# Patient Record
Sex: Male | Born: 1950 | Race: Black or African American | Hispanic: No | Marital: Single
Health system: Southern US, Community
[De-identification: ages and names within clinical notes are randomized; demographics above are authoritative.]

## PROBLEM LIST (undated history)

## (undated) DIAGNOSIS — F32A Depression, unspecified: Secondary | ICD-10-CM

## (undated) DIAGNOSIS — I639 Cerebral infarction, unspecified: Secondary | ICD-10-CM

## (undated) DIAGNOSIS — D649 Anemia, unspecified: Secondary | ICD-10-CM

## (undated) DIAGNOSIS — F329 Major depressive disorder, single episode, unspecified: Secondary | ICD-10-CM

## (undated) DIAGNOSIS — I10 Essential (primary) hypertension: Secondary | ICD-10-CM

## (undated) DIAGNOSIS — H5440 Blindness, one eye, unspecified eye: Secondary | ICD-10-CM

## (undated) DIAGNOSIS — I509 Heart failure, unspecified: Secondary | ICD-10-CM

## (undated) DIAGNOSIS — R569 Unspecified convulsions: Secondary | ICD-10-CM

## (undated) DIAGNOSIS — E119 Type 2 diabetes mellitus without complications: Secondary | ICD-10-CM

## (undated) HISTORY — DX: Blindness, one eye, unspecified eye: H54.40

## (undated) HISTORY — DX: Type 2 diabetes mellitus without complications: E11.9

## (undated) HISTORY — DX: Unspecified convulsions: R56.9

## (undated) HISTORY — DX: Heart failure, unspecified: I50.9

## (undated) HISTORY — DX: Depression, unspecified: F32.A

## (undated) HISTORY — DX: Anemia, unspecified: D64.9

## (undated) HISTORY — DX: Essential (primary) hypertension: I10

## (undated) HISTORY — PX: PEG TUBE PLACEMENT: SUR1034

## (undated) HISTORY — DX: Cerebral infarction, unspecified: I63.9

## (undated) HISTORY — DX: Major depressive disorder, single episode, unspecified: F32.9

---

## 2009-12-17 ENCOUNTER — Ambulatory Visit: Payer: Self-pay | Admitting: Thoracic Surgery (Cardiothoracic Vascular Surgery)

## 2015-02-15 ENCOUNTER — Non-Acute Institutional Stay (SKILLED_NURSING_FACILITY): Payer: Medicare Other | Admitting: Adult Health

## 2015-02-15 ENCOUNTER — Encounter: Payer: Self-pay | Admitting: *Deleted

## 2015-02-15 ENCOUNTER — Encounter: Payer: Self-pay | Admitting: Adult Health

## 2015-02-15 DIAGNOSIS — R1310 Dysphagia, unspecified: Secondary | ICD-10-CM

## 2015-02-15 DIAGNOSIS — Z8673 Personal history of transient ischemic attack (TIA), and cerebral infarction without residual deficits: Secondary | ICD-10-CM

## 2015-02-15 DIAGNOSIS — Z794 Long term (current) use of insulin: Secondary | ICD-10-CM

## 2015-02-15 DIAGNOSIS — I82401 Acute embolism and thrombosis of unspecified deep veins of right lower extremity: Secondary | ICD-10-CM

## 2015-02-15 DIAGNOSIS — F32A Depression, unspecified: Secondary | ICD-10-CM

## 2015-02-15 DIAGNOSIS — I10 Essential (primary) hypertension: Secondary | ICD-10-CM

## 2015-02-15 DIAGNOSIS — E785 Hyperlipidemia, unspecified: Secondary | ICD-10-CM | POA: Diagnosis not present

## 2015-02-15 DIAGNOSIS — R569 Unspecified convulsions: Secondary | ICD-10-CM | POA: Diagnosis not present

## 2015-02-15 DIAGNOSIS — E119 Type 2 diabetes mellitus without complications: Secondary | ICD-10-CM | POA: Diagnosis not present

## 2015-02-15 DIAGNOSIS — F329 Major depressive disorder, single episode, unspecified: Secondary | ICD-10-CM | POA: Diagnosis not present

## 2015-02-15 DIAGNOSIS — I5022 Chronic systolic (congestive) heart failure: Secondary | ICD-10-CM | POA: Diagnosis not present

## 2015-02-16 ENCOUNTER — Non-Acute Institutional Stay (SKILLED_NURSING_FACILITY): Payer: Medicare Other | Admitting: Internal Medicine

## 2015-02-16 DIAGNOSIS — E785 Hyperlipidemia, unspecified: Secondary | ICD-10-CM

## 2015-02-16 DIAGNOSIS — Z794 Long term (current) use of insulin: Secondary | ICD-10-CM

## 2015-02-16 DIAGNOSIS — IMO0002 Reserved for concepts with insufficient information to code with codable children: Secondary | ICD-10-CM

## 2015-02-16 DIAGNOSIS — K651 Peritoneal abscess: Secondary | ICD-10-CM | POA: Diagnosis not present

## 2015-02-16 DIAGNOSIS — E46 Unspecified protein-calorie malnutrition: Secondary | ICD-10-CM | POA: Diagnosis not present

## 2015-02-16 DIAGNOSIS — L89151 Pressure ulcer of sacral region, stage 1: Secondary | ICD-10-CM | POA: Diagnosis not present

## 2015-02-16 DIAGNOSIS — Z79899 Other long term (current) drug therapy: Secondary | ICD-10-CM

## 2015-02-16 DIAGNOSIS — Z789 Other specified health status: Secondary | ICD-10-CM

## 2015-02-16 DIAGNOSIS — Z9189 Other specified personal risk factors, not elsewhere classified: Secondary | ICD-10-CM

## 2015-02-16 DIAGNOSIS — R5381 Other malaise: Secondary | ICD-10-CM

## 2015-02-16 DIAGNOSIS — E1159 Type 2 diabetes mellitus with other circulatory complications: Secondary | ICD-10-CM

## 2015-02-16 DIAGNOSIS — G9340 Encephalopathy, unspecified: Secondary | ICD-10-CM | POA: Diagnosis not present

## 2015-02-16 DIAGNOSIS — I5020 Unspecified systolic (congestive) heart failure: Secondary | ICD-10-CM

## 2015-02-16 DIAGNOSIS — L98429 Non-pressure chronic ulcer of back with unspecified severity: Secondary | ICD-10-CM

## 2015-02-16 DIAGNOSIS — I10 Essential (primary) hypertension: Secondary | ICD-10-CM

## 2015-02-16 DIAGNOSIS — I69391 Dysphagia following cerebral infarction: Secondary | ICD-10-CM

## 2015-02-16 DIAGNOSIS — I824Z1 Acute embolism and thrombosis of unspecified deep veins of right distal lower extremity: Secondary | ICD-10-CM

## 2015-02-16 DIAGNOSIS — I639 Cerebral infarction, unspecified: Secondary | ICD-10-CM

## 2015-02-16 DIAGNOSIS — F0631 Mood disorder due to known physiological condition with depressive features: Secondary | ICD-10-CM

## 2015-02-16 DIAGNOSIS — G40909 Epilepsy, unspecified, not intractable, without status epilepticus: Secondary | ICD-10-CM

## 2015-02-16 NOTE — Progress Notes (Signed)
Patient ID: Dalton Ferguson, male   DOB: 25-Oct-1950, 64 y.o.   MRN: [id number]     [facility]   PCP: No primary care provider on file.  Code Status: full code  No Known Allergies  Chief Complaint  Patient presents with  . New Admit To SNF     HPI:  64 y.o. patient is here for short term rehabilitation from [facility]. He was there from 01/22/15-02/14/15 with generalized weakness, acute respiratory failure, poor nutrition and wounds. 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 TEE showed EF of 30-35%, carotid ultrasound did not show significant stenosis and arrythmia was ruled out. His stroke was felt to be cardioembolic in origin and he was started on anticoagulation. Further workup revelaed right lower extremity DVT. He had significant encephalopathy and was diagnosed to have seizure as well. He had a peg tube in place for nutritional support and later developed abscess around it which had to be drained. He was on vapotherm at 18 ml/min with fio2 35% on transfer to [facility] where he was then weaned down further to room air. He remained encephalopathic at [facility]. He is seen in his room today. He is unable to participate in HPI and ROS. He is on peg tube feed. He has PMH of DM, HLD, CAD, CHF and left eye blindness.   Review of Systems:  Unable to obtain  Past Medical History  Diagnosis Date  . Diabetes mellitus without complication (HCC)   . CHF (congestive heart failure) (HCC)   . Anemia   . Stroke (HCC)   . Blindness of left eye   . Hypertension   . Depression   . Seizures [facility])    Past Surgical History  Procedure Laterality Date  . Peg tube placement     Social History:   has no tobacco, alcohol, and drug history on file.  No family history on file.  Medications:     Medication List       This list is accurate as of: 02/16/15  9:45 AM.  Always use your most recent med list.               atorvastatin 80 MG tablet  Commonly known as:  LIPITOR  80 mg by PEG Tube route at bedtime.     ELIQUIS 5 MG Tabs tablet  Generic drug:  apixaban  5 mg by PEG Tube route 2 (two) times daily.     famotidine 20 MG tablet  Commonly known as:  PEPCID  20 mg by PEG Tube route 2 (two) times daily.     FLUoxetine 40 MG capsule  Commonly known as:  PROZAC  40 mg by PEG Tube route daily.     hydrALAZINE 25 MG tablet  Commonly known as:  APRESOLINE  25 mg by PEG Tube route 3 (three) times daily.     isosorbide dinitrate 10 MG tablet  Commonly known as:  ISORDIL  10 mg by PEG Tube route 3 (three) times daily.     LACTOBACILLUS PO  2 tablets by PEG Tube route 2 (two) times daily.     LANTUS [place]  Inject 40 Units into the skin at bedtime.     levETIRAcetam 500 MG tablet  Commonly known as:  KEPPRA  500 mg by PEG Tube route 2 (two) times daily.  Metoprolol Tartrate 75 MG Tabs  1 tablet by PEG Tube route every 8 (eight) hours.         Physical Exam: Filed Vitals:   02/16/15 0944  BP: 136/82  Pulse: 70  Temp: 98.3 F (36.8 C)  Resp: 16  SpO2: 98%    General- elderly male, in no acute distress Head- normocephalic, atraumatic Nose- normal nasal mucosa, no maxillary or frontal sinus tenderness, no nasal discharge Throat- moist mucus membrane, halitosis  Eyes- no pallor, no icterus, no discharge, normal conjunctiva, normal sclera Neck- no cervical lymphadenopathy Cardiovascular- normal s1,s2, systolic murmurs, trace leg edema Respiratory- bilateral decreased air entry to bases, no wheeze, no rhonchi, no crackles, no use of accessory muscles Abdomen- bowel sounds present, soft, non tender, peg tube in place Musculoskeletal- unable to assess as not following commands Neurological- unable to assess Skin- warm and dry, 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 Skin care. Encourage to be out of bed. Poor overall prognosis given his ongoing encephalopathy. If no improvement with therapy team, will need to review goals of care and get palliative care consult  Encephalopathy Ongoing. To work with SLP for cognition as tolerated. Monitor labs  Acute CVA Continue BP medication, statin and elqiuis for now. Remains encephalopathic. Has poor nutrition. monitor  Protein calorie malnutrition Continue jevity tube feed and continue peg site care, monitor weight, dietary team to be working with the patient. Add air mattress given his decreased mobility and continue pressure ulcer prophylaxis  Dysphagia Aspiration precautions, tube feed for now, oral hygiene  Skin abscess Has been drainaed and packing in place, continue daily wound care for now  Poor oral hygiene Add peridex mouthwash and to provide oral hygiene bid  HTN Stable bp reading. Continue hydralazine 25 mg tid, isosorbide dinitrate 10 mg tid and metoprolol 75 mg tid and monitor BP  CHF EF 30-35%, currently appears euvolemic. Continue isosorbide dinitrate, hydralazine, metoprolol  Right lower extremity DVT Continue eliquis 5 mg bid  HLD Continue atorvastatin 80 mg daily  Stress ulcer prophylaxis Continue famotidine 20 mg bid  Depression Continue fluoxetine 40 mg daily  Stage 1 sacral wound Continue wound care and pressure ulcer prophylaxis  Seizure disorder No recent seizure. Continue keppra 500 mg bid  DM monitor cbg, continue lantus 40 u at bedtime  Goals of care:  short term rehabilitation but possible long term care   Labs/tests ordered: cbc, cmp, a1c, lipid panel  Family/ staff Communication: reviewed care plan with nursing supervisor    Oneal Grout, MD  [facility] [phone] (Monday-Friday 8 am - 5 pm) [phone] (afterhours)

## 2015-05-06 DIAGNOSIS — G8929 Other chronic pain: Secondary | ICD-10-CM | POA: Diagnosis not present

## 2015-05-06 DIAGNOSIS — G40909 Epilepsy, unspecified, not intractable, without status epilepticus: Secondary | ICD-10-CM | POA: Diagnosis not present

## 2015-05-06 DIAGNOSIS — M6259 Muscle wasting and atrophy, not elsewhere classified, multiple sites: Secondary | ICD-10-CM | POA: Diagnosis not present

## 2015-05-06 DIAGNOSIS — G8194 Hemiplegia, unspecified affecting left nondominant side: Secondary | ICD-10-CM | POA: Diagnosis not present

## 2015-05-06 DIAGNOSIS — I69391 Dysphagia following cerebral infarction: Secondary | ICD-10-CM | POA: Diagnosis not present

## 2015-05-06 DIAGNOSIS — Z5189 Encounter for other specified aftercare: Secondary | ICD-10-CM | POA: Diagnosis not present

## 2015-05-06 DIAGNOSIS — I63511 Cerebral infarction due to unspecified occlusion or stenosis of right middle cerebral artery: Secondary | ICD-10-CM | POA: Diagnosis not present

## 2015-05-06 DIAGNOSIS — I69328 Other speech and language deficits following cerebral infarction: Secondary | ICD-10-CM | POA: Diagnosis not present

## 2015-05-06 DIAGNOSIS — R278 Other lack of coordination: Secondary | ICD-10-CM | POA: Diagnosis not present

## 2015-05-06 DIAGNOSIS — R5381 Other malaise: Secondary | ICD-10-CM | POA: Diagnosis not present

## 2015-05-06 DIAGNOSIS — L8942 Pressure ulcer of contiguous site of back, buttock and hip, stage 2: Secondary | ICD-10-CM | POA: Diagnosis not present

## 2015-05-06 DIAGNOSIS — E785 Hyperlipidemia, unspecified: Secondary | ICD-10-CM | POA: Diagnosis not present

## 2015-05-06 DIAGNOSIS — M6281 Muscle weakness (generalized): Secondary | ICD-10-CM | POA: Diagnosis not present

## 2015-05-06 DIAGNOSIS — M6249 Contracture of muscle, multiple sites: Secondary | ICD-10-CM | POA: Diagnosis not present

## 2015-05-06 DIAGNOSIS — Z8673 Personal history of transient ischemic attack (TIA), and cerebral infarction without residual deficits: Secondary | ICD-10-CM | POA: Diagnosis not present

## 2015-05-06 DIAGNOSIS — E1159 Type 2 diabetes mellitus with other circulatory complications: Secondary | ICD-10-CM | POA: Diagnosis not present

## 2015-05-06 DIAGNOSIS — R262 Difficulty in walking, not elsewhere classified: Secondary | ICD-10-CM | POA: Diagnosis not present

## 2015-05-06 DIAGNOSIS — I10 Essential (primary) hypertension: Secondary | ICD-10-CM | POA: Diagnosis not present

## 2015-05-06 DIAGNOSIS — I824Z1 Acute embolism and thrombosis of unspecified deep veins of right distal lower extremity: Secondary | ICD-10-CM | POA: Diagnosis not present

## 2015-05-06 DIAGNOSIS — L89159 Pressure ulcer of sacral region, unspecified stage: Secondary | ICD-10-CM | POA: Diagnosis not present

## 2015-05-06 DIAGNOSIS — M79609 Pain in unspecified limb: Secondary | ICD-10-CM | POA: Diagnosis not present

## 2015-05-06 DIAGNOSIS — I69320 Aphasia following cerebral infarction: Secondary | ICD-10-CM | POA: Diagnosis not present

## 2015-05-06 DIAGNOSIS — J9601 Acute respiratory failure with hypoxia: Secondary | ICD-10-CM | POA: Diagnosis not present

## 2015-05-06 DIAGNOSIS — I5020 Unspecified systolic (congestive) heart failure: Secondary | ICD-10-CM | POA: Diagnosis not present

## 2015-05-06 DIAGNOSIS — G8191 Hemiplegia, unspecified affecting right dominant side: Secondary | ICD-10-CM | POA: Diagnosis not present

## 2015-05-06 DIAGNOSIS — I63431 Cerebral infarction due to embolism of right posterior cerebral artery: Secondary | ICD-10-CM | POA: Diagnosis not present

## 2015-05-06 DIAGNOSIS — I63512 Cerebral infarction due to unspecified occlusion or stenosis of left middle cerebral artery: Secondary | ICD-10-CM | POA: Diagnosis not present

## 2015-05-06 DIAGNOSIS — G9340 Encephalopathy, unspecified: Secondary | ICD-10-CM | POA: Diagnosis not present

## 2015-05-06 DIAGNOSIS — F329 Major depressive disorder, single episode, unspecified: Secondary | ICD-10-CM | POA: Diagnosis not present

## 2015-05-06 DIAGNOSIS — E46 Unspecified protein-calorie malnutrition: Secondary | ICD-10-CM | POA: Diagnosis not present

## 2015-05-06 DIAGNOSIS — I63412 Cerebral infarction due to embolism of left middle cerebral artery: Secondary | ICD-10-CM | POA: Diagnosis not present

## 2015-05-06 DIAGNOSIS — I63411 Cerebral infarction due to embolism of right middle cerebral artery: Secondary | ICD-10-CM | POA: Diagnosis not present

## 2015-05-06 DIAGNOSIS — I639 Cerebral infarction, unspecified: Secondary | ICD-10-CM | POA: Diagnosis not present

## 2015-05-06 DIAGNOSIS — L89151 Pressure ulcer of sacral region, stage 1: Secondary | ICD-10-CM | POA: Diagnosis not present

## 2015-05-06 DIAGNOSIS — D638 Anemia in other chronic diseases classified elsewhere: Secondary | ICD-10-CM | POA: Diagnosis not present

## 2015-05-08 DIAGNOSIS — G9340 Encephalopathy, unspecified: Secondary | ICD-10-CM | POA: Diagnosis not present

## 2015-05-08 DIAGNOSIS — I69320 Aphasia following cerebral infarction: Secondary | ICD-10-CM | POA: Diagnosis not present

## 2015-05-08 DIAGNOSIS — I63411 Cerebral infarction due to embolism of right middle cerebral artery: Secondary | ICD-10-CM | POA: Diagnosis not present

## 2015-05-08 DIAGNOSIS — R5381 Other malaise: Secondary | ICD-10-CM | POA: Diagnosis not present

## 2015-05-08 DIAGNOSIS — Z5189 Encounter for other specified aftercare: Secondary | ICD-10-CM | POA: Diagnosis not present

## 2015-05-08 DIAGNOSIS — I63412 Cerebral infarction due to embolism of left middle cerebral artery: Secondary | ICD-10-CM | POA: Diagnosis not present

## 2015-05-08 DIAGNOSIS — L89151 Pressure ulcer of sacral region, stage 1: Secondary | ICD-10-CM | POA: Diagnosis not present

## 2015-05-09 ENCOUNTER — Encounter: Payer: Self-pay | Admitting: Adult Health

## 2015-05-09 ENCOUNTER — Non-Acute Institutional Stay (SKILLED_NURSING_FACILITY): Payer: Medicare Other | Admitting: Adult Health

## 2015-05-09 DIAGNOSIS — D638 Anemia in other chronic diseases classified elsewhere: Secondary | ICD-10-CM | POA: Diagnosis not present

## 2015-05-09 DIAGNOSIS — I5020 Unspecified systolic (congestive) heart failure: Secondary | ICD-10-CM | POA: Diagnosis not present

## 2015-05-09 DIAGNOSIS — Z8673 Personal history of transient ischemic attack (TIA), and cerebral infarction without residual deficits: Secondary | ICD-10-CM | POA: Diagnosis not present

## 2015-05-09 DIAGNOSIS — I824Z1 Acute embolism and thrombosis of unspecified deep veins of right distal lower extremity: Secondary | ICD-10-CM

## 2015-05-09 DIAGNOSIS — E785 Hyperlipidemia, unspecified: Secondary | ICD-10-CM

## 2015-05-09 DIAGNOSIS — I69391 Dysphagia following cerebral infarction: Secondary | ICD-10-CM | POA: Diagnosis not present

## 2015-05-09 DIAGNOSIS — L89159 Pressure ulcer of sacral region, unspecified stage: Secondary | ICD-10-CM | POA: Diagnosis not present

## 2015-05-09 DIAGNOSIS — IMO0002 Reserved for concepts with insufficient information to code with codable children: Secondary | ICD-10-CM

## 2015-05-09 DIAGNOSIS — F0631 Mood disorder due to known physiological condition with depressive features: Secondary | ICD-10-CM

## 2015-05-09 DIAGNOSIS — E1159 Type 2 diabetes mellitus with other circulatory complications: Secondary | ICD-10-CM | POA: Diagnosis not present

## 2015-05-09 DIAGNOSIS — Z794 Long term (current) use of insulin: Secondary | ICD-10-CM

## 2015-05-09 DIAGNOSIS — E46 Unspecified protein-calorie malnutrition: Secondary | ICD-10-CM

## 2015-05-09 DIAGNOSIS — I639 Cerebral infarction, unspecified: Secondary | ICD-10-CM

## 2015-05-09 DIAGNOSIS — I10 Essential (primary) hypertension: Secondary | ICD-10-CM | POA: Diagnosis not present

## 2015-05-09 DIAGNOSIS — G40909 Epilepsy, unspecified, not intractable, without status epilepticus: Secondary | ICD-10-CM | POA: Diagnosis not present

## 2015-05-09 NOTE — Progress Notes (Addendum)
Patient ID: Dalton Ferguson, male   DOB: 1950-11-24, 65 y.o.   MRN: [id number]    DATE:  05/09/2015   MRN:  [id number]  BIRTHDAY: 1950/09/24  Facility:  Nursing Home Location:  [facility]  Nursing Home Room Number: 506-1  LEVEL OF CARE:  SNF [phone])  Contact Information    Name Relation Home Work [place]  [phone]     [facility]  [phone]     Towanda Malkin Daughter   [phone]   Tollie Pizza Daughter   [phone]        Chief Complaint  Patient presents with  . Medical Management of Chronic Issues    CVA history, protein calorie malnutrition, dysphagia, DVT RLE, pressure ulcer sacral region, hyperlipidemia, hypertension, systolic CHF, seizure, major depression, diabetes mellitus type 2 and anemia of chronic disease    HISTORY OF PRESENT ILLNESS:  This is a 65 year old male who is being seen for a routine visit. He is a long-term care resident of [facility]. He has been admitted to [facility] on 02/14/15. He has been admitted from [facility]. Patient has been having watery stools causing moisture on sacral area. He is on continuous tube feedings and needing HOB elevated causing pressure on sacral area. No seizure episode and currently on Keppra. Latest hgbA1c 7.1 andcurrently on Lantus 40 units SQ daily.  PAST MEDICAL HISTORY:  Past Medical History  Diagnosis Date  . Diabetes mellitus without complication (HCC)   . CHF (congestive heart failure) (HCC)   . Anemia   . Stroke (HCC)   . Blindness of left eye   . Hypertension   . Depression   . Seizures (HCC)      CURRENT MEDICATIONS: Reviewed  Patient's Medications  New Prescriptions   No medications on file  Previous Medications   APIXABAN (ELIQUIS) 5 MG TABS TABLET    5 mg by PEG Tube route 2 (two) times daily.   ATORVASTATIN (LIPITOR) 80 MG TABLET    80 mg by PEG Tube route at bedtime.   FAMOTIDINE (PEPCID) 20 MG TABLET    20 mg by PEG Tube route 2  (two) times daily.   FLUOXETINE (PROZAC) 20 MG/5ML SOLUTION    Place 40 mg into feeding tube daily. Take 40 mg/10 ml via peg daily   HYDRALAZINE (APRESOLINE) 25 MG TABLET    25 mg by PEG Tube route 3 (three) times daily.   INSULIN GLARGINE (LANTUS [place])    Inject 40 Units into the skin at bedtime.   ISOSORBIDE DINITRATE (ISORDIL) 10 MG TABLET    10 mg by PEG Tube route 3 (three) times daily.   LACTOBACILLUS PO    2 tablets by PEG Tube route 2 (two) times daily.   LEVETIRACETAM (KEPPRA) 500 MG TABLET    500 mg by PEG Tube route 2 (two) times daily.   METOPROLOL TARTRATE 75 MG TABS    1 tablet by PEG Tube route every 8 (eight) hours.  Modified Medications   No medications on file  Discontinued Medications   FLUOXETINE (PROZAC) 40 MG CAPSULE    40 mg by PEG Tube route daily.     No Known Allergies   REVIEW OF SYSTEMS:  Unable to obtain due to patient being nonverbal  PHYSICAL EXAMINATION  GENERAL APPEARANCE: In no acute distress.  SKIN:  Sacral pressure ulcer; left eye is blind HEAD: Normal in size and contour. No evidence of trauma EYES: right pupil is reactive to light, left  eye is blind EARS: Pinnae are normal. Patient hears normal voice tunes of the examiner MOUTH and THROAT: Lips are without lesions. Oral mucosa is moist and without lesions. Tongue is normal in shape, size, and color and without lesions NECK: supple, trachea midline, no neck masses, no thyroid tenderness, no thyromegaly LYMPHATICS: no LAN in the neck, no supraclavicular LAN RESPIRATORY: breathing is even & unlabored, BS CTAB CARDIAC: RRR, no murmur,no extra heart sounds, no edema GI: abdomen soft, normal BS, no masses, no tenderness, no hepatomegaly, no splenomegaly EXTREMITIES:  Able to move LUE from elbow only; not able to move BLE and RUE; contracted right hand PSYCHIATRIC: Affect and behavior are appropriate  LABS/RADIOLOGY: Labs reviewed: 03/06/15  iron, total 40  iron, unsaturated 154  total iron binding  capacity 194  saturation iron 21 folate acid 10.95 vitamin B12 851 02/16/15  cholesterol 117 HDL 23.7 LDL 57 triglycerides 180 WBC 10.9 hemoglobin 8.2 hematocrit 26.6 MCV 96.0 platelet 311 sodium 141 potassium 4.9 glucose 291 BUN 31 creatinine 1.18 calcium 8.4 total protein 6.0 albumin 2.54 total bilirubin 0.32 alkaline phosphatase 228 SGOT 35 SGPT 110    ASSESSMENT/PLAN:  CVA history - continue Eliquis, metoprolol, isosorbide dinitrate and hydralazine  Dysphagia - continue tube feedings and aspiration precaution  Protein calorie malnutrition, severe - albumin 2.54; continue Prostat 30 mL twice a day and continue to feedings of Isosource 1.5 at 50 mL/hour; check CMP and RD consult regarding possibility of 16 hour tube feedings and 8 hours off so patient can lie flat on bed to relieve pressure on sacral area  DVT RLE - continue Eliquis 5 mg 1 tab twice a day  Sacral pressure ulcer - continue wound treatment  Hyperlipidemia - continue atorvastatin 80 mg 1 tab daily at bedtime  Hypertension - continue metoprolol tartrate 75 mg every 8 hours, hydralazine 25 mg 3 times a day and isosorbide dinitrate 10 mg 1 tab 3 times a day  Systolic CHF - continue hydralazine, isosorbide dinitrate and metoprolol  Seizure disorder - continue Keppra 100 mg/ML give 500 mg/5 ML twice a day  Major depression - mood is stable; continue Prozac 40 mg/10 mL via G-tube daily  Diabetes mellitus, type II - hemoglobin A1c 7.1; continue Lantus 40 units subcutaneous daily at bedtime  Anemia of chronic disease - hemoglobin 8.2; check CBC    Goals of care:  Long-term care   [facility], NP [facility] [phone]

## 2015-05-10 DIAGNOSIS — M6249 Contracture of muscle, multiple sites: Secondary | ICD-10-CM | POA: Diagnosis not present

## 2015-05-10 DIAGNOSIS — R262 Difficulty in walking, not elsewhere classified: Secondary | ICD-10-CM | POA: Diagnosis not present

## 2015-05-10 DIAGNOSIS — R5381 Other malaise: Secondary | ICD-10-CM | POA: Diagnosis not present

## 2015-05-10 DIAGNOSIS — G8194 Hemiplegia, unspecified affecting left nondominant side: Secondary | ICD-10-CM | POA: Diagnosis not present

## 2015-05-10 DIAGNOSIS — L8942 Pressure ulcer of contiguous site of back, buttock and hip, stage 2: Secondary | ICD-10-CM | POA: Diagnosis not present

## 2015-05-10 DIAGNOSIS — G8191 Hemiplegia, unspecified affecting right dominant side: Secondary | ICD-10-CM | POA: Diagnosis not present

## 2015-05-10 DIAGNOSIS — G8929 Other chronic pain: Secondary | ICD-10-CM | POA: Diagnosis not present

## 2015-05-10 DIAGNOSIS — M6259 Muscle wasting and atrophy, not elsewhere classified, multiple sites: Secondary | ICD-10-CM | POA: Diagnosis not present

## 2015-05-10 DIAGNOSIS — I63511 Cerebral infarction due to unspecified occlusion or stenosis of right middle cerebral artery: Secondary | ICD-10-CM | POA: Diagnosis not present

## 2015-05-10 DIAGNOSIS — F329 Major depressive disorder, single episode, unspecified: Secondary | ICD-10-CM | POA: Diagnosis not present

## 2015-05-10 DIAGNOSIS — I63512 Cerebral infarction due to unspecified occlusion or stenosis of left middle cerebral artery: Secondary | ICD-10-CM | POA: Diagnosis not present

## 2015-05-10 DIAGNOSIS — M79609 Pain in unspecified limb: Secondary | ICD-10-CM | POA: Diagnosis not present

## 2015-05-16 DIAGNOSIS — M6259 Muscle wasting and atrophy, not elsewhere classified, multiple sites: Secondary | ICD-10-CM | POA: Diagnosis not present

## 2015-05-16 DIAGNOSIS — L8942 Pressure ulcer of contiguous site of back, buttock and hip, stage 2: Secondary | ICD-10-CM | POA: Diagnosis not present

## 2015-05-16 DIAGNOSIS — I63512 Cerebral infarction due to unspecified occlusion or stenosis of left middle cerebral artery: Secondary | ICD-10-CM | POA: Diagnosis not present

## 2015-05-16 DIAGNOSIS — F329 Major depressive disorder, single episode, unspecified: Secondary | ICD-10-CM | POA: Diagnosis not present

## 2015-05-16 DIAGNOSIS — M6249 Contracture of muscle, multiple sites: Secondary | ICD-10-CM | POA: Diagnosis not present

## 2015-05-16 DIAGNOSIS — M79609 Pain in unspecified limb: Secondary | ICD-10-CM | POA: Diagnosis not present

## 2015-05-16 DIAGNOSIS — G8929 Other chronic pain: Secondary | ICD-10-CM | POA: Diagnosis not present

## 2015-05-16 DIAGNOSIS — R5381 Other malaise: Secondary | ICD-10-CM | POA: Diagnosis not present

## 2015-05-16 DIAGNOSIS — R262 Difficulty in walking, not elsewhere classified: Secondary | ICD-10-CM | POA: Diagnosis not present

## 2015-05-16 DIAGNOSIS — I63511 Cerebral infarction due to unspecified occlusion or stenosis of right middle cerebral artery: Secondary | ICD-10-CM | POA: Diagnosis not present

## 2015-05-16 DIAGNOSIS — G8194 Hemiplegia, unspecified affecting left nondominant side: Secondary | ICD-10-CM | POA: Diagnosis not present

## 2015-05-16 DIAGNOSIS — G8191 Hemiplegia, unspecified affecting right dominant side: Secondary | ICD-10-CM | POA: Diagnosis not present

## 2015-05-18 DIAGNOSIS — F329 Major depressive disorder, single episode, unspecified: Secondary | ICD-10-CM | POA: Diagnosis not present

## 2015-05-18 DIAGNOSIS — M79609 Pain in unspecified limb: Secondary | ICD-10-CM | POA: Diagnosis not present

## 2015-05-18 DIAGNOSIS — I63512 Cerebral infarction due to unspecified occlusion or stenosis of left middle cerebral artery: Secondary | ICD-10-CM | POA: Diagnosis not present

## 2015-05-18 DIAGNOSIS — L8942 Pressure ulcer of contiguous site of back, buttock and hip, stage 2: Secondary | ICD-10-CM | POA: Diagnosis not present

## 2015-05-18 DIAGNOSIS — M6259 Muscle wasting and atrophy, not elsewhere classified, multiple sites: Secondary | ICD-10-CM | POA: Diagnosis not present

## 2015-05-18 DIAGNOSIS — R262 Difficulty in walking, not elsewhere classified: Secondary | ICD-10-CM | POA: Diagnosis not present

## 2015-05-18 DIAGNOSIS — G8191 Hemiplegia, unspecified affecting right dominant side: Secondary | ICD-10-CM | POA: Diagnosis not present

## 2015-05-18 DIAGNOSIS — G8929 Other chronic pain: Secondary | ICD-10-CM | POA: Diagnosis not present

## 2015-05-18 DIAGNOSIS — I63511 Cerebral infarction due to unspecified occlusion or stenosis of right middle cerebral artery: Secondary | ICD-10-CM | POA: Diagnosis not present

## 2015-05-18 DIAGNOSIS — R5381 Other malaise: Secondary | ICD-10-CM | POA: Diagnosis not present

## 2015-05-18 DIAGNOSIS — G8194 Hemiplegia, unspecified affecting left nondominant side: Secondary | ICD-10-CM | POA: Diagnosis not present

## 2015-05-18 DIAGNOSIS — M6249 Contracture of muscle, multiple sites: Secondary | ICD-10-CM | POA: Diagnosis not present

## 2015-05-20 DIAGNOSIS — L8942 Pressure ulcer of contiguous site of back, buttock and hip, stage 2: Secondary | ICD-10-CM | POA: Diagnosis not present

## 2015-05-20 DIAGNOSIS — G8191 Hemiplegia, unspecified affecting right dominant side: Secondary | ICD-10-CM | POA: Diagnosis not present

## 2015-05-20 DIAGNOSIS — M6249 Contracture of muscle, multiple sites: Secondary | ICD-10-CM | POA: Diagnosis not present

## 2015-05-20 DIAGNOSIS — M6259 Muscle wasting and atrophy, not elsewhere classified, multiple sites: Secondary | ICD-10-CM | POA: Diagnosis not present

## 2015-05-20 DIAGNOSIS — I63511 Cerebral infarction due to unspecified occlusion or stenosis of right middle cerebral artery: Secondary | ICD-10-CM | POA: Diagnosis not present

## 2015-05-20 DIAGNOSIS — G8194 Hemiplegia, unspecified affecting left nondominant side: Secondary | ICD-10-CM | POA: Diagnosis not present

## 2015-05-20 DIAGNOSIS — R5381 Other malaise: Secondary | ICD-10-CM | POA: Diagnosis not present

## 2015-05-20 DIAGNOSIS — M79609 Pain in unspecified limb: Secondary | ICD-10-CM | POA: Diagnosis not present

## 2015-05-20 DIAGNOSIS — G8929 Other chronic pain: Secondary | ICD-10-CM | POA: Diagnosis not present

## 2015-05-20 DIAGNOSIS — F329 Major depressive disorder, single episode, unspecified: Secondary | ICD-10-CM | POA: Diagnosis not present

## 2015-05-20 DIAGNOSIS — I63512 Cerebral infarction due to unspecified occlusion or stenosis of left middle cerebral artery: Secondary | ICD-10-CM | POA: Diagnosis not present

## 2015-05-20 DIAGNOSIS — R262 Difficulty in walking, not elsewhere classified: Secondary | ICD-10-CM | POA: Diagnosis not present

## 2015-05-22 ENCOUNTER — Non-Acute Institutional Stay (SKILLED_NURSING_FACILITY): Payer: Medicare Other | Admitting: Adult Health

## 2015-05-22 ENCOUNTER — Encounter: Payer: Self-pay | Admitting: Adult Health

## 2015-05-22 DIAGNOSIS — Z86718 Personal history of other venous thrombosis and embolism: Secondary | ICD-10-CM | POA: Insufficient documentation

## 2015-05-22 DIAGNOSIS — D638 Anemia in other chronic diseases classified elsewhere: Secondary | ICD-10-CM | POA: Diagnosis not present

## 2015-05-22 DIAGNOSIS — I5020 Unspecified systolic (congestive) heart failure: Secondary | ICD-10-CM | POA: Diagnosis not present

## 2015-05-22 DIAGNOSIS — I69391 Dysphagia following cerebral infarction: Secondary | ICD-10-CM

## 2015-05-22 DIAGNOSIS — G40909 Epilepsy, unspecified, not intractable, without status epilepticus: Secondary | ICD-10-CM

## 2015-05-22 DIAGNOSIS — I10 Essential (primary) hypertension: Secondary | ICD-10-CM | POA: Diagnosis not present

## 2015-05-22 DIAGNOSIS — Z8673 Personal history of transient ischemic attack (TIA), and cerebral infarction without residual deficits: Secondary | ICD-10-CM | POA: Insufficient documentation

## 2015-05-22 DIAGNOSIS — I824Z1 Acute embolism and thrombosis of unspecified deep veins of right distal lower extremity: Secondary | ICD-10-CM | POA: Diagnosis not present

## 2015-05-22 DIAGNOSIS — R569 Unspecified convulsions: Secondary | ICD-10-CM | POA: Insufficient documentation

## 2015-05-22 DIAGNOSIS — F32A Depression, unspecified: Secondary | ICD-10-CM | POA: Insufficient documentation

## 2015-05-22 DIAGNOSIS — E785 Hyperlipidemia, unspecified: Secondary | ICD-10-CM | POA: Diagnosis not present

## 2015-05-22 DIAGNOSIS — E1159 Type 2 diabetes mellitus with other circulatory complications: Secondary | ICD-10-CM

## 2015-05-22 DIAGNOSIS — I639 Cerebral infarction, unspecified: Secondary | ICD-10-CM | POA: Diagnosis not present

## 2015-05-22 DIAGNOSIS — F329 Major depressive disorder, single episode, unspecified: Secondary | ICD-10-CM | POA: Insufficient documentation

## 2015-05-22 DIAGNOSIS — F0631 Mood disorder due to known physiological condition with depressive features: Secondary | ICD-10-CM

## 2015-05-22 DIAGNOSIS — E46 Unspecified protein-calorie malnutrition: Secondary | ICD-10-CM | POA: Diagnosis not present

## 2015-05-22 DIAGNOSIS — Z794 Long term (current) use of insulin: Secondary | ICD-10-CM

## 2015-05-22 DIAGNOSIS — IMO0002 Reserved for concepts with insufficient information to code with codable children: Secondary | ICD-10-CM

## 2015-05-22 DIAGNOSIS — E119 Type 2 diabetes mellitus without complications: Secondary | ICD-10-CM | POA: Insufficient documentation

## 2015-05-22 DIAGNOSIS — L89159 Pressure ulcer of sacral region, unspecified stage: Secondary | ICD-10-CM

## 2015-05-22 NOTE — Progress Notes (Addendum)
Patient ID: Dalton Ferguson, male   DOB: [birth year], 65 y.o.   MRN: [id number]    DATE:    05/22/15  MRN:  [id number]  BIRTHDAY: 04/06/1951  Facility:  Nursing Home Location:  [facility]  Nursing Home Room Number: [id number]  LEVEL OF CARE:  SNF [phone])  Contact Information    Name Relation Home Work [place]  [phone]     [facility]  [phone]     Towanda Malkin Daughter   [phone]   Tollie Pizza Daughter   [phone]        Chief Complaint  Patient presents with  . Discharge Notes    CVA history, protein calorie malnutrition, dysphagia, DVT RLE, pressure ulcer sacral region, hyperlipidemia, hypertension, systolic CHF, seizure, major depression, diabetes mellitus type 2 and anemia of chronic disease    HISTORY OF PRESENT ILLNESS:  This is a 65 year old male who is for discharge home with Home health PT for endurance, OT for ADLs, CNA for showers, skilled nurse for disease management and social worker for advocacy. DME:  Michiel Sites lift, semi-electric hospital bed, gel overlay mattress, geri-chair, Diabetic source @75  cc/hour X 16 hours per day via pump (substitution permitted).  Daughter is adamant to take patient home and verbalized that she would be able to take care of his father. He has been admitted to [facility] on 02/14/15. He has been admitted from [facility]. He was there from 01/22/15-02/14/15 with generalized weakness, acute respiratory failure, poor nutrition and wounds. 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 TEE showed EF of 30-35%, carotid ultrasound did not show significant stenosis and arrythmia was ruled out. His stroke was felt to be cardioembolic in origin and he was started on anticoagulation. Further workup revelaed right lower extremity DVT. He had significant  encephalopathy and was diagnosed to have seizure as well. He had a peg tube in place for nutritional support.  He remained encephalopathic at [facility].   He has been admitted for a short-term rehabilitation and transitioned to long-term resident. He is bed-bound and is a total care.   PAST MEDICAL HISTORY:  Past Medical History  Diagnosis Date  . Diabetes mellitus without complication (HCC)   . CHF (congestive heart failure) (HCC)   . Anemia   . Stroke (HCC)   . Blindness of left eye   . Hypertension   . Depression   . Seizures (HCC)      CURRENT MEDICATIONS: Reviewed    Medication List       This list is accurate as of: 05/22/15  9:07 PM.  Always use your most recent med list.               atorvastatin 80 MG tablet  Commonly known as:  LIPITOR  80 mg by PEG Tube route at bedtime.     baclofen 10 MG tablet  Commonly known as:  [place]ES[place]  Take 10 mg by mouth 3 (three) times daily. Take 1/2 tab = 5 mg via PEG TID     ELIQUIS 5 MG Tabs tablet  Generic drug:  apixaban  5 mg by PEG Tube route 2 (two) times daily.     famotidine 20 MG tablet  Commonly known as:  PEPCID  20 mg by PEG Tube route 2 (two) times daily.     FLUoxetine 20 MG/5ML solution  Commonly known as:  PROZAC  Place 40 mg into feeding tube daily. Take 40 mg/10 ml via peg daily     hydrALAZINE 25 MG tablet  Commonly known as:  APRESOLINE  25 mg by PEG Tube route 3 (three) times daily.     isosorbide dinitrate 10 MG tablet  Commonly known as:  ISORDIL  10 mg by PEG Tube route 3 (three) times daily.     LACTOBACILLUS PO  2 tablets by PEG Tube route 2 (two) times daily.     LANTUS [place]  Inject 40 Units into the skin at bedtime.     levETIRAcetam 500 MG tablet  Commonly known as:  KEPPRA  500 mg by PEG Tube route 2 (two) times daily.     Metoprolol Tartrate 75 MG Tabs  1 tablet by PEG Tube route every 8 (eight) hours.     VITAMIN D-3 PO  2,000 Units by PEG Tube route daily.        No Known  Allergies   REVIEW OF SYSTEMS:  Unable to obtain due to patient being nonverbal  PHYSICAL EXAMINATION  GENERAL APPEARANCE: In no acute distress.  SKIN:  Sacral pressure ulcer HEAD: Normal in size and contour. No evidence of trauma EYES: right pupil is reactive to light, left eye is blind.  EARS: Pinnae are normal. Patient hears normal voice tunes of the examiner MOUTH and THROAT: Lips are without lesions. Oral mucosa is moist and without lesions. Tongue is normal in shape, size, and color and without lesions NECK: supple, trachea midline, no neck masses, no thyroid tenderness, no thyromegaly LYMPHATICS: no LAN in the neck, no supraclavicular LAN RESPIRATORY: breathing is even & unlabored, BS CTAB CARDIAC: RRR, no murmur,no extra heart sounds, no edema GI: abdomen soft, normal BS, no masses, no tenderness, no hepatomegaly, no splenomegaly EXTREMITIES:  Able to move LUE from elbow only; not able to move BLE and RUE; contracted right hand PSYCHIATRIC: Affect and behavior are appropriate  LABS/RADIOLOGY: Labs reviewed: 05/10/15  WBC 6.9 hemoglobin 11.3 hematocrit 36.7 MCV 89.3 platelet 298 sodium 137 potassium 4.2 glucose 72 BUN 22 creatinine 0.71 calcium 8.6 total protein 5.7 albumin 2.53 total bilirubin 0.31 alkaline phosphatase 151 SGOT 39 SGPT 74 pre-albumin 24.0 03/06/15  iron, total 40  iron, unsaturated 154  total iron binding capacity 194  saturation iron 21 folate acid 10.95 vitamin B12 851 02/16/15  cholesterol 117 HDL 23.7 LDL 57 triglycerides 180 WBC 10.9 hemoglobin 8.2 hematocrit 26.6 MCV 96.0 platelet 311 sodium 141 potassium 4.9 glucose 291 BUN 31 creatinine 1.18 calcium 8.4 total protein 6.0 albumin 2.54 total bilirubin 0.32 alkaline phosphatase 228 SGOT 35 SGPT 110    ASSESSMENT/PLAN:  CVA history - continue Eliquis, metoprolol, isosorbide dinitrate and hydralazine; for Home health PT, OT, skilled Nurse, CNA and social worker  Dysphagia - continue tube feedings and  aspiration precaution  Protein calorie malnutrition, severe - albumin 2.53; continue Prostat 30 mL twice a day and continue to feedings which was recently changed to Diabetic source @75  cc/hour X 16 hours per day via pump  DVT RLE - continue Eliquis 5 mg 1 tab twice a day  Sacral pressure ulcer - continue wound treatment  Hyperlipidemia - continue atorvastatin 80 mg 1 tab daily at bedtime  Hypertension - continue metoprolol tartrate 75 mg every 8 hours, hydralazine 25 mg 3 times a day and isosorbide dinitrate 10 mg 1 tab 3 times a day  Systolic CHF - continue hydralazine, isosorbide dinitrate and metoprolol  Seizure disorder - continue  Keppra 100 mg/ML give 500 mg/5 ML twice a day  Major depression - mood is stable; continue Prozac 40 mg/10 mL via G-tube daily  Diabetes mellitus, type II - hemoglobin A1c 7.1; continue Lantus 40 units subcutaneous daily at bedtime  Anemia of chronic disease - hemoglobin 8.2; re-check 11.3, improved      I have filled out patient's discharge paperwork and written prescriptions.  Patient will receive home health PT, OT, skilled Nurse, Social worker and CNA.  DME provided:  Michiel Sites lift, semi-electric hospital bed, gel overlay mattress, geri-chair  Total discharge time: Greater than 30 minutes  Discharge time involved coordination of the discharge process with Child psychotherapist, nursing staff and therapy department. Medical justification for home health services/DME verified.   [facility], NP [employer] [phone]

## 2015-05-22 NOTE — Progress Notes (Signed)
Patient ID: Dalton Ferguson, male   DOB: [birth year], 65 y.o.   MRN: [id number]    DATE:  02/15/15  MRN:  [id number]  BIRTHDAY: 03-Nov-1950  Facility:  Nursing Home Location:  [facility]  Nursing Home Room Number: 506-1  LEVEL OF CARE:  SNF [phone])  Contact Information    Name Relation Home Work [place]  [phone]     [facility]  [phone]     Towanda Malkin Daughter   [phone]   Tollie Pizza Daughter   [phone]       Chief Complaint  Patient presents with  . Acute Visit  CVA history, dysphagia, DVT, hyperlipidemia, hypertension, CHF, seizure, depression and diabetes mellitus type 2    HISTORY OF PRESENT ILLNESS:  This is a 65 year old male who has been admitted to [facility] on 02/14/15 from [facility]. 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 TEE showed EF of 30-35%, carotid ultrasound did not show significant stenosis and arrythmia was ruled out. His stroke was felt to be cardioembolic in origin and he was started on anticoagulation. Further workup revelaed right lower extremity DVT. He had significant encephalopathy and was diagnosed to have seizure as well. He had a peg tube in place for nutritional support.  He has been admitted for a short-term rehabilitation.    PAST MEDICAL HISTORY:  Past Medical History  Diagnosis Date  . Diabetes mellitus without complication (HCC)   . CHF (congestive heart failure) (HCC)   . Anemia   . Stroke (HCC)   . Blindness of left eye   . Hypertension   . Depression   . Seizures (HCC)      CURRENT MEDICATIONS:  Reviewed  Patient's Medications  New Prescriptions   No medications on file  Previous Medications   APIXABAN (ELIQUIS) 5 MG TABS TABLET    5 mg by PEG Tube route 2 (two) times daily.   ATORVASTATIN (LIPITOR) 80 MG TABLET    80 mg by PEG Tube route at bedtime.       CHOLECALCIFEROL (VITAMIN D-3 PO)    2,000 Units by PEG Tube route daily.   FAMOTIDINE (PEPCID) 20 MG TABLET    20 mg by PEG Tube route 2 (two) times daily.   FLUOXETINE (PROZAC) 20 MG/5ML SOLUTION    Place 40 mg into feeding tube daily. Take 40 mg/10 ml via peg twice a day   HYDRALAZINE (APRESOLINE) 25 MG TABLET    25 mg by PEG Tube route 3 (three) times daily.   INSULIN GLARGINE (LANTUS [place])    Inject 40 Units into the skin at bedtime.   ISOSORBIDE DINITRATE (ISORDIL) 10 MG TABLET    10 mg by PEG Tube route 3 (three) times daily.   LACTOBACILLUS PO    2 tablets by PEG Tube route 2 (two) times daily.   LEVETIRACETAM (KEPPRA) 500 MG TABLET    500 mg by PEG Tube route 2 (two) times daily.   METOPROLOL TARTRATE 75 MG TABS    1 tablet by PEG Tube route every 8 (eight) hours.                   No Known Allergies  REVIEW OF SYSTEMS:  Unable to obtain; patient is non-verbal    PHYSICAL EXAMINATION  GENERAL APPEARANCE: Well nourished. In no acute distress. Normal body habitus SKIN:  Right knee with abrasion covered with tegaderm HEAD: Normal in size and contour. No evidence of trauma EYES: right pupil is reactive, left eye is blind EARS: Pinnae are normal. Patient hears normal voice tunes of the examiner MOUTH and THROAT: Lips are without lesions. Oral mucosa is moist and without lesions. Tongue is normal in shape, size, and color and without lesions NECK: supple, trachea midline, no neck masses, no thyroid tenderness, no thyromegaly LYMPHATICS: no LAN in the neck, no supraclavicular LAN RESPIRATORY: breathing is even & unlabored, BS CTAB CARDIAC: RRR, no murmur,no extra heart sounds, no edema GI: abdomen soft, normal  BS, no masses, no tenderness, no hepatomegaly, no splenomegaly, +PEG tube intact EXTREMITIES:  Able to move LUE from elbow; not able to move BLE and RUE PSYCHIATRIC:  Affect and behavior are appropriate  LABS/RADIOLOGY:  None available in chart    ASSESSMENT/PLAN:  CVA history - for rehabilitation; continue Eliquis, metoprolol, isosorbide dinitrate and hydralazine  Dysphagia - continue tube feedings and aspiration precaution  DVT RLE - continue Eliquis 5 mg 1 tab twice a day; check CBC  Hyperlipidemia - continue atorvastatin 80 mg 1 tab daily at bedtime; check lipid panel  Hypertension - continue metoprolol tartrate 75 mg every 8 hours, hydralazine 25 mg 3 times a day and isosorbide dinitrate 10 mg 1 tab 3 times a day; check CMP  Systolic CHF - continue hydralazine, isosorbide dinitrate and metoprolol  Seizure disorder - continue Keppra 100 mg/ML give 500 mg/5 ML twice a day  Major depression - mood is stable; continue Prozac 40 mg/10 mL via G-tube twice a day  Diabetes mellitus, type II -  continue Lantus 40 units subcutaneous daily at bedtime; check hgbA1c      Goals of care:  Short-term rehabilitation    [facility], NP [facility] [phone]

## 2015-05-27 DIAGNOSIS — E119 Type 2 diabetes mellitus without complications: Secondary | ICD-10-CM | POA: Diagnosis not present

## 2015-05-27 DIAGNOSIS — E46 Unspecified protein-calorie malnutrition: Secondary | ICD-10-CM | POA: Diagnosis not present

## 2015-05-27 DIAGNOSIS — I69391 Dysphagia following cerebral infarction: Secondary | ICD-10-CM | POA: Diagnosis not present

## 2015-05-27 DIAGNOSIS — I82401 Acute embolism and thrombosis of unspecified deep veins of right lower extremity: Secondary | ICD-10-CM | POA: Diagnosis not present

## 2015-05-27 DIAGNOSIS — I69320 Aphasia following cerebral infarction: Secondary | ICD-10-CM | POA: Diagnosis not present

## 2015-05-27 DIAGNOSIS — I10 Essential (primary) hypertension: Secondary | ICD-10-CM | POA: Diagnosis not present

## 2015-05-27 DIAGNOSIS — I5022 Chronic systolic (congestive) heart failure: Secondary | ICD-10-CM | POA: Diagnosis not present

## 2015-05-27 DIAGNOSIS — Z431 Encounter for attention to gastrostomy: Secondary | ICD-10-CM | POA: Diagnosis not present

## 2015-05-27 DIAGNOSIS — Z794 Long term (current) use of insulin: Secondary | ICD-10-CM | POA: Diagnosis not present

## 2015-05-27 DIAGNOSIS — G40909 Epilepsy, unspecified, not intractable, without status epilepticus: Secondary | ICD-10-CM | POA: Diagnosis not present

## 2015-05-27 DIAGNOSIS — I69398 Other sequelae of cerebral infarction: Secondary | ICD-10-CM | POA: Diagnosis not present

## 2015-05-27 DIAGNOSIS — L89151 Pressure ulcer of sacral region, stage 1: Secondary | ICD-10-CM | POA: Diagnosis not present

## 2015-05-27 DIAGNOSIS — F0631 Mood disorder due to known physiological condition with depressive features: Secondary | ICD-10-CM | POA: Diagnosis not present

## 2015-05-29 DIAGNOSIS — I69320 Aphasia following cerebral infarction: Secondary | ICD-10-CM | POA: Diagnosis not present

## 2015-05-29 DIAGNOSIS — E119 Type 2 diabetes mellitus without complications: Secondary | ICD-10-CM | POA: Diagnosis not present

## 2015-05-29 DIAGNOSIS — I69391 Dysphagia following cerebral infarction: Secondary | ICD-10-CM | POA: Diagnosis not present

## 2015-05-29 DIAGNOSIS — I82401 Acute embolism and thrombosis of unspecified deep veins of right lower extremity: Secondary | ICD-10-CM | POA: Diagnosis not present

## 2015-05-29 DIAGNOSIS — Z431 Encounter for attention to gastrostomy: Secondary | ICD-10-CM | POA: Diagnosis not present

## 2015-05-29 DIAGNOSIS — L89151 Pressure ulcer of sacral region, stage 1: Secondary | ICD-10-CM | POA: Diagnosis not present

## 2015-05-30 DIAGNOSIS — E119 Type 2 diabetes mellitus without complications: Secondary | ICD-10-CM | POA: Diagnosis not present

## 2015-05-30 DIAGNOSIS — Z431 Encounter for attention to gastrostomy: Secondary | ICD-10-CM | POA: Diagnosis not present

## 2015-05-30 DIAGNOSIS — I69391 Dysphagia following cerebral infarction: Secondary | ICD-10-CM | POA: Diagnosis not present

## 2015-05-30 DIAGNOSIS — I82401 Acute embolism and thrombosis of unspecified deep veins of right lower extremity: Secondary | ICD-10-CM | POA: Diagnosis not present

## 2015-05-30 DIAGNOSIS — L89151 Pressure ulcer of sacral region, stage 1: Secondary | ICD-10-CM | POA: Diagnosis not present

## 2015-05-30 DIAGNOSIS — I69320 Aphasia following cerebral infarction: Secondary | ICD-10-CM | POA: Diagnosis not present

## 2015-05-31 DIAGNOSIS — I82401 Acute embolism and thrombosis of unspecified deep veins of right lower extremity: Secondary | ICD-10-CM | POA: Diagnosis not present

## 2015-05-31 DIAGNOSIS — L89151 Pressure ulcer of sacral region, stage 1: Secondary | ICD-10-CM | POA: Diagnosis not present

## 2015-05-31 DIAGNOSIS — I69391 Dysphagia following cerebral infarction: Secondary | ICD-10-CM | POA: Diagnosis not present

## 2015-05-31 DIAGNOSIS — Z431 Encounter for attention to gastrostomy: Secondary | ICD-10-CM | POA: Diagnosis not present

## 2015-05-31 DIAGNOSIS — I69320 Aphasia following cerebral infarction: Secondary | ICD-10-CM | POA: Diagnosis not present

## 2015-05-31 DIAGNOSIS — E119 Type 2 diabetes mellitus without complications: Secondary | ICD-10-CM | POA: Diagnosis not present

## 2015-06-01 DIAGNOSIS — E119 Type 2 diabetes mellitus without complications: Secondary | ICD-10-CM | POA: Diagnosis not present

## 2015-06-01 DIAGNOSIS — I82401 Acute embolism and thrombosis of unspecified deep veins of right lower extremity: Secondary | ICD-10-CM | POA: Diagnosis not present

## 2015-06-01 DIAGNOSIS — I69391 Dysphagia following cerebral infarction: Secondary | ICD-10-CM | POA: Diagnosis not present

## 2015-06-01 DIAGNOSIS — L89151 Pressure ulcer of sacral region, stage 1: Secondary | ICD-10-CM | POA: Diagnosis not present

## 2015-06-01 DIAGNOSIS — I69320 Aphasia following cerebral infarction: Secondary | ICD-10-CM | POA: Diagnosis not present

## 2015-06-01 DIAGNOSIS — Z431 Encounter for attention to gastrostomy: Secondary | ICD-10-CM | POA: Diagnosis not present

## 2015-06-04 DIAGNOSIS — L89151 Pressure ulcer of sacral region, stage 1: Secondary | ICD-10-CM | POA: Diagnosis not present

## 2015-06-04 DIAGNOSIS — I82401 Acute embolism and thrombosis of unspecified deep veins of right lower extremity: Secondary | ICD-10-CM | POA: Diagnosis not present

## 2015-06-04 DIAGNOSIS — E119 Type 2 diabetes mellitus without complications: Secondary | ICD-10-CM | POA: Diagnosis not present

## 2015-06-04 DIAGNOSIS — Z431 Encounter for attention to gastrostomy: Secondary | ICD-10-CM | POA: Diagnosis not present

## 2015-06-04 DIAGNOSIS — I69320 Aphasia following cerebral infarction: Secondary | ICD-10-CM | POA: Diagnosis not present

## 2015-06-04 DIAGNOSIS — I69391 Dysphagia following cerebral infarction: Secondary | ICD-10-CM | POA: Diagnosis not present

## 2015-06-05 DIAGNOSIS — I69320 Aphasia following cerebral infarction: Secondary | ICD-10-CM | POA: Diagnosis not present

## 2015-06-05 DIAGNOSIS — Z431 Encounter for attention to gastrostomy: Secondary | ICD-10-CM | POA: Diagnosis not present

## 2015-06-05 DIAGNOSIS — E119 Type 2 diabetes mellitus without complications: Secondary | ICD-10-CM | POA: Diagnosis not present

## 2015-06-05 DIAGNOSIS — I69391 Dysphagia following cerebral infarction: Secondary | ICD-10-CM | POA: Diagnosis not present

## 2015-06-05 DIAGNOSIS — L89151 Pressure ulcer of sacral region, stage 1: Secondary | ICD-10-CM | POA: Diagnosis not present

## 2015-06-05 DIAGNOSIS — I82401 Acute embolism and thrombosis of unspecified deep veins of right lower extremity: Secondary | ICD-10-CM | POA: Diagnosis not present

## 2015-06-06 DIAGNOSIS — Z431 Encounter for attention to gastrostomy: Secondary | ICD-10-CM | POA: Diagnosis not present

## 2015-06-06 DIAGNOSIS — E119 Type 2 diabetes mellitus without complications: Secondary | ICD-10-CM | POA: Diagnosis not present

## 2015-06-06 DIAGNOSIS — I82401 Acute embolism and thrombosis of unspecified deep veins of right lower extremity: Secondary | ICD-10-CM | POA: Diagnosis not present

## 2015-06-06 DIAGNOSIS — L89151 Pressure ulcer of sacral region, stage 1: Secondary | ICD-10-CM | POA: Diagnosis not present

## 2015-06-06 DIAGNOSIS — I69320 Aphasia following cerebral infarction: Secondary | ICD-10-CM | POA: Diagnosis not present

## 2015-06-06 DIAGNOSIS — I69391 Dysphagia following cerebral infarction: Secondary | ICD-10-CM | POA: Diagnosis not present

## 2015-06-07 DIAGNOSIS — I69391 Dysphagia following cerebral infarction: Secondary | ICD-10-CM | POA: Diagnosis not present

## 2015-06-07 DIAGNOSIS — Z431 Encounter for attention to gastrostomy: Secondary | ICD-10-CM | POA: Diagnosis not present

## 2015-06-07 DIAGNOSIS — I82401 Acute embolism and thrombosis of unspecified deep veins of right lower extremity: Secondary | ICD-10-CM | POA: Diagnosis not present

## 2015-06-07 DIAGNOSIS — I69320 Aphasia following cerebral infarction: Secondary | ICD-10-CM | POA: Diagnosis not present

## 2015-06-07 DIAGNOSIS — E119 Type 2 diabetes mellitus without complications: Secondary | ICD-10-CM | POA: Diagnosis not present

## 2015-06-07 DIAGNOSIS — L89151 Pressure ulcer of sacral region, stage 1: Secondary | ICD-10-CM | POA: Diagnosis not present

## 2015-06-08 DIAGNOSIS — I69320 Aphasia following cerebral infarction: Secondary | ICD-10-CM | POA: Diagnosis not present

## 2015-06-08 DIAGNOSIS — I69391 Dysphagia following cerebral infarction: Secondary | ICD-10-CM | POA: Diagnosis not present

## 2015-06-08 DIAGNOSIS — L89151 Pressure ulcer of sacral region, stage 1: Secondary | ICD-10-CM | POA: Diagnosis not present

## 2015-06-08 DIAGNOSIS — I82401 Acute embolism and thrombosis of unspecified deep veins of right lower extremity: Secondary | ICD-10-CM | POA: Diagnosis not present

## 2015-06-08 DIAGNOSIS — Z431 Encounter for attention to gastrostomy: Secondary | ICD-10-CM | POA: Diagnosis not present

## 2015-06-08 DIAGNOSIS — E119 Type 2 diabetes mellitus without complications: Secondary | ICD-10-CM | POA: Diagnosis not present

## 2015-06-11 DIAGNOSIS — I6990 Unspecified sequelae of unspecified cerebrovascular disease: Secondary | ICD-10-CM | POA: Diagnosis not present

## 2015-06-11 DIAGNOSIS — E1165 Type 2 diabetes mellitus with hyperglycemia: Secondary | ICD-10-CM | POA: Diagnosis not present

## 2015-06-11 DIAGNOSIS — Z931 Gastrostomy status: Secondary | ICD-10-CM | POA: Diagnosis not present

## 2015-06-11 DIAGNOSIS — Z79899 Other long term (current) drug therapy: Secondary | ICD-10-CM | POA: Diagnosis not present

## 2015-06-12 DIAGNOSIS — Z431 Encounter for attention to gastrostomy: Secondary | ICD-10-CM | POA: Diagnosis not present

## 2015-06-12 DIAGNOSIS — I69391 Dysphagia following cerebral infarction: Secondary | ICD-10-CM | POA: Diagnosis not present

## 2015-06-12 DIAGNOSIS — E119 Type 2 diabetes mellitus without complications: Secondary | ICD-10-CM | POA: Diagnosis not present

## 2015-06-12 DIAGNOSIS — I69320 Aphasia following cerebral infarction: Secondary | ICD-10-CM | POA: Diagnosis not present

## 2015-06-12 DIAGNOSIS — I82401 Acute embolism and thrombosis of unspecified deep veins of right lower extremity: Secondary | ICD-10-CM | POA: Diagnosis not present

## 2015-06-12 DIAGNOSIS — L89151 Pressure ulcer of sacral region, stage 1: Secondary | ICD-10-CM | POA: Diagnosis not present

## 2015-06-13 DIAGNOSIS — I82401 Acute embolism and thrombosis of unspecified deep veins of right lower extremity: Secondary | ICD-10-CM | POA: Diagnosis not present

## 2015-06-13 DIAGNOSIS — L89151 Pressure ulcer of sacral region, stage 1: Secondary | ICD-10-CM | POA: Diagnosis not present

## 2015-06-13 DIAGNOSIS — Z431 Encounter for attention to gastrostomy: Secondary | ICD-10-CM | POA: Diagnosis not present

## 2015-06-13 DIAGNOSIS — E119 Type 2 diabetes mellitus without complications: Secondary | ICD-10-CM | POA: Diagnosis not present

## 2015-06-13 DIAGNOSIS — I69320 Aphasia following cerebral infarction: Secondary | ICD-10-CM | POA: Diagnosis not present

## 2015-06-13 DIAGNOSIS — I69391 Dysphagia following cerebral infarction: Secondary | ICD-10-CM | POA: Diagnosis not present

## 2015-06-14 DIAGNOSIS — I69320 Aphasia following cerebral infarction: Secondary | ICD-10-CM | POA: Diagnosis not present

## 2015-06-14 DIAGNOSIS — I82401 Acute embolism and thrombosis of unspecified deep veins of right lower extremity: Secondary | ICD-10-CM | POA: Diagnosis not present

## 2015-06-14 DIAGNOSIS — Z431 Encounter for attention to gastrostomy: Secondary | ICD-10-CM | POA: Diagnosis not present

## 2015-06-14 DIAGNOSIS — I69391 Dysphagia following cerebral infarction: Secondary | ICD-10-CM | POA: Diagnosis not present

## 2015-06-14 DIAGNOSIS — L89151 Pressure ulcer of sacral region, stage 1: Secondary | ICD-10-CM | POA: Diagnosis not present

## 2015-06-14 DIAGNOSIS — E119 Type 2 diabetes mellitus without complications: Secondary | ICD-10-CM | POA: Diagnosis not present

## 2015-06-15 DIAGNOSIS — E119 Type 2 diabetes mellitus without complications: Secondary | ICD-10-CM | POA: Diagnosis not present

## 2015-06-15 DIAGNOSIS — L89151 Pressure ulcer of sacral region, stage 1: Secondary | ICD-10-CM | POA: Diagnosis not present

## 2015-06-15 DIAGNOSIS — Z431 Encounter for attention to gastrostomy: Secondary | ICD-10-CM | POA: Diagnosis not present

## 2015-06-15 DIAGNOSIS — I82401 Acute embolism and thrombosis of unspecified deep veins of right lower extremity: Secondary | ICD-10-CM | POA: Diagnosis not present

## 2015-06-15 DIAGNOSIS — I69391 Dysphagia following cerebral infarction: Secondary | ICD-10-CM | POA: Diagnosis not present

## 2015-06-15 DIAGNOSIS — I69320 Aphasia following cerebral infarction: Secondary | ICD-10-CM | POA: Diagnosis not present

## 2015-06-18 DIAGNOSIS — L89151 Pressure ulcer of sacral region, stage 1: Secondary | ICD-10-CM | POA: Diagnosis not present

## 2015-06-18 DIAGNOSIS — I69391 Dysphagia following cerebral infarction: Secondary | ICD-10-CM | POA: Diagnosis not present

## 2015-06-18 DIAGNOSIS — Z431 Encounter for attention to gastrostomy: Secondary | ICD-10-CM | POA: Diagnosis not present

## 2015-06-18 DIAGNOSIS — E119 Type 2 diabetes mellitus without complications: Secondary | ICD-10-CM | POA: Diagnosis not present

## 2015-06-18 DIAGNOSIS — I82401 Acute embolism and thrombosis of unspecified deep veins of right lower extremity: Secondary | ICD-10-CM | POA: Diagnosis not present

## 2015-06-18 DIAGNOSIS — I69320 Aphasia following cerebral infarction: Secondary | ICD-10-CM | POA: Diagnosis not present

## 2015-06-19 DIAGNOSIS — I69320 Aphasia following cerebral infarction: Secondary | ICD-10-CM | POA: Diagnosis not present

## 2015-06-19 DIAGNOSIS — I82401 Acute embolism and thrombosis of unspecified deep veins of right lower extremity: Secondary | ICD-10-CM | POA: Diagnosis not present

## 2015-06-19 DIAGNOSIS — E119 Type 2 diabetes mellitus without complications: Secondary | ICD-10-CM | POA: Diagnosis not present

## 2015-06-19 DIAGNOSIS — I69391 Dysphagia following cerebral infarction: Secondary | ICD-10-CM | POA: Diagnosis not present

## 2015-06-19 DIAGNOSIS — Z431 Encounter for attention to gastrostomy: Secondary | ICD-10-CM | POA: Diagnosis not present

## 2015-06-19 DIAGNOSIS — L89151 Pressure ulcer of sacral region, stage 1: Secondary | ICD-10-CM | POA: Diagnosis not present

## 2015-06-20 DIAGNOSIS — L89151 Pressure ulcer of sacral region, stage 1: Secondary | ICD-10-CM | POA: Diagnosis not present

## 2015-06-20 DIAGNOSIS — Z431 Encounter for attention to gastrostomy: Secondary | ICD-10-CM | POA: Diagnosis not present

## 2015-06-20 DIAGNOSIS — I69391 Dysphagia following cerebral infarction: Secondary | ICD-10-CM | POA: Diagnosis not present

## 2015-06-20 DIAGNOSIS — E119 Type 2 diabetes mellitus without complications: Secondary | ICD-10-CM | POA: Diagnosis not present

## 2015-06-20 DIAGNOSIS — I82401 Acute embolism and thrombosis of unspecified deep veins of right lower extremity: Secondary | ICD-10-CM | POA: Diagnosis not present

## 2015-06-20 DIAGNOSIS — I69320 Aphasia following cerebral infarction: Secondary | ICD-10-CM | POA: Diagnosis not present

## 2015-06-21 DIAGNOSIS — I69320 Aphasia following cerebral infarction: Secondary | ICD-10-CM | POA: Diagnosis not present

## 2015-06-21 DIAGNOSIS — I82401 Acute embolism and thrombosis of unspecified deep veins of right lower extremity: Secondary | ICD-10-CM | POA: Diagnosis not present

## 2015-06-21 DIAGNOSIS — I69391 Dysphagia following cerebral infarction: Secondary | ICD-10-CM | POA: Diagnosis not present

## 2015-06-21 DIAGNOSIS — E119 Type 2 diabetes mellitus without complications: Secondary | ICD-10-CM | POA: Diagnosis not present

## 2015-06-21 DIAGNOSIS — Z431 Encounter for attention to gastrostomy: Secondary | ICD-10-CM | POA: Diagnosis not present

## 2015-06-21 DIAGNOSIS — L89151 Pressure ulcer of sacral region, stage 1: Secondary | ICD-10-CM | POA: Diagnosis not present

## 2015-06-22 DIAGNOSIS — I69391 Dysphagia following cerebral infarction: Secondary | ICD-10-CM | POA: Diagnosis not present

## 2015-06-22 DIAGNOSIS — E119 Type 2 diabetes mellitus without complications: Secondary | ICD-10-CM | POA: Diagnosis not present

## 2015-06-22 DIAGNOSIS — Z431 Encounter for attention to gastrostomy: Secondary | ICD-10-CM | POA: Diagnosis not present

## 2015-06-22 DIAGNOSIS — L89151 Pressure ulcer of sacral region, stage 1: Secondary | ICD-10-CM | POA: Diagnosis not present

## 2015-06-22 DIAGNOSIS — I82401 Acute embolism and thrombosis of unspecified deep veins of right lower extremity: Secondary | ICD-10-CM | POA: Diagnosis not present

## 2015-06-22 DIAGNOSIS — I69320 Aphasia following cerebral infarction: Secondary | ICD-10-CM | POA: Diagnosis not present

## 2015-06-23 DIAGNOSIS — R079 Chest pain, unspecified: Secondary | ICD-10-CM | POA: Diagnosis not present

## 2015-06-23 DIAGNOSIS — R0602 Shortness of breath: Secondary | ICD-10-CM | POA: Diagnosis not present

## 2015-06-25 DIAGNOSIS — I6789 Other cerebrovascular disease: Secondary | ICD-10-CM | POA: Diagnosis not present

## 2015-06-25 DIAGNOSIS — Z8249 Family history of ischemic heart disease and other diseases of the circulatory system: Secondary | ICD-10-CM | POA: Diagnosis not present

## 2015-06-25 DIAGNOSIS — E785 Hyperlipidemia, unspecified: Secondary | ICD-10-CM | POA: Diagnosis present

## 2015-06-25 DIAGNOSIS — E108 Type 1 diabetes mellitus with unspecified complications: Secondary | ICD-10-CM | POA: Diagnosis not present

## 2015-06-25 DIAGNOSIS — I69351 Hemiplegia and hemiparesis following cerebral infarction affecting right dominant side: Secondary | ICD-10-CM | POA: Diagnosis not present

## 2015-06-25 DIAGNOSIS — Z7901 Long term (current) use of anticoagulants: Secondary | ICD-10-CM | POA: Diagnosis not present

## 2015-06-25 DIAGNOSIS — Z6822 Body mass index (BMI) 22.0-22.9, adult: Secondary | ICD-10-CM | POA: Diagnosis not present

## 2015-06-25 DIAGNOSIS — I252 Old myocardial infarction: Secondary | ICD-10-CM | POA: Diagnosis not present

## 2015-06-25 DIAGNOSIS — R413 Other amnesia: Secondary | ICD-10-CM | POA: Diagnosis present

## 2015-06-25 DIAGNOSIS — I2510 Atherosclerotic heart disease of native coronary artery without angina pectoris: Secondary | ICD-10-CM | POA: Diagnosis present

## 2015-06-25 DIAGNOSIS — F329 Major depressive disorder, single episode, unspecified: Secondary | ICD-10-CM | POA: Diagnosis present

## 2015-06-25 DIAGNOSIS — B961 Klebsiella pneumoniae [K. pneumoniae] as the cause of diseases classified elsewhere: Secondary | ICD-10-CM | POA: Diagnosis present

## 2015-06-25 DIAGNOSIS — I63412 Cerebral infarction due to embolism of left middle cerebral artery: Secondary | ICD-10-CM | POA: Diagnosis not present

## 2015-06-25 DIAGNOSIS — Z9119 Patient's noncompliance with other medical treatment and regimen: Secondary | ICD-10-CM | POA: Diagnosis not present

## 2015-06-25 DIAGNOSIS — E1065 Type 1 diabetes mellitus with hyperglycemia: Secondary | ICD-10-CM | POA: Diagnosis present

## 2015-06-25 DIAGNOSIS — Z794 Long term (current) use of insulin: Secondary | ICD-10-CM | POA: Diagnosis not present

## 2015-06-25 DIAGNOSIS — I10 Essential (primary) hypertension: Secondary | ICD-10-CM | POA: Diagnosis not present

## 2015-06-25 DIAGNOSIS — I110 Hypertensive heart disease with heart failure: Secondary | ICD-10-CM | POA: Diagnosis present

## 2015-06-25 DIAGNOSIS — N390 Urinary tract infection, site not specified: Secondary | ICD-10-CM | POA: Diagnosis present

## 2015-06-25 DIAGNOSIS — Z87891 Personal history of nicotine dependence: Secondary | ICD-10-CM | POA: Diagnosis not present

## 2015-06-25 DIAGNOSIS — I5022 Chronic systolic (congestive) heart failure: Secondary | ICD-10-CM | POA: Diagnosis present

## 2015-06-25 DIAGNOSIS — R072 Precordial pain: Secondary | ICD-10-CM | POA: Diagnosis not present

## 2015-06-25 DIAGNOSIS — R0789 Other chest pain: Secondary | ICD-10-CM | POA: Diagnosis present

## 2015-06-25 DIAGNOSIS — E43 Unspecified severe protein-calorie malnutrition: Secondary | ICD-10-CM | POA: Diagnosis present

## 2015-06-27 DIAGNOSIS — Z431 Encounter for attention to gastrostomy: Secondary | ICD-10-CM | POA: Diagnosis not present

## 2015-06-27 DIAGNOSIS — I69320 Aphasia following cerebral infarction: Secondary | ICD-10-CM | POA: Diagnosis not present

## 2015-06-27 DIAGNOSIS — I82401 Acute embolism and thrombosis of unspecified deep veins of right lower extremity: Secondary | ICD-10-CM | POA: Diagnosis not present

## 2015-06-27 DIAGNOSIS — E119 Type 2 diabetes mellitus without complications: Secondary | ICD-10-CM | POA: Diagnosis not present

## 2015-06-27 DIAGNOSIS — L89151 Pressure ulcer of sacral region, stage 1: Secondary | ICD-10-CM | POA: Diagnosis not present

## 2015-06-27 DIAGNOSIS — I69391 Dysphagia following cerebral infarction: Secondary | ICD-10-CM | POA: Diagnosis not present

## 2015-06-28 ENCOUNTER — Other Ambulatory Visit: Payer: Self-pay | Admitting: Internal Medicine

## 2015-06-28 DIAGNOSIS — I69320 Aphasia following cerebral infarction: Secondary | ICD-10-CM | POA: Diagnosis not present

## 2015-06-28 DIAGNOSIS — E119 Type 2 diabetes mellitus without complications: Secondary | ICD-10-CM | POA: Diagnosis not present

## 2015-06-28 DIAGNOSIS — I82401 Acute embolism and thrombosis of unspecified deep veins of right lower extremity: Secondary | ICD-10-CM | POA: Diagnosis not present

## 2015-06-28 DIAGNOSIS — L89151 Pressure ulcer of sacral region, stage 1: Secondary | ICD-10-CM | POA: Diagnosis not present

## 2015-06-28 DIAGNOSIS — I69391 Dysphagia following cerebral infarction: Secondary | ICD-10-CM | POA: Diagnosis not present

## 2015-06-28 DIAGNOSIS — Z431 Encounter for attention to gastrostomy: Secondary | ICD-10-CM | POA: Diagnosis not present

## 2015-06-29 DIAGNOSIS — I69391 Dysphagia following cerebral infarction: Secondary | ICD-10-CM | POA: Diagnosis not present

## 2015-06-29 DIAGNOSIS — Z431 Encounter for attention to gastrostomy: Secondary | ICD-10-CM | POA: Diagnosis not present

## 2015-06-29 DIAGNOSIS — E119 Type 2 diabetes mellitus without complications: Secondary | ICD-10-CM | POA: Diagnosis not present

## 2015-06-29 DIAGNOSIS — I82401 Acute embolism and thrombosis of unspecified deep veins of right lower extremity: Secondary | ICD-10-CM | POA: Diagnosis not present

## 2015-06-29 DIAGNOSIS — I69320 Aphasia following cerebral infarction: Secondary | ICD-10-CM | POA: Diagnosis not present

## 2015-06-29 DIAGNOSIS — L89151 Pressure ulcer of sacral region, stage 1: Secondary | ICD-10-CM | POA: Diagnosis not present

## 2015-07-02 DIAGNOSIS — L89151 Pressure ulcer of sacral region, stage 1: Secondary | ICD-10-CM | POA: Diagnosis not present

## 2015-07-02 DIAGNOSIS — E119 Type 2 diabetes mellitus without complications: Secondary | ICD-10-CM | POA: Diagnosis not present

## 2015-07-02 DIAGNOSIS — I69320 Aphasia following cerebral infarction: Secondary | ICD-10-CM | POA: Diagnosis not present

## 2015-07-02 DIAGNOSIS — I82401 Acute embolism and thrombosis of unspecified deep veins of right lower extremity: Secondary | ICD-10-CM | POA: Diagnosis not present

## 2015-07-02 DIAGNOSIS — I69391 Dysphagia following cerebral infarction: Secondary | ICD-10-CM | POA: Diagnosis not present

## 2015-07-02 DIAGNOSIS — Z431 Encounter for attention to gastrostomy: Secondary | ICD-10-CM | POA: Diagnosis not present

## 2015-07-03 DIAGNOSIS — Z431 Encounter for attention to gastrostomy: Secondary | ICD-10-CM | POA: Diagnosis not present

## 2015-07-03 DIAGNOSIS — I69391 Dysphagia following cerebral infarction: Secondary | ICD-10-CM | POA: Diagnosis not present

## 2015-07-03 DIAGNOSIS — E119 Type 2 diabetes mellitus without complications: Secondary | ICD-10-CM | POA: Diagnosis not present

## 2015-07-03 DIAGNOSIS — L89151 Pressure ulcer of sacral region, stage 1: Secondary | ICD-10-CM | POA: Diagnosis not present

## 2015-07-03 DIAGNOSIS — I82401 Acute embolism and thrombosis of unspecified deep veins of right lower extremity: Secondary | ICD-10-CM | POA: Diagnosis not present

## 2015-07-03 DIAGNOSIS — I69320 Aphasia following cerebral infarction: Secondary | ICD-10-CM | POA: Diagnosis not present

## 2015-07-04 DIAGNOSIS — Z431 Encounter for attention to gastrostomy: Secondary | ICD-10-CM | POA: Diagnosis not present

## 2015-07-04 DIAGNOSIS — E119 Type 2 diabetes mellitus without complications: Secondary | ICD-10-CM | POA: Diagnosis not present

## 2015-07-04 DIAGNOSIS — I69391 Dysphagia following cerebral infarction: Secondary | ICD-10-CM | POA: Diagnosis not present

## 2015-07-04 DIAGNOSIS — L89151 Pressure ulcer of sacral region, stage 1: Secondary | ICD-10-CM | POA: Diagnosis not present

## 2015-07-04 DIAGNOSIS — I69320 Aphasia following cerebral infarction: Secondary | ICD-10-CM | POA: Diagnosis not present

## 2015-07-04 DIAGNOSIS — I82401 Acute embolism and thrombosis of unspecified deep veins of right lower extremity: Secondary | ICD-10-CM | POA: Diagnosis not present

## 2015-07-09 DIAGNOSIS — I6930 Unspecified sequelae of cerebral infarction: Secondary | ICD-10-CM | POA: Diagnosis not present

## 2015-07-09 DIAGNOSIS — E1165 Type 2 diabetes mellitus with hyperglycemia: Secondary | ICD-10-CM | POA: Diagnosis not present

## 2015-07-09 DIAGNOSIS — E7800 Pure hypercholesterolemia, unspecified: Secondary | ICD-10-CM | POA: Diagnosis not present

## 2015-07-09 DIAGNOSIS — Z931 Gastrostomy status: Secondary | ICD-10-CM | POA: Diagnosis not present

## 2015-07-10 DIAGNOSIS — I69391 Dysphagia following cerebral infarction: Secondary | ICD-10-CM | POA: Diagnosis not present

## 2015-07-10 DIAGNOSIS — I69320 Aphasia following cerebral infarction: Secondary | ICD-10-CM | POA: Diagnosis not present

## 2015-07-10 DIAGNOSIS — E119 Type 2 diabetes mellitus without complications: Secondary | ICD-10-CM | POA: Diagnosis not present

## 2015-07-10 DIAGNOSIS — I82401 Acute embolism and thrombosis of unspecified deep veins of right lower extremity: Secondary | ICD-10-CM | POA: Diagnosis not present

## 2015-07-10 DIAGNOSIS — Z431 Encounter for attention to gastrostomy: Secondary | ICD-10-CM | POA: Diagnosis not present

## 2015-07-10 DIAGNOSIS — L89151 Pressure ulcer of sacral region, stage 1: Secondary | ICD-10-CM | POA: Diagnosis not present

## 2015-07-11 DIAGNOSIS — I69391 Dysphagia following cerebral infarction: Secondary | ICD-10-CM | POA: Diagnosis not present

## 2015-07-11 DIAGNOSIS — I69320 Aphasia following cerebral infarction: Secondary | ICD-10-CM | POA: Diagnosis not present

## 2015-07-11 DIAGNOSIS — Z431 Encounter for attention to gastrostomy: Secondary | ICD-10-CM | POA: Diagnosis not present

## 2015-07-11 DIAGNOSIS — L89151 Pressure ulcer of sacral region, stage 1: Secondary | ICD-10-CM | POA: Diagnosis not present

## 2015-07-11 DIAGNOSIS — E119 Type 2 diabetes mellitus without complications: Secondary | ICD-10-CM | POA: Diagnosis not present

## 2015-07-11 DIAGNOSIS — I82401 Acute embolism and thrombosis of unspecified deep veins of right lower extremity: Secondary | ICD-10-CM | POA: Diagnosis not present

## 2015-07-12 DIAGNOSIS — L89151 Pressure ulcer of sacral region, stage 1: Secondary | ICD-10-CM | POA: Diagnosis not present

## 2015-07-12 DIAGNOSIS — E119 Type 2 diabetes mellitus without complications: Secondary | ICD-10-CM | POA: Diagnosis not present

## 2015-07-12 DIAGNOSIS — Z431 Encounter for attention to gastrostomy: Secondary | ICD-10-CM | POA: Diagnosis not present

## 2015-07-12 DIAGNOSIS — I82401 Acute embolism and thrombosis of unspecified deep veins of right lower extremity: Secondary | ICD-10-CM | POA: Diagnosis not present

## 2015-07-12 DIAGNOSIS — I69320 Aphasia following cerebral infarction: Secondary | ICD-10-CM | POA: Diagnosis not present

## 2015-07-12 DIAGNOSIS — I69391 Dysphagia following cerebral infarction: Secondary | ICD-10-CM | POA: Diagnosis not present

## 2015-07-14 DIAGNOSIS — Z431 Encounter for attention to gastrostomy: Secondary | ICD-10-CM | POA: Diagnosis not present

## 2015-07-14 DIAGNOSIS — L89151 Pressure ulcer of sacral region, stage 1: Secondary | ICD-10-CM | POA: Diagnosis not present

## 2015-07-14 DIAGNOSIS — E119 Type 2 diabetes mellitus without complications: Secondary | ICD-10-CM | POA: Diagnosis not present

## 2015-07-14 DIAGNOSIS — I69320 Aphasia following cerebral infarction: Secondary | ICD-10-CM | POA: Diagnosis not present

## 2015-07-14 DIAGNOSIS — I69391 Dysphagia following cerebral infarction: Secondary | ICD-10-CM | POA: Diagnosis not present

## 2015-07-14 DIAGNOSIS — I82401 Acute embolism and thrombosis of unspecified deep veins of right lower extremity: Secondary | ICD-10-CM | POA: Diagnosis not present

## 2015-07-16 ENCOUNTER — Other Ambulatory Visit: Payer: Self-pay | Admitting: Internal Medicine

## 2015-07-16 DIAGNOSIS — I69320 Aphasia following cerebral infarction: Secondary | ICD-10-CM | POA: Diagnosis not present

## 2015-07-16 DIAGNOSIS — I82401 Acute embolism and thrombosis of unspecified deep veins of right lower extremity: Secondary | ICD-10-CM | POA: Diagnosis not present

## 2015-07-16 DIAGNOSIS — Z431 Encounter for attention to gastrostomy: Secondary | ICD-10-CM | POA: Diagnosis not present

## 2015-07-16 DIAGNOSIS — L89151 Pressure ulcer of sacral region, stage 1: Secondary | ICD-10-CM | POA: Diagnosis not present

## 2015-07-16 DIAGNOSIS — I69391 Dysphagia following cerebral infarction: Secondary | ICD-10-CM | POA: Diagnosis not present

## 2015-07-16 DIAGNOSIS — E119 Type 2 diabetes mellitus without complications: Secondary | ICD-10-CM | POA: Diagnosis not present

## 2015-07-17 DIAGNOSIS — I69391 Dysphagia following cerebral infarction: Secondary | ICD-10-CM | POA: Diagnosis not present

## 2015-07-17 DIAGNOSIS — Z431 Encounter for attention to gastrostomy: Secondary | ICD-10-CM | POA: Diagnosis not present

## 2015-07-17 DIAGNOSIS — E119 Type 2 diabetes mellitus without complications: Secondary | ICD-10-CM | POA: Diagnosis not present

## 2015-07-17 DIAGNOSIS — L89151 Pressure ulcer of sacral region, stage 1: Secondary | ICD-10-CM | POA: Diagnosis not present

## 2015-07-17 DIAGNOSIS — I82401 Acute embolism and thrombosis of unspecified deep veins of right lower extremity: Secondary | ICD-10-CM | POA: Diagnosis not present

## 2015-07-17 DIAGNOSIS — I69320 Aphasia following cerebral infarction: Secondary | ICD-10-CM | POA: Diagnosis not present

## 2015-07-18 ENCOUNTER — Other Ambulatory Visit: Payer: Self-pay | Admitting: Internal Medicine

## 2015-07-18 DIAGNOSIS — I82401 Acute embolism and thrombosis of unspecified deep veins of right lower extremity: Secondary | ICD-10-CM | POA: Diagnosis not present

## 2015-07-18 DIAGNOSIS — Z431 Encounter for attention to gastrostomy: Secondary | ICD-10-CM | POA: Diagnosis not present

## 2015-07-18 DIAGNOSIS — L89151 Pressure ulcer of sacral region, stage 1: Secondary | ICD-10-CM | POA: Diagnosis not present

## 2015-07-18 DIAGNOSIS — I69320 Aphasia following cerebral infarction: Secondary | ICD-10-CM | POA: Diagnosis not present

## 2015-07-18 DIAGNOSIS — E119 Type 2 diabetes mellitus without complications: Secondary | ICD-10-CM | POA: Diagnosis not present

## 2015-07-18 DIAGNOSIS — I69391 Dysphagia following cerebral infarction: Secondary | ICD-10-CM | POA: Diagnosis not present

## 2015-07-19 DIAGNOSIS — I69391 Dysphagia following cerebral infarction: Secondary | ICD-10-CM | POA: Diagnosis not present

## 2015-07-19 DIAGNOSIS — Z431 Encounter for attention to gastrostomy: Secondary | ICD-10-CM | POA: Diagnosis not present

## 2015-07-19 DIAGNOSIS — I69320 Aphasia following cerebral infarction: Secondary | ICD-10-CM | POA: Diagnosis not present

## 2015-07-19 DIAGNOSIS — L89151 Pressure ulcer of sacral region, stage 1: Secondary | ICD-10-CM | POA: Diagnosis not present

## 2015-07-19 DIAGNOSIS — E119 Type 2 diabetes mellitus without complications: Secondary | ICD-10-CM | POA: Diagnosis not present

## 2015-07-19 DIAGNOSIS — I82401 Acute embolism and thrombosis of unspecified deep veins of right lower extremity: Secondary | ICD-10-CM | POA: Diagnosis not present

## 2015-07-20 DIAGNOSIS — E119 Type 2 diabetes mellitus without complications: Secondary | ICD-10-CM | POA: Diagnosis not present

## 2015-07-20 DIAGNOSIS — I82401 Acute embolism and thrombosis of unspecified deep veins of right lower extremity: Secondary | ICD-10-CM | POA: Diagnosis not present

## 2015-07-20 DIAGNOSIS — Z431 Encounter for attention to gastrostomy: Secondary | ICD-10-CM | POA: Diagnosis not present

## 2015-07-20 DIAGNOSIS — I69320 Aphasia following cerebral infarction: Secondary | ICD-10-CM | POA: Diagnosis not present

## 2015-07-20 DIAGNOSIS — L89151 Pressure ulcer of sacral region, stage 1: Secondary | ICD-10-CM | POA: Diagnosis not present

## 2015-07-20 DIAGNOSIS — I69391 Dysphagia following cerebral infarction: Secondary | ICD-10-CM | POA: Diagnosis not present

## 2015-07-24 DIAGNOSIS — I69391 Dysphagia following cerebral infarction: Secondary | ICD-10-CM | POA: Diagnosis not present

## 2015-07-24 DIAGNOSIS — L89151 Pressure ulcer of sacral region, stage 1: Secondary | ICD-10-CM | POA: Diagnosis not present

## 2015-07-24 DIAGNOSIS — E119 Type 2 diabetes mellitus without complications: Secondary | ICD-10-CM | POA: Diagnosis not present

## 2015-07-24 DIAGNOSIS — I82401 Acute embolism and thrombosis of unspecified deep veins of right lower extremity: Secondary | ICD-10-CM | POA: Diagnosis not present

## 2015-07-24 DIAGNOSIS — Z431 Encounter for attention to gastrostomy: Secondary | ICD-10-CM | POA: Diagnosis not present

## 2015-07-24 DIAGNOSIS — I69320 Aphasia following cerebral infarction: Secondary | ICD-10-CM | POA: Diagnosis not present

## 2015-07-25 DIAGNOSIS — I69391 Dysphagia following cerebral infarction: Secondary | ICD-10-CM | POA: Diagnosis not present

## 2015-07-25 DIAGNOSIS — L89151 Pressure ulcer of sacral region, stage 1: Secondary | ICD-10-CM | POA: Diagnosis not present

## 2015-07-25 DIAGNOSIS — E119 Type 2 diabetes mellitus without complications: Secondary | ICD-10-CM | POA: Diagnosis not present

## 2015-07-25 DIAGNOSIS — I82401 Acute embolism and thrombosis of unspecified deep veins of right lower extremity: Secondary | ICD-10-CM | POA: Diagnosis not present

## 2015-07-25 DIAGNOSIS — I69320 Aphasia following cerebral infarction: Secondary | ICD-10-CM | POA: Diagnosis not present

## 2015-07-25 DIAGNOSIS — Z431 Encounter for attention to gastrostomy: Secondary | ICD-10-CM | POA: Diagnosis not present

## 2015-07-26 DIAGNOSIS — N390 Urinary tract infection, site not specified: Secondary | ICD-10-CM | POA: Diagnosis not present

## 2015-07-26 DIAGNOSIS — E119 Type 2 diabetes mellitus without complications: Secondary | ICD-10-CM | POA: Diagnosis not present

## 2015-07-26 DIAGNOSIS — I69391 Dysphagia following cerebral infarction: Secondary | ICD-10-CM | POA: Diagnosis not present

## 2015-07-26 DIAGNOSIS — I69398 Other sequelae of cerebral infarction: Secondary | ICD-10-CM | POA: Diagnosis not present

## 2015-07-26 DIAGNOSIS — I82401 Acute embolism and thrombosis of unspecified deep veins of right lower extremity: Secondary | ICD-10-CM | POA: Diagnosis not present

## 2015-07-26 DIAGNOSIS — Z431 Encounter for attention to gastrostomy: Secondary | ICD-10-CM | POA: Diagnosis not present

## 2015-07-26 DIAGNOSIS — F0631 Mood disorder due to known physiological condition with depressive features: Secondary | ICD-10-CM | POA: Diagnosis not present

## 2015-07-26 DIAGNOSIS — L89151 Pressure ulcer of sacral region, stage 1: Secondary | ICD-10-CM | POA: Diagnosis not present

## 2015-07-26 DIAGNOSIS — Z794 Long term (current) use of insulin: Secondary | ICD-10-CM | POA: Diagnosis not present

## 2015-07-26 DIAGNOSIS — G40909 Epilepsy, unspecified, not intractable, without status epilepticus: Secondary | ICD-10-CM | POA: Diagnosis not present

## 2015-07-26 DIAGNOSIS — E46 Unspecified protein-calorie malnutrition: Secondary | ICD-10-CM | POA: Diagnosis not present

## 2015-07-26 DIAGNOSIS — I5022 Chronic systolic (congestive) heart failure: Secondary | ICD-10-CM | POA: Diagnosis not present

## 2015-07-26 DIAGNOSIS — I69320 Aphasia following cerebral infarction: Secondary | ICD-10-CM | POA: Diagnosis not present

## 2015-07-26 DIAGNOSIS — I10 Essential (primary) hypertension: Secondary | ICD-10-CM | POA: Diagnosis not present

## 2015-07-26 DIAGNOSIS — B961 Klebsiella pneumoniae [K. pneumoniae] as the cause of diseases classified elsewhere: Secondary | ICD-10-CM | POA: Diagnosis not present

## 2015-07-27 DIAGNOSIS — I69320 Aphasia following cerebral infarction: Secondary | ICD-10-CM | POA: Diagnosis not present

## 2015-07-27 DIAGNOSIS — E119 Type 2 diabetes mellitus without complications: Secondary | ICD-10-CM | POA: Diagnosis not present

## 2015-07-27 DIAGNOSIS — L89151 Pressure ulcer of sacral region, stage 1: Secondary | ICD-10-CM | POA: Diagnosis not present

## 2015-07-27 DIAGNOSIS — N390 Urinary tract infection, site not specified: Secondary | ICD-10-CM | POA: Diagnosis not present

## 2015-07-27 DIAGNOSIS — B961 Klebsiella pneumoniae [K. pneumoniae] as the cause of diseases classified elsewhere: Secondary | ICD-10-CM | POA: Diagnosis not present

## 2015-07-27 DIAGNOSIS — I69391 Dysphagia following cerebral infarction: Secondary | ICD-10-CM | POA: Diagnosis not present

## 2015-07-31 DIAGNOSIS — I10 Essential (primary) hypertension: Secondary | ICD-10-CM | POA: Diagnosis not present

## 2015-07-31 DIAGNOSIS — I83019 Varicose veins of right lower extremity with ulcer of unspecified site: Secondary | ICD-10-CM | POA: Diagnosis not present

## 2015-07-31 DIAGNOSIS — Z79899 Other long term (current) drug therapy: Secondary | ICD-10-CM | POA: Diagnosis not present

## 2015-07-31 DIAGNOSIS — E7800 Pure hypercholesterolemia, unspecified: Secondary | ICD-10-CM | POA: Diagnosis not present

## 2015-08-01 DIAGNOSIS — N390 Urinary tract infection, site not specified: Secondary | ICD-10-CM | POA: Diagnosis not present

## 2015-08-01 DIAGNOSIS — E119 Type 2 diabetes mellitus without complications: Secondary | ICD-10-CM | POA: Diagnosis not present

## 2015-08-01 DIAGNOSIS — L89151 Pressure ulcer of sacral region, stage 1: Secondary | ICD-10-CM | POA: Diagnosis not present

## 2015-08-01 DIAGNOSIS — B961 Klebsiella pneumoniae [K. pneumoniae] as the cause of diseases classified elsewhere: Secondary | ICD-10-CM | POA: Diagnosis not present

## 2015-08-01 DIAGNOSIS — I69391 Dysphagia following cerebral infarction: Secondary | ICD-10-CM | POA: Diagnosis not present

## 2015-08-01 DIAGNOSIS — I69320 Aphasia following cerebral infarction: Secondary | ICD-10-CM | POA: Diagnosis not present

## 2015-08-02 DIAGNOSIS — E119 Type 2 diabetes mellitus without complications: Secondary | ICD-10-CM | POA: Diagnosis not present

## 2015-08-02 DIAGNOSIS — L89151 Pressure ulcer of sacral region, stage 1: Secondary | ICD-10-CM | POA: Diagnosis not present

## 2015-08-02 DIAGNOSIS — I69391 Dysphagia following cerebral infarction: Secondary | ICD-10-CM | POA: Diagnosis not present

## 2015-08-02 DIAGNOSIS — N390 Urinary tract infection, site not specified: Secondary | ICD-10-CM | POA: Diagnosis not present

## 2015-08-02 DIAGNOSIS — B961 Klebsiella pneumoniae [K. pneumoniae] as the cause of diseases classified elsewhere: Secondary | ICD-10-CM | POA: Diagnosis not present

## 2015-08-02 DIAGNOSIS — I69320 Aphasia following cerebral infarction: Secondary | ICD-10-CM | POA: Diagnosis not present

## 2015-08-05 ENCOUNTER — Other Ambulatory Visit: Payer: Self-pay | Admitting: Internal Medicine

## 2015-08-08 DIAGNOSIS — E119 Type 2 diabetes mellitus without complications: Secondary | ICD-10-CM | POA: Diagnosis not present

## 2015-08-08 DIAGNOSIS — N390 Urinary tract infection, site not specified: Secondary | ICD-10-CM | POA: Diagnosis not present

## 2015-08-08 DIAGNOSIS — L89151 Pressure ulcer of sacral region, stage 1: Secondary | ICD-10-CM | POA: Diagnosis not present

## 2015-08-08 DIAGNOSIS — B961 Klebsiella pneumoniae [K. pneumoniae] as the cause of diseases classified elsewhere: Secondary | ICD-10-CM | POA: Diagnosis not present

## 2015-08-08 DIAGNOSIS — I69320 Aphasia following cerebral infarction: Secondary | ICD-10-CM | POA: Diagnosis not present

## 2015-08-08 DIAGNOSIS — I69391 Dysphagia following cerebral infarction: Secondary | ICD-10-CM | POA: Diagnosis not present

## 2015-08-11 ENCOUNTER — Other Ambulatory Visit: Payer: Self-pay | Admitting: Internal Medicine

## 2015-08-14 DIAGNOSIS — E119 Type 2 diabetes mellitus without complications: Secondary | ICD-10-CM | POA: Diagnosis not present

## 2015-08-14 DIAGNOSIS — I69320 Aphasia following cerebral infarction: Secondary | ICD-10-CM | POA: Diagnosis not present

## 2015-08-14 DIAGNOSIS — N390 Urinary tract infection, site not specified: Secondary | ICD-10-CM | POA: Diagnosis not present

## 2015-08-14 DIAGNOSIS — I69391 Dysphagia following cerebral infarction: Secondary | ICD-10-CM | POA: Diagnosis not present

## 2015-08-14 DIAGNOSIS — B961 Klebsiella pneumoniae [K. pneumoniae] as the cause of diseases classified elsewhere: Secondary | ICD-10-CM | POA: Diagnosis not present

## 2015-08-14 DIAGNOSIS — L89151 Pressure ulcer of sacral region, stage 1: Secondary | ICD-10-CM | POA: Diagnosis not present

## 2015-08-15 DIAGNOSIS — B961 Klebsiella pneumoniae [K. pneumoniae] as the cause of diseases classified elsewhere: Secondary | ICD-10-CM | POA: Diagnosis not present

## 2015-08-15 DIAGNOSIS — E119 Type 2 diabetes mellitus without complications: Secondary | ICD-10-CM | POA: Diagnosis not present

## 2015-08-15 DIAGNOSIS — N390 Urinary tract infection, site not specified: Secondary | ICD-10-CM | POA: Diagnosis not present

## 2015-08-15 DIAGNOSIS — I69320 Aphasia following cerebral infarction: Secondary | ICD-10-CM | POA: Diagnosis not present

## 2015-08-15 DIAGNOSIS — I69391 Dysphagia following cerebral infarction: Secondary | ICD-10-CM | POA: Diagnosis not present

## 2015-08-15 DIAGNOSIS — L89151 Pressure ulcer of sacral region, stage 1: Secondary | ICD-10-CM | POA: Diagnosis not present

## 2015-08-16 DIAGNOSIS — L89151 Pressure ulcer of sacral region, stage 1: Secondary | ICD-10-CM | POA: Diagnosis not present

## 2015-08-16 DIAGNOSIS — I69320 Aphasia following cerebral infarction: Secondary | ICD-10-CM | POA: Diagnosis not present

## 2015-08-16 DIAGNOSIS — E119 Type 2 diabetes mellitus without complications: Secondary | ICD-10-CM | POA: Diagnosis not present

## 2015-08-16 DIAGNOSIS — B961 Klebsiella pneumoniae [K. pneumoniae] as the cause of diseases classified elsewhere: Secondary | ICD-10-CM | POA: Diagnosis not present

## 2015-08-16 DIAGNOSIS — I69391 Dysphagia following cerebral infarction: Secondary | ICD-10-CM | POA: Diagnosis not present

## 2015-08-16 DIAGNOSIS — N390 Urinary tract infection, site not specified: Secondary | ICD-10-CM | POA: Diagnosis not present

## 2015-08-21 DIAGNOSIS — E119 Type 2 diabetes mellitus without complications: Secondary | ICD-10-CM | POA: Diagnosis not present

## 2015-08-21 DIAGNOSIS — L89151 Pressure ulcer of sacral region, stage 1: Secondary | ICD-10-CM | POA: Diagnosis not present

## 2015-08-21 DIAGNOSIS — N390 Urinary tract infection, site not specified: Secondary | ICD-10-CM | POA: Diagnosis not present

## 2015-08-21 DIAGNOSIS — B961 Klebsiella pneumoniae [K. pneumoniae] as the cause of diseases classified elsewhere: Secondary | ICD-10-CM | POA: Diagnosis not present

## 2015-08-21 DIAGNOSIS — I69320 Aphasia following cerebral infarction: Secondary | ICD-10-CM | POA: Diagnosis not present

## 2015-08-21 DIAGNOSIS — I69391 Dysphagia following cerebral infarction: Secondary | ICD-10-CM | POA: Diagnosis not present

## 2015-08-22 DIAGNOSIS — R0789 Other chest pain: Secondary | ICD-10-CM | POA: Diagnosis not present

## 2015-08-22 DIAGNOSIS — I10 Essential (primary) hypertension: Secondary | ICD-10-CM | POA: Diagnosis not present

## 2015-08-22 DIAGNOSIS — Z8673 Personal history of transient ischemic attack (TIA), and cerebral infarction without residual deficits: Secondary | ICD-10-CM | POA: Diagnosis not present

## 2015-08-22 DIAGNOSIS — E1065 Type 1 diabetes mellitus with hyperglycemia: Secondary | ICD-10-CM | POA: Diagnosis not present

## 2015-08-22 DIAGNOSIS — Z931 Gastrostomy status: Secondary | ICD-10-CM | POA: Diagnosis not present

## 2015-08-22 DIAGNOSIS — R079 Chest pain, unspecified: Secondary | ICD-10-CM | POA: Diagnosis not present

## 2015-08-22 DIAGNOSIS — I69321 Dysphasia following cerebral infarction: Secondary | ICD-10-CM | POA: Diagnosis not present

## 2015-08-22 DIAGNOSIS — Z7901 Long term (current) use of anticoagulants: Secondary | ICD-10-CM | POA: Diagnosis not present

## 2015-08-22 DIAGNOSIS — E1165 Type 2 diabetes mellitus with hyperglycemia: Secondary | ICD-10-CM | POA: Diagnosis not present

## 2015-08-22 DIAGNOSIS — I5022 Chronic systolic (congestive) heart failure: Secondary | ICD-10-CM | POA: Diagnosis not present

## 2015-08-22 DIAGNOSIS — I69351 Hemiplegia and hemiparesis following cerebral infarction affecting right dominant side: Secondary | ICD-10-CM | POA: Diagnosis not present

## 2015-08-22 DIAGNOSIS — I2510 Atherosclerotic heart disease of native coronary artery without angina pectoris: Secondary | ICD-10-CM | POA: Diagnosis not present

## 2015-08-22 DIAGNOSIS — Z794 Long term (current) use of insulin: Secondary | ICD-10-CM | POA: Diagnosis not present

## 2015-08-22 DIAGNOSIS — E785 Hyperlipidemia, unspecified: Secondary | ICD-10-CM | POA: Diagnosis not present

## 2015-08-22 DIAGNOSIS — I252 Old myocardial infarction: Secondary | ICD-10-CM | POA: Diagnosis not present

## 2015-08-22 DIAGNOSIS — Z9119 Patient's noncompliance with other medical treatment and regimen: Secondary | ICD-10-CM | POA: Diagnosis not present

## 2015-08-22 DIAGNOSIS — R072 Precordial pain: Secondary | ICD-10-CM | POA: Diagnosis not present

## 2015-08-22 DIAGNOSIS — Z951 Presence of aortocoronary bypass graft: Secondary | ICD-10-CM | POA: Diagnosis not present

## 2015-08-22 DIAGNOSIS — Z79899 Other long term (current) drug therapy: Secondary | ICD-10-CM | POA: Diagnosis not present

## 2015-08-22 DIAGNOSIS — I2582 Chronic total occlusion of coronary artery: Secondary | ICD-10-CM | POA: Diagnosis not present

## 2015-08-22 DIAGNOSIS — I110 Hypertensive heart disease with heart failure: Secondary | ICD-10-CM | POA: Diagnosis not present

## 2015-08-22 DIAGNOSIS — Z87891 Personal history of nicotine dependence: Secondary | ICD-10-CM | POA: Diagnosis not present

## 2015-08-23 DIAGNOSIS — E1065 Type 1 diabetes mellitus with hyperglycemia: Secondary | ICD-10-CM | POA: Diagnosis not present

## 2015-08-23 DIAGNOSIS — I2510 Atherosclerotic heart disease of native coronary artery without angina pectoris: Secondary | ICD-10-CM | POA: Diagnosis not present

## 2015-08-23 DIAGNOSIS — I5022 Chronic systolic (congestive) heart failure: Secondary | ICD-10-CM | POA: Diagnosis not present

## 2015-08-23 DIAGNOSIS — E785 Hyperlipidemia, unspecified: Secondary | ICD-10-CM | POA: Diagnosis not present

## 2015-08-23 DIAGNOSIS — I517 Cardiomegaly: Secondary | ICD-10-CM | POA: Diagnosis not present

## 2015-08-23 DIAGNOSIS — R0789 Other chest pain: Secondary | ICD-10-CM | POA: Diagnosis not present

## 2015-08-23 DIAGNOSIS — I10 Essential (primary) hypertension: Secondary | ICD-10-CM | POA: Diagnosis not present

## 2015-08-23 DIAGNOSIS — Z7901 Long term (current) use of anticoagulants: Secondary | ICD-10-CM | POA: Diagnosis not present

## 2015-08-23 DIAGNOSIS — Z8673 Personal history of transient ischemic attack (TIA), and cerebral infarction without residual deficits: Secondary | ICD-10-CM | POA: Diagnosis not present

## 2015-08-23 DIAGNOSIS — E108 Type 1 diabetes mellitus with unspecified complications: Secondary | ICD-10-CM | POA: Diagnosis not present

## 2015-08-23 DIAGNOSIS — R072 Precordial pain: Secondary | ICD-10-CM | POA: Diagnosis not present

## 2015-08-28 DIAGNOSIS — L89151 Pressure ulcer of sacral region, stage 1: Secondary | ICD-10-CM | POA: Diagnosis not present

## 2015-08-28 DIAGNOSIS — N390 Urinary tract infection, site not specified: Secondary | ICD-10-CM | POA: Diagnosis not present

## 2015-08-28 DIAGNOSIS — I69320 Aphasia following cerebral infarction: Secondary | ICD-10-CM | POA: Diagnosis not present

## 2015-08-28 DIAGNOSIS — I69391 Dysphagia following cerebral infarction: Secondary | ICD-10-CM | POA: Diagnosis not present

## 2015-08-28 DIAGNOSIS — E119 Type 2 diabetes mellitus without complications: Secondary | ICD-10-CM | POA: Diagnosis not present

## 2015-08-28 DIAGNOSIS — B961 Klebsiella pneumoniae [K. pneumoniae] as the cause of diseases classified elsewhere: Secondary | ICD-10-CM | POA: Diagnosis not present

## 2015-08-29 DIAGNOSIS — E119 Type 2 diabetes mellitus without complications: Secondary | ICD-10-CM | POA: Diagnosis not present

## 2015-08-29 DIAGNOSIS — B961 Klebsiella pneumoniae [K. pneumoniae] as the cause of diseases classified elsewhere: Secondary | ICD-10-CM | POA: Diagnosis not present

## 2015-08-29 DIAGNOSIS — L89151 Pressure ulcer of sacral region, stage 1: Secondary | ICD-10-CM | POA: Diagnosis not present

## 2015-08-29 DIAGNOSIS — I69391 Dysphagia following cerebral infarction: Secondary | ICD-10-CM | POA: Diagnosis not present

## 2015-08-29 DIAGNOSIS — N390 Urinary tract infection, site not specified: Secondary | ICD-10-CM | POA: Diagnosis not present

## 2015-08-29 DIAGNOSIS — I69320 Aphasia following cerebral infarction: Secondary | ICD-10-CM | POA: Diagnosis not present

## 2015-08-31 DIAGNOSIS — B961 Klebsiella pneumoniae [K. pneumoniae] as the cause of diseases classified elsewhere: Secondary | ICD-10-CM | POA: Diagnosis not present

## 2015-08-31 DIAGNOSIS — N390 Urinary tract infection, site not specified: Secondary | ICD-10-CM | POA: Diagnosis not present

## 2015-08-31 DIAGNOSIS — L89151 Pressure ulcer of sacral region, stage 1: Secondary | ICD-10-CM | POA: Diagnosis not present

## 2015-08-31 DIAGNOSIS — I69391 Dysphagia following cerebral infarction: Secondary | ICD-10-CM | POA: Diagnosis not present

## 2015-08-31 DIAGNOSIS — I69320 Aphasia following cerebral infarction: Secondary | ICD-10-CM | POA: Diagnosis not present

## 2015-08-31 DIAGNOSIS — E119 Type 2 diabetes mellitus without complications: Secondary | ICD-10-CM | POA: Diagnosis not present

## 2015-09-04 DIAGNOSIS — L89151 Pressure ulcer of sacral region, stage 1: Secondary | ICD-10-CM | POA: Diagnosis not present

## 2015-09-04 DIAGNOSIS — B961 Klebsiella pneumoniae [K. pneumoniae] as the cause of diseases classified elsewhere: Secondary | ICD-10-CM | POA: Diagnosis not present

## 2015-09-04 DIAGNOSIS — I69391 Dysphagia following cerebral infarction: Secondary | ICD-10-CM | POA: Diagnosis not present

## 2015-09-04 DIAGNOSIS — I69320 Aphasia following cerebral infarction: Secondary | ICD-10-CM | POA: Diagnosis not present

## 2015-09-04 DIAGNOSIS — E119 Type 2 diabetes mellitus without complications: Secondary | ICD-10-CM | POA: Diagnosis not present

## 2015-09-04 DIAGNOSIS — N390 Urinary tract infection, site not specified: Secondary | ICD-10-CM | POA: Diagnosis not present

## 2015-09-06 DIAGNOSIS — E119 Type 2 diabetes mellitus without complications: Secondary | ICD-10-CM | POA: Diagnosis not present

## 2015-09-06 DIAGNOSIS — I69320 Aphasia following cerebral infarction: Secondary | ICD-10-CM | POA: Diagnosis not present

## 2015-09-06 DIAGNOSIS — L89151 Pressure ulcer of sacral region, stage 1: Secondary | ICD-10-CM | POA: Diagnosis not present

## 2015-09-06 DIAGNOSIS — I69391 Dysphagia following cerebral infarction: Secondary | ICD-10-CM | POA: Diagnosis not present

## 2015-09-06 DIAGNOSIS — N390 Urinary tract infection, site not specified: Secondary | ICD-10-CM | POA: Diagnosis not present

## 2015-09-06 DIAGNOSIS — B961 Klebsiella pneumoniae [K. pneumoniae] as the cause of diseases classified elsewhere: Secondary | ICD-10-CM | POA: Diagnosis not present

## 2015-09-07 DIAGNOSIS — I2510 Atherosclerotic heart disease of native coronary artery without angina pectoris: Secondary | ICD-10-CM | POA: Diagnosis not present

## 2015-09-07 DIAGNOSIS — Z7901 Long term (current) use of anticoagulants: Secondary | ICD-10-CM | POA: Diagnosis not present

## 2015-09-07 DIAGNOSIS — I6930 Unspecified sequelae of cerebral infarction: Secondary | ICD-10-CM | POA: Diagnosis not present

## 2015-09-07 DIAGNOSIS — I83019 Varicose veins of right lower extremity with ulcer of unspecified site: Secondary | ICD-10-CM | POA: Diagnosis not present

## 2015-09-07 DIAGNOSIS — I10 Essential (primary) hypertension: Secondary | ICD-10-CM | POA: Diagnosis not present

## 2015-09-07 DIAGNOSIS — E1165 Type 2 diabetes mellitus with hyperglycemia: Secondary | ICD-10-CM | POA: Diagnosis not present

## 2015-09-11 DIAGNOSIS — N390 Urinary tract infection, site not specified: Secondary | ICD-10-CM | POA: Diagnosis not present

## 2015-09-11 DIAGNOSIS — I69391 Dysphagia following cerebral infarction: Secondary | ICD-10-CM | POA: Diagnosis not present

## 2015-09-11 DIAGNOSIS — B961 Klebsiella pneumoniae [K. pneumoniae] as the cause of diseases classified elsewhere: Secondary | ICD-10-CM | POA: Diagnosis not present

## 2015-09-11 DIAGNOSIS — L89151 Pressure ulcer of sacral region, stage 1: Secondary | ICD-10-CM | POA: Diagnosis not present

## 2015-09-11 DIAGNOSIS — E119 Type 2 diabetes mellitus without complications: Secondary | ICD-10-CM | POA: Diagnosis not present

## 2015-09-11 DIAGNOSIS — I69320 Aphasia following cerebral infarction: Secondary | ICD-10-CM | POA: Diagnosis not present

## 2015-09-13 DIAGNOSIS — R0602 Shortness of breath: Secondary | ICD-10-CM | POA: Diagnosis not present

## 2015-09-13 DIAGNOSIS — E119 Type 2 diabetes mellitus without complications: Secondary | ICD-10-CM | POA: Diagnosis not present

## 2015-09-13 DIAGNOSIS — R251 Tremor, unspecified: Secondary | ICD-10-CM | POA: Diagnosis not present

## 2015-09-13 DIAGNOSIS — R402431 Glasgow coma scale score 3-8, in the field [EMT or ambulance]: Secondary | ICD-10-CM | POA: Diagnosis not present

## 2015-09-13 DIAGNOSIS — N3000 Acute cystitis without hematuria: Secondary | ICD-10-CM | POA: Diagnosis not present

## 2015-09-13 DIAGNOSIS — Z7901 Long term (current) use of anticoagulants: Secondary | ICD-10-CM | POA: Diagnosis not present

## 2015-09-13 DIAGNOSIS — G9389 Other specified disorders of brain: Secondary | ICD-10-CM | POA: Diagnosis not present

## 2015-09-13 DIAGNOSIS — I252 Old myocardial infarction: Secondary | ICD-10-CM | POA: Diagnosis not present

## 2015-09-13 DIAGNOSIS — Z794 Long term (current) use of insulin: Secondary | ICD-10-CM | POA: Diagnosis not present

## 2015-09-13 DIAGNOSIS — Z7982 Long term (current) use of aspirin: Secondary | ICD-10-CM | POA: Diagnosis not present

## 2015-09-13 DIAGNOSIS — E785 Hyperlipidemia, unspecified: Secondary | ICD-10-CM | POA: Diagnosis not present

## 2015-09-13 DIAGNOSIS — M6258 Muscle wasting and atrophy, not elsewhere classified, other site: Secondary | ICD-10-CM | POA: Diagnosis not present

## 2015-09-13 DIAGNOSIS — D649 Anemia, unspecified: Secondary | ICD-10-CM | POA: Diagnosis not present

## 2015-09-13 DIAGNOSIS — Z8673 Personal history of transient ischemic attack (TIA), and cerebral infarction without residual deficits: Secondary | ICD-10-CM | POA: Diagnosis not present

## 2015-09-13 DIAGNOSIS — Z87891 Personal history of nicotine dependence: Secondary | ICD-10-CM | POA: Diagnosis not present

## 2015-09-13 DIAGNOSIS — R05 Cough: Secondary | ICD-10-CM | POA: Diagnosis not present

## 2015-09-13 DIAGNOSIS — I10 Essential (primary) hypertension: Secondary | ICD-10-CM | POA: Diagnosis not present

## 2015-09-13 DIAGNOSIS — R569 Unspecified convulsions: Secondary | ICD-10-CM | POA: Diagnosis not present

## 2015-09-13 DIAGNOSIS — I2510 Atherosclerotic heart disease of native coronary artery without angina pectoris: Secondary | ICD-10-CM | POA: Diagnosis not present

## 2015-09-13 DIAGNOSIS — Z9119 Patient's noncompliance with other medical treatment and regimen: Secondary | ICD-10-CM | POA: Diagnosis not present

## 2015-09-14 ENCOUNTER — Other Ambulatory Visit: Payer: Self-pay | Admitting: Internal Medicine

## 2015-09-14 DIAGNOSIS — I69320 Aphasia following cerebral infarction: Secondary | ICD-10-CM | POA: Diagnosis not present

## 2015-09-14 DIAGNOSIS — N390 Urinary tract infection, site not specified: Secondary | ICD-10-CM | POA: Diagnosis not present

## 2015-09-14 DIAGNOSIS — E119 Type 2 diabetes mellitus without complications: Secondary | ICD-10-CM | POA: Diagnosis not present

## 2015-09-14 DIAGNOSIS — B961 Klebsiella pneumoniae [K. pneumoniae] as the cause of diseases classified elsewhere: Secondary | ICD-10-CM | POA: Diagnosis not present

## 2015-09-14 DIAGNOSIS — I69391 Dysphagia following cerebral infarction: Secondary | ICD-10-CM | POA: Diagnosis not present

## 2015-09-14 DIAGNOSIS — L89151 Pressure ulcer of sacral region, stage 1: Secondary | ICD-10-CM | POA: Diagnosis not present

## 2015-09-17 DIAGNOSIS — L89151 Pressure ulcer of sacral region, stage 1: Secondary | ICD-10-CM | POA: Diagnosis not present

## 2015-09-17 DIAGNOSIS — E119 Type 2 diabetes mellitus without complications: Secondary | ICD-10-CM | POA: Diagnosis not present

## 2015-09-17 DIAGNOSIS — I69320 Aphasia following cerebral infarction: Secondary | ICD-10-CM | POA: Diagnosis not present

## 2015-09-17 DIAGNOSIS — B961 Klebsiella pneumoniae [K. pneumoniae] as the cause of diseases classified elsewhere: Secondary | ICD-10-CM | POA: Diagnosis not present

## 2015-09-17 DIAGNOSIS — I69391 Dysphagia following cerebral infarction: Secondary | ICD-10-CM | POA: Diagnosis not present

## 2015-09-17 DIAGNOSIS — N390 Urinary tract infection, site not specified: Secondary | ICD-10-CM | POA: Diagnosis not present

## 2015-09-19 DIAGNOSIS — E119 Type 2 diabetes mellitus without complications: Secondary | ICD-10-CM | POA: Diagnosis not present

## 2015-09-19 DIAGNOSIS — N390 Urinary tract infection, site not specified: Secondary | ICD-10-CM | POA: Diagnosis not present

## 2015-09-19 DIAGNOSIS — B961 Klebsiella pneumoniae [K. pneumoniae] as the cause of diseases classified elsewhere: Secondary | ICD-10-CM | POA: Diagnosis not present

## 2015-09-19 DIAGNOSIS — L89151 Pressure ulcer of sacral region, stage 1: Secondary | ICD-10-CM | POA: Diagnosis not present

## 2015-09-19 DIAGNOSIS — I69391 Dysphagia following cerebral infarction: Secondary | ICD-10-CM | POA: Diagnosis not present

## 2015-09-19 DIAGNOSIS — I69320 Aphasia following cerebral infarction: Secondary | ICD-10-CM | POA: Diagnosis not present

## 2015-09-20 DIAGNOSIS — L89151 Pressure ulcer of sacral region, stage 1: Secondary | ICD-10-CM | POA: Diagnosis not present

## 2015-09-20 DIAGNOSIS — I69320 Aphasia following cerebral infarction: Secondary | ICD-10-CM | POA: Diagnosis not present

## 2015-09-20 DIAGNOSIS — I69391 Dysphagia following cerebral infarction: Secondary | ICD-10-CM | POA: Diagnosis not present

## 2015-09-20 DIAGNOSIS — E119 Type 2 diabetes mellitus without complications: Secondary | ICD-10-CM | POA: Diagnosis not present

## 2015-09-20 DIAGNOSIS — B961 Klebsiella pneumoniae [K. pneumoniae] as the cause of diseases classified elsewhere: Secondary | ICD-10-CM | POA: Diagnosis not present

## 2015-09-20 DIAGNOSIS — N390 Urinary tract infection, site not specified: Secondary | ICD-10-CM | POA: Diagnosis not present

## 2015-09-21 DIAGNOSIS — N390 Urinary tract infection, site not specified: Secondary | ICD-10-CM | POA: Diagnosis not present

## 2015-09-21 DIAGNOSIS — I69391 Dysphagia following cerebral infarction: Secondary | ICD-10-CM | POA: Diagnosis not present

## 2015-09-21 DIAGNOSIS — E119 Type 2 diabetes mellitus without complications: Secondary | ICD-10-CM | POA: Diagnosis not present

## 2015-09-21 DIAGNOSIS — B961 Klebsiella pneumoniae [K. pneumoniae] as the cause of diseases classified elsewhere: Secondary | ICD-10-CM | POA: Diagnosis not present

## 2015-09-21 DIAGNOSIS — I69320 Aphasia following cerebral infarction: Secondary | ICD-10-CM | POA: Diagnosis not present

## 2015-09-21 DIAGNOSIS — L89151 Pressure ulcer of sacral region, stage 1: Secondary | ICD-10-CM | POA: Diagnosis not present

## 2015-09-24 DIAGNOSIS — L89892 Pressure ulcer of other site, stage 2: Secondary | ICD-10-CM | POA: Diagnosis not present

## 2015-09-24 DIAGNOSIS — Z794 Long term (current) use of insulin: Secondary | ICD-10-CM | POA: Diagnosis not present

## 2015-09-24 DIAGNOSIS — I82401 Acute embolism and thrombosis of unspecified deep veins of right lower extremity: Secondary | ICD-10-CM | POA: Diagnosis not present

## 2015-09-24 DIAGNOSIS — G40909 Epilepsy, unspecified, not intractable, without status epilepticus: Secondary | ICD-10-CM | POA: Diagnosis not present

## 2015-09-24 DIAGNOSIS — I5022 Chronic systolic (congestive) heart failure: Secondary | ICD-10-CM | POA: Diagnosis not present

## 2015-09-24 DIAGNOSIS — F0631 Mood disorder due to known physiological condition with depressive features: Secondary | ICD-10-CM | POA: Diagnosis not present

## 2015-09-24 DIAGNOSIS — L89890 Pressure ulcer of other site, unstageable: Secondary | ICD-10-CM | POA: Diagnosis not present

## 2015-09-24 DIAGNOSIS — I69391 Dysphagia following cerebral infarction: Secondary | ICD-10-CM | POA: Diagnosis not present

## 2015-09-24 DIAGNOSIS — I69398 Other sequelae of cerebral infarction: Secondary | ICD-10-CM | POA: Diagnosis not present

## 2015-09-24 DIAGNOSIS — I69320 Aphasia following cerebral infarction: Secondary | ICD-10-CM | POA: Diagnosis not present

## 2015-09-24 DIAGNOSIS — E119 Type 2 diabetes mellitus without complications: Secondary | ICD-10-CM | POA: Diagnosis not present

## 2015-09-24 DIAGNOSIS — Z431 Encounter for attention to gastrostomy: Secondary | ICD-10-CM | POA: Diagnosis not present

## 2015-09-24 DIAGNOSIS — I110 Hypertensive heart disease with heart failure: Secondary | ICD-10-CM | POA: Diagnosis not present

## 2015-09-24 DIAGNOSIS — E46 Unspecified protein-calorie malnutrition: Secondary | ICD-10-CM | POA: Diagnosis not present

## 2015-09-25 DIAGNOSIS — R1312 Dysphagia, oropharyngeal phase: Secondary | ICD-10-CM | POA: Diagnosis not present

## 2015-09-25 DIAGNOSIS — E119 Type 2 diabetes mellitus without complications: Secondary | ICD-10-CM | POA: Diagnosis not present

## 2015-09-25 DIAGNOSIS — I110 Hypertensive heart disease with heart failure: Secondary | ICD-10-CM | POA: Diagnosis not present

## 2015-09-25 DIAGNOSIS — I69320 Aphasia following cerebral infarction: Secondary | ICD-10-CM | POA: Diagnosis not present

## 2015-09-25 DIAGNOSIS — L89892 Pressure ulcer of other site, stage 2: Secondary | ICD-10-CM | POA: Diagnosis not present

## 2015-09-25 DIAGNOSIS — I69391 Dysphagia following cerebral infarction: Secondary | ICD-10-CM | POA: Diagnosis not present

## 2015-09-25 DIAGNOSIS — L89890 Pressure ulcer of other site, unstageable: Secondary | ICD-10-CM | POA: Diagnosis not present

## 2015-09-27 DIAGNOSIS — I69391 Dysphagia following cerebral infarction: Secondary | ICD-10-CM | POA: Diagnosis not present

## 2015-09-27 DIAGNOSIS — E119 Type 2 diabetes mellitus without complications: Secondary | ICD-10-CM | POA: Diagnosis not present

## 2015-09-27 DIAGNOSIS — I69320 Aphasia following cerebral infarction: Secondary | ICD-10-CM | POA: Diagnosis not present

## 2015-09-27 DIAGNOSIS — I110 Hypertensive heart disease with heart failure: Secondary | ICD-10-CM | POA: Diagnosis not present

## 2015-09-27 DIAGNOSIS — L89892 Pressure ulcer of other site, stage 2: Secondary | ICD-10-CM | POA: Diagnosis not present

## 2015-09-27 DIAGNOSIS — L89890 Pressure ulcer of other site, unstageable: Secondary | ICD-10-CM | POA: Diagnosis not present

## 2015-09-28 DIAGNOSIS — I110 Hypertensive heart disease with heart failure: Secondary | ICD-10-CM | POA: Diagnosis not present

## 2015-09-28 DIAGNOSIS — L89890 Pressure ulcer of other site, unstageable: Secondary | ICD-10-CM | POA: Diagnosis not present

## 2015-09-28 DIAGNOSIS — L89892 Pressure ulcer of other site, stage 2: Secondary | ICD-10-CM | POA: Diagnosis not present

## 2015-09-28 DIAGNOSIS — I69320 Aphasia following cerebral infarction: Secondary | ICD-10-CM | POA: Diagnosis not present

## 2015-09-28 DIAGNOSIS — I69391 Dysphagia following cerebral infarction: Secondary | ICD-10-CM | POA: Diagnosis not present

## 2015-09-28 DIAGNOSIS — E119 Type 2 diabetes mellitus without complications: Secondary | ICD-10-CM | POA: Diagnosis not present

## 2015-09-30 DIAGNOSIS — K9420 Gastrostomy complication, unspecified: Secondary | ICD-10-CM | POA: Diagnosis not present

## 2015-09-30 DIAGNOSIS — I10 Essential (primary) hypertension: Secondary | ICD-10-CM | POA: Diagnosis not present

## 2015-09-30 DIAGNOSIS — I2510 Atherosclerotic heart disease of native coronary artery without angina pectoris: Secondary | ICD-10-CM | POA: Diagnosis not present

## 2015-09-30 DIAGNOSIS — Z931 Gastrostomy status: Secondary | ICD-10-CM | POA: Diagnosis not present

## 2015-09-30 DIAGNOSIS — Z8673 Personal history of transient ischemic attack (TIA), and cerebral infarction without residual deficits: Secondary | ICD-10-CM | POA: Diagnosis not present

## 2015-09-30 DIAGNOSIS — Z87891 Personal history of nicotine dependence: Secondary | ICD-10-CM | POA: Diagnosis not present

## 2015-09-30 DIAGNOSIS — E785 Hyperlipidemia, unspecified: Secondary | ICD-10-CM | POA: Diagnosis not present

## 2015-09-30 DIAGNOSIS — Z431 Encounter for attention to gastrostomy: Secondary | ICD-10-CM | POA: Diagnosis not present

## 2015-09-30 DIAGNOSIS — E119 Type 2 diabetes mellitus without complications: Secondary | ICD-10-CM | POA: Diagnosis not present

## 2015-09-30 DIAGNOSIS — R633 Feeding difficulties: Secondary | ICD-10-CM | POA: Diagnosis not present

## 2015-09-30 DIAGNOSIS — I252 Old myocardial infarction: Secondary | ICD-10-CM | POA: Diagnosis not present

## 2015-09-30 DIAGNOSIS — Z9114 Patient's other noncompliance with medication regimen: Secondary | ICD-10-CM | POA: Diagnosis not present

## 2015-10-01 DIAGNOSIS — I69391 Dysphagia following cerebral infarction: Secondary | ICD-10-CM | POA: Diagnosis not present

## 2015-10-01 DIAGNOSIS — L89892 Pressure ulcer of other site, stage 2: Secondary | ICD-10-CM | POA: Diagnosis not present

## 2015-10-01 DIAGNOSIS — L89890 Pressure ulcer of other site, unstageable: Secondary | ICD-10-CM | POA: Diagnosis not present

## 2015-10-01 DIAGNOSIS — I69320 Aphasia following cerebral infarction: Secondary | ICD-10-CM | POA: Diagnosis not present

## 2015-10-01 DIAGNOSIS — E119 Type 2 diabetes mellitus without complications: Secondary | ICD-10-CM | POA: Diagnosis not present

## 2015-10-01 DIAGNOSIS — I110 Hypertensive heart disease with heart failure: Secondary | ICD-10-CM | POA: Diagnosis not present

## 2015-10-02 DIAGNOSIS — L89890 Pressure ulcer of other site, unstageable: Secondary | ICD-10-CM | POA: Diagnosis not present

## 2015-10-02 DIAGNOSIS — L89892 Pressure ulcer of other site, stage 2: Secondary | ICD-10-CM | POA: Diagnosis not present

## 2015-10-02 DIAGNOSIS — I110 Hypertensive heart disease with heart failure: Secondary | ICD-10-CM | POA: Diagnosis not present

## 2015-10-02 DIAGNOSIS — I69391 Dysphagia following cerebral infarction: Secondary | ICD-10-CM | POA: Diagnosis not present

## 2015-10-02 DIAGNOSIS — E119 Type 2 diabetes mellitus without complications: Secondary | ICD-10-CM | POA: Diagnosis not present

## 2015-10-02 DIAGNOSIS — I69320 Aphasia following cerebral infarction: Secondary | ICD-10-CM | POA: Diagnosis not present

## 2015-10-03 DIAGNOSIS — E119 Type 2 diabetes mellitus without complications: Secondary | ICD-10-CM | POA: Diagnosis not present

## 2015-10-03 DIAGNOSIS — I69320 Aphasia following cerebral infarction: Secondary | ICD-10-CM | POA: Diagnosis not present

## 2015-10-03 DIAGNOSIS — L89892 Pressure ulcer of other site, stage 2: Secondary | ICD-10-CM | POA: Diagnosis not present

## 2015-10-03 DIAGNOSIS — I69391 Dysphagia following cerebral infarction: Secondary | ICD-10-CM | POA: Diagnosis not present

## 2015-10-03 DIAGNOSIS — L89890 Pressure ulcer of other site, unstageable: Secondary | ICD-10-CM | POA: Diagnosis not present

## 2015-10-03 DIAGNOSIS — I110 Hypertensive heart disease with heart failure: Secondary | ICD-10-CM | POA: Diagnosis not present

## 2015-10-04 DIAGNOSIS — L89890 Pressure ulcer of other site, unstageable: Secondary | ICD-10-CM | POA: Diagnosis not present

## 2015-10-04 DIAGNOSIS — I69320 Aphasia following cerebral infarction: Secondary | ICD-10-CM | POA: Diagnosis not present

## 2015-10-04 DIAGNOSIS — I110 Hypertensive heart disease with heart failure: Secondary | ICD-10-CM | POA: Diagnosis not present

## 2015-10-04 DIAGNOSIS — E119 Type 2 diabetes mellitus without complications: Secondary | ICD-10-CM | POA: Diagnosis not present

## 2015-10-04 DIAGNOSIS — I69391 Dysphagia following cerebral infarction: Secondary | ICD-10-CM | POA: Diagnosis not present

## 2015-10-04 DIAGNOSIS — L89892 Pressure ulcer of other site, stage 2: Secondary | ICD-10-CM | POA: Diagnosis not present

## 2015-10-08 DIAGNOSIS — L89892 Pressure ulcer of other site, stage 2: Secondary | ICD-10-CM | POA: Diagnosis not present

## 2015-10-08 DIAGNOSIS — I69320 Aphasia following cerebral infarction: Secondary | ICD-10-CM | POA: Diagnosis not present

## 2015-10-08 DIAGNOSIS — I110 Hypertensive heart disease with heart failure: Secondary | ICD-10-CM | POA: Diagnosis not present

## 2015-10-08 DIAGNOSIS — E119 Type 2 diabetes mellitus without complications: Secondary | ICD-10-CM | POA: Diagnosis not present

## 2015-10-08 DIAGNOSIS — L89890 Pressure ulcer of other site, unstageable: Secondary | ICD-10-CM | POA: Diagnosis not present

## 2015-10-08 DIAGNOSIS — I69391 Dysphagia following cerebral infarction: Secondary | ICD-10-CM | POA: Diagnosis not present

## 2015-10-10 DIAGNOSIS — E119 Type 2 diabetes mellitus without complications: Secondary | ICD-10-CM | POA: Diagnosis not present

## 2015-10-10 DIAGNOSIS — I69320 Aphasia following cerebral infarction: Secondary | ICD-10-CM | POA: Diagnosis not present

## 2015-10-10 DIAGNOSIS — L89890 Pressure ulcer of other site, unstageable: Secondary | ICD-10-CM | POA: Diagnosis not present

## 2015-10-10 DIAGNOSIS — L89892 Pressure ulcer of other site, stage 2: Secondary | ICD-10-CM | POA: Diagnosis not present

## 2015-10-10 DIAGNOSIS — I110 Hypertensive heart disease with heart failure: Secondary | ICD-10-CM | POA: Diagnosis not present

## 2015-10-10 DIAGNOSIS — I69391 Dysphagia following cerebral infarction: Secondary | ICD-10-CM | POA: Diagnosis not present

## 2015-10-11 DIAGNOSIS — L89892 Pressure ulcer of other site, stage 2: Secondary | ICD-10-CM | POA: Diagnosis not present

## 2015-10-11 DIAGNOSIS — E119 Type 2 diabetes mellitus without complications: Secondary | ICD-10-CM | POA: Diagnosis not present

## 2015-10-11 DIAGNOSIS — I69320 Aphasia following cerebral infarction: Secondary | ICD-10-CM | POA: Diagnosis not present

## 2015-10-11 DIAGNOSIS — L89890 Pressure ulcer of other site, unstageable: Secondary | ICD-10-CM | POA: Diagnosis not present

## 2015-10-11 DIAGNOSIS — I69391 Dysphagia following cerebral infarction: Secondary | ICD-10-CM | POA: Diagnosis not present

## 2015-10-11 DIAGNOSIS — I110 Hypertensive heart disease with heart failure: Secondary | ICD-10-CM | POA: Diagnosis not present

## 2015-10-13 ENCOUNTER — Other Ambulatory Visit: Payer: Self-pay | Admitting: Internal Medicine

## 2015-10-15 DIAGNOSIS — Z7901 Long term (current) use of anticoagulants: Secondary | ICD-10-CM | POA: Diagnosis not present

## 2015-10-15 DIAGNOSIS — E1165 Type 2 diabetes mellitus with hyperglycemia: Secondary | ICD-10-CM | POA: Diagnosis not present

## 2015-10-15 DIAGNOSIS — I4891 Unspecified atrial fibrillation: Secondary | ICD-10-CM | POA: Diagnosis not present

## 2015-10-15 DIAGNOSIS — I10 Essential (primary) hypertension: Secondary | ICD-10-CM | POA: Diagnosis not present

## 2015-10-15 DIAGNOSIS — E7800 Pure hypercholesterolemia, unspecified: Secondary | ICD-10-CM | POA: Diagnosis not present

## 2015-10-15 DIAGNOSIS — Z79899 Other long term (current) drug therapy: Secondary | ICD-10-CM | POA: Diagnosis not present

## 2015-10-16 DIAGNOSIS — I69320 Aphasia following cerebral infarction: Secondary | ICD-10-CM | POA: Diagnosis not present

## 2015-10-16 DIAGNOSIS — I69391 Dysphagia following cerebral infarction: Secondary | ICD-10-CM | POA: Diagnosis not present

## 2015-10-16 DIAGNOSIS — I110 Hypertensive heart disease with heart failure: Secondary | ICD-10-CM | POA: Diagnosis not present

## 2015-10-16 DIAGNOSIS — L89892 Pressure ulcer of other site, stage 2: Secondary | ICD-10-CM | POA: Diagnosis not present

## 2015-10-16 DIAGNOSIS — E119 Type 2 diabetes mellitus without complications: Secondary | ICD-10-CM | POA: Diagnosis not present

## 2015-10-16 DIAGNOSIS — L89890 Pressure ulcer of other site, unstageable: Secondary | ICD-10-CM | POA: Diagnosis not present

## 2015-10-17 DIAGNOSIS — L89892 Pressure ulcer of other site, stage 2: Secondary | ICD-10-CM | POA: Diagnosis not present

## 2015-10-17 DIAGNOSIS — I69320 Aphasia following cerebral infarction: Secondary | ICD-10-CM | POA: Diagnosis not present

## 2015-10-17 DIAGNOSIS — I110 Hypertensive heart disease with heart failure: Secondary | ICD-10-CM | POA: Diagnosis not present

## 2015-10-17 DIAGNOSIS — L89890 Pressure ulcer of other site, unstageable: Secondary | ICD-10-CM | POA: Diagnosis not present

## 2015-10-17 DIAGNOSIS — I69391 Dysphagia following cerebral infarction: Secondary | ICD-10-CM | POA: Diagnosis not present

## 2015-10-17 DIAGNOSIS — E119 Type 2 diabetes mellitus without complications: Secondary | ICD-10-CM | POA: Diagnosis not present

## 2015-10-18 DIAGNOSIS — Z7982 Long term (current) use of aspirin: Secondary | ICD-10-CM | POA: Diagnosis not present

## 2015-10-18 DIAGNOSIS — I69998 Other sequelae following unspecified cerebrovascular disease: Secondary | ICD-10-CM | POA: Diagnosis not present

## 2015-10-18 DIAGNOSIS — Z794 Long term (current) use of insulin: Secondary | ICD-10-CM | POA: Diagnosis not present

## 2015-10-18 DIAGNOSIS — I252 Old myocardial infarction: Secondary | ICD-10-CM | POA: Diagnosis not present

## 2015-10-18 DIAGNOSIS — I69920 Aphasia following unspecified cerebrovascular disease: Secondary | ICD-10-CM | POA: Diagnosis not present

## 2015-10-18 DIAGNOSIS — Z7401 Bed confinement status: Secondary | ICD-10-CM | POA: Diagnosis not present

## 2015-10-18 DIAGNOSIS — N390 Urinary tract infection, site not specified: Secondary | ICD-10-CM | POA: Diagnosis present

## 2015-10-18 DIAGNOSIS — R569 Unspecified convulsions: Secondary | ICD-10-CM | POA: Diagnosis not present

## 2015-10-18 DIAGNOSIS — I10 Essential (primary) hypertension: Secondary | ICD-10-CM | POA: Diagnosis present

## 2015-10-18 DIAGNOSIS — I110 Hypertensive heart disease with heart failure: Secondary | ICD-10-CM | POA: Diagnosis not present

## 2015-10-18 DIAGNOSIS — Z89011 Acquired absence of right thumb: Secondary | ICD-10-CM | POA: Diagnosis not present

## 2015-10-18 DIAGNOSIS — Z9114 Patient's other noncompliance with medication regimen: Secondary | ICD-10-CM | POA: Diagnosis not present

## 2015-10-18 DIAGNOSIS — G40909 Epilepsy, unspecified, not intractable, without status epilepticus: Secondary | ICD-10-CM | POA: Diagnosis present

## 2015-10-18 DIAGNOSIS — R400 Somnolence: Secondary | ICD-10-CM | POA: Diagnosis not present

## 2015-10-18 DIAGNOSIS — B952 Enterococcus as the cause of diseases classified elsewhere: Secondary | ICD-10-CM | POA: Diagnosis present

## 2015-10-18 DIAGNOSIS — I69391 Dysphagia following cerebral infarction: Secondary | ICD-10-CM | POA: Diagnosis not present

## 2015-10-18 DIAGNOSIS — L89892 Pressure ulcer of other site, stage 2: Secondary | ICD-10-CM | POA: Diagnosis not present

## 2015-10-18 DIAGNOSIS — I69351 Hemiplegia and hemiparesis following cerebral infarction affecting right dominant side: Secondary | ICD-10-CM | POA: Diagnosis not present

## 2015-10-18 DIAGNOSIS — B9689 Other specified bacterial agents as the cause of diseases classified elsewhere: Secondary | ICD-10-CM | POA: Diagnosis not present

## 2015-10-18 DIAGNOSIS — S0990XA Unspecified injury of head, initial encounter: Secondary | ICD-10-CM | POA: Diagnosis not present

## 2015-10-18 DIAGNOSIS — Z8249 Family history of ischemic heart disease and other diseases of the circulatory system: Secondary | ICD-10-CM | POA: Diagnosis not present

## 2015-10-18 DIAGNOSIS — R9431 Abnormal electrocardiogram [ECG] [EKG]: Secondary | ICD-10-CM | POA: Diagnosis not present

## 2015-10-18 DIAGNOSIS — I2510 Atherosclerotic heart disease of native coronary artery without angina pectoris: Secondary | ICD-10-CM | POA: Diagnosis present

## 2015-10-18 DIAGNOSIS — Z7901 Long term (current) use of anticoagulants: Secondary | ICD-10-CM | POA: Diagnosis not present

## 2015-10-18 DIAGNOSIS — I69320 Aphasia following cerebral infarction: Secondary | ICD-10-CM | POA: Diagnosis not present

## 2015-10-18 DIAGNOSIS — Z87891 Personal history of nicotine dependence: Secondary | ICD-10-CM | POA: Diagnosis not present

## 2015-10-18 DIAGNOSIS — E785 Hyperlipidemia, unspecified: Secondary | ICD-10-CM | POA: Diagnosis present

## 2015-10-18 DIAGNOSIS — E119 Type 2 diabetes mellitus without complications: Secondary | ICD-10-CM | POA: Diagnosis present

## 2015-10-18 DIAGNOSIS — L89890 Pressure ulcer of other site, unstageable: Secondary | ICD-10-CM | POA: Diagnosis not present

## 2015-10-18 DIAGNOSIS — Z79899 Other long term (current) drug therapy: Secondary | ICD-10-CM | POA: Diagnosis not present

## 2015-10-18 DIAGNOSIS — E1165 Type 2 diabetes mellitus with hyperglycemia: Secondary | ICD-10-CM | POA: Diagnosis not present

## 2015-10-23 DIAGNOSIS — I69391 Dysphagia following cerebral infarction: Secondary | ICD-10-CM | POA: Diagnosis not present

## 2015-10-23 DIAGNOSIS — I110 Hypertensive heart disease with heart failure: Secondary | ICD-10-CM | POA: Diagnosis not present

## 2015-10-23 DIAGNOSIS — E119 Type 2 diabetes mellitus without complications: Secondary | ICD-10-CM | POA: Diagnosis not present

## 2015-10-23 DIAGNOSIS — L89890 Pressure ulcer of other site, unstageable: Secondary | ICD-10-CM | POA: Diagnosis not present

## 2015-10-23 DIAGNOSIS — L89892 Pressure ulcer of other site, stage 2: Secondary | ICD-10-CM | POA: Diagnosis not present

## 2015-10-23 DIAGNOSIS — I69320 Aphasia following cerebral infarction: Secondary | ICD-10-CM | POA: Diagnosis not present

## 2015-10-24 DIAGNOSIS — I110 Hypertensive heart disease with heart failure: Secondary | ICD-10-CM | POA: Diagnosis not present

## 2015-10-24 DIAGNOSIS — E119 Type 2 diabetes mellitus without complications: Secondary | ICD-10-CM | POA: Diagnosis not present

## 2015-10-24 DIAGNOSIS — L89890 Pressure ulcer of other site, unstageable: Secondary | ICD-10-CM | POA: Diagnosis not present

## 2015-10-24 DIAGNOSIS — L89892 Pressure ulcer of other site, stage 2: Secondary | ICD-10-CM | POA: Diagnosis not present

## 2015-10-24 DIAGNOSIS — I69391 Dysphagia following cerebral infarction: Secondary | ICD-10-CM | POA: Diagnosis not present

## 2015-10-24 DIAGNOSIS — I69320 Aphasia following cerebral infarction: Secondary | ICD-10-CM | POA: Diagnosis not present

## 2015-10-25 DIAGNOSIS — E119 Type 2 diabetes mellitus without complications: Secondary | ICD-10-CM | POA: Diagnosis not present

## 2015-10-25 DIAGNOSIS — L89892 Pressure ulcer of other site, stage 2: Secondary | ICD-10-CM | POA: Diagnosis not present

## 2015-10-25 DIAGNOSIS — I69320 Aphasia following cerebral infarction: Secondary | ICD-10-CM | POA: Diagnosis not present

## 2015-10-25 DIAGNOSIS — L89890 Pressure ulcer of other site, unstageable: Secondary | ICD-10-CM | POA: Diagnosis not present

## 2015-10-25 DIAGNOSIS — I110 Hypertensive heart disease with heart failure: Secondary | ICD-10-CM | POA: Diagnosis not present

## 2015-10-25 DIAGNOSIS — I69391 Dysphagia following cerebral infarction: Secondary | ICD-10-CM | POA: Diagnosis not present

## 2015-10-26 DIAGNOSIS — I69391 Dysphagia following cerebral infarction: Secondary | ICD-10-CM | POA: Diagnosis not present

## 2015-10-26 DIAGNOSIS — I69320 Aphasia following cerebral infarction: Secondary | ICD-10-CM | POA: Diagnosis not present

## 2015-10-26 DIAGNOSIS — I110 Hypertensive heart disease with heart failure: Secondary | ICD-10-CM | POA: Diagnosis not present

## 2015-10-26 DIAGNOSIS — L89890 Pressure ulcer of other site, unstageable: Secondary | ICD-10-CM | POA: Diagnosis not present

## 2015-10-26 DIAGNOSIS — E119 Type 2 diabetes mellitus without complications: Secondary | ICD-10-CM | POA: Diagnosis not present

## 2015-10-26 DIAGNOSIS — L89892 Pressure ulcer of other site, stage 2: Secondary | ICD-10-CM | POA: Diagnosis not present

## 2015-10-29 DIAGNOSIS — E119 Type 2 diabetes mellitus without complications: Secondary | ICD-10-CM | POA: Diagnosis not present

## 2015-10-29 DIAGNOSIS — I110 Hypertensive heart disease with heart failure: Secondary | ICD-10-CM | POA: Diagnosis not present

## 2015-10-29 DIAGNOSIS — I69391 Dysphagia following cerebral infarction: Secondary | ICD-10-CM | POA: Diagnosis not present

## 2015-10-29 DIAGNOSIS — L89890 Pressure ulcer of other site, unstageable: Secondary | ICD-10-CM | POA: Diagnosis not present

## 2015-10-29 DIAGNOSIS — L89892 Pressure ulcer of other site, stage 2: Secondary | ICD-10-CM | POA: Diagnosis not present

## 2015-10-29 DIAGNOSIS — I69320 Aphasia following cerebral infarction: Secondary | ICD-10-CM | POA: Diagnosis not present

## 2015-10-31 DIAGNOSIS — L89892 Pressure ulcer of other site, stage 2: Secondary | ICD-10-CM | POA: Diagnosis not present

## 2015-10-31 DIAGNOSIS — I69320 Aphasia following cerebral infarction: Secondary | ICD-10-CM | POA: Diagnosis not present

## 2015-10-31 DIAGNOSIS — L89890 Pressure ulcer of other site, unstageable: Secondary | ICD-10-CM | POA: Diagnosis not present

## 2015-10-31 DIAGNOSIS — I110 Hypertensive heart disease with heart failure: Secondary | ICD-10-CM | POA: Diagnosis not present

## 2015-10-31 DIAGNOSIS — E119 Type 2 diabetes mellitus without complications: Secondary | ICD-10-CM | POA: Diagnosis not present

## 2015-10-31 DIAGNOSIS — I69391 Dysphagia following cerebral infarction: Secondary | ICD-10-CM | POA: Diagnosis not present

## 2015-11-01 DIAGNOSIS — L89892 Pressure ulcer of other site, stage 2: Secondary | ICD-10-CM | POA: Diagnosis not present

## 2015-11-01 DIAGNOSIS — I69320 Aphasia following cerebral infarction: Secondary | ICD-10-CM | POA: Diagnosis not present

## 2015-11-01 DIAGNOSIS — E119 Type 2 diabetes mellitus without complications: Secondary | ICD-10-CM | POA: Diagnosis not present

## 2015-11-01 DIAGNOSIS — L89890 Pressure ulcer of other site, unstageable: Secondary | ICD-10-CM | POA: Diagnosis not present

## 2015-11-01 DIAGNOSIS — I69391 Dysphagia following cerebral infarction: Secondary | ICD-10-CM | POA: Diagnosis not present

## 2015-11-01 DIAGNOSIS — I110 Hypertensive heart disease with heart failure: Secondary | ICD-10-CM | POA: Diagnosis not present

## 2015-12-03 ENCOUNTER — Other Ambulatory Visit: Payer: Self-pay | Admitting: Internal Medicine

## 2015-12-20 ENCOUNTER — Other Ambulatory Visit: Payer: Self-pay | Admitting: Adult Health

## 2016-01-11 ENCOUNTER — Other Ambulatory Visit: Payer: Self-pay | Admitting: Internal Medicine

## 2016-01-21 DIAGNOSIS — R1084 Generalized abdominal pain: Secondary | ICD-10-CM | POA: Diagnosis not present

## 2016-01-21 DIAGNOSIS — N390 Urinary tract infection, site not specified: Secondary | ICD-10-CM | POA: Diagnosis not present

## 2016-01-21 DIAGNOSIS — B999 Unspecified infectious disease: Secondary | ICD-10-CM | POA: Diagnosis not present

## 2016-01-21 DIAGNOSIS — R319 Hematuria, unspecified: Secondary | ICD-10-CM | POA: Diagnosis not present

## 2016-01-21 DIAGNOSIS — N3001 Acute cystitis with hematuria: Secondary | ICD-10-CM | POA: Diagnosis not present

## 2016-01-21 DIAGNOSIS — R109 Unspecified abdominal pain: Secondary | ICD-10-CM | POA: Diagnosis not present

## 2016-03-25 ENCOUNTER — Other Ambulatory Visit: Payer: Self-pay | Admitting: Internal Medicine

## 2016-05-05 ENCOUNTER — Other Ambulatory Visit: Payer: Self-pay | Admitting: Internal Medicine

## 2016-05-06 ENCOUNTER — Other Ambulatory Visit: Payer: Self-pay | Admitting: Internal Medicine

## 2016-05-06 DIAGNOSIS — R569 Unspecified convulsions: Secondary | ICD-10-CM | POA: Diagnosis not present

## 2016-05-07 DIAGNOSIS — R569 Unspecified convulsions: Secondary | ICD-10-CM | POA: Diagnosis not present

## 2016-05-07 DIAGNOSIS — R531 Weakness: Secondary | ICD-10-CM | POA: Diagnosis not present

## 2016-06-30 ENCOUNTER — Other Ambulatory Visit: Payer: Self-pay | Admitting: Internal Medicine

## 2016-09-16 DIAGNOSIS — Z794 Long term (current) use of insulin: Secondary | ICD-10-CM | POA: Diagnosis not present

## 2016-09-16 DIAGNOSIS — I10 Essential (primary) hypertension: Secondary | ICD-10-CM | POA: Diagnosis not present

## 2016-09-16 DIAGNOSIS — I252 Old myocardial infarction: Secondary | ICD-10-CM | POA: Diagnosis not present

## 2016-09-16 DIAGNOSIS — F329 Major depressive disorder, single episode, unspecified: Secondary | ICD-10-CM | POA: Diagnosis not present

## 2016-09-16 DIAGNOSIS — I2510 Atherosclerotic heart disease of native coronary artery without angina pectoris: Secondary | ICD-10-CM | POA: Diagnosis not present

## 2016-09-16 DIAGNOSIS — Z87891 Personal history of nicotine dependence: Secondary | ICD-10-CM | POA: Diagnosis not present

## 2016-09-16 DIAGNOSIS — Z8249 Family history of ischemic heart disease and other diseases of the circulatory system: Secondary | ICD-10-CM | POA: Diagnosis not present

## 2016-09-16 DIAGNOSIS — F419 Anxiety disorder, unspecified: Secondary | ICD-10-CM | POA: Diagnosis not present

## 2016-09-16 DIAGNOSIS — Z8673 Personal history of transient ischemic attack (TIA), and cerebral infarction without residual deficits: Secondary | ICD-10-CM | POA: Diagnosis not present

## 2016-09-16 DIAGNOSIS — L89892 Pressure ulcer of other site, stage 2: Secondary | ICD-10-CM | POA: Diagnosis not present

## 2016-09-16 DIAGNOSIS — E119 Type 2 diabetes mellitus without complications: Secondary | ICD-10-CM | POA: Diagnosis not present

## 2016-09-16 DIAGNOSIS — L89312 Pressure ulcer of right buttock, stage 2: Secondary | ICD-10-CM | POA: Diagnosis not present

## 2016-09-16 DIAGNOSIS — E785 Hyperlipidemia, unspecified: Secondary | ICD-10-CM | POA: Diagnosis not present

## 2016-09-16 DIAGNOSIS — L89610 Pressure ulcer of right heel, unstageable: Secondary | ICD-10-CM | POA: Diagnosis not present

## 2016-09-16 DIAGNOSIS — L89152 Pressure ulcer of sacral region, stage 2: Secondary | ICD-10-CM | POA: Diagnosis not present

## 2016-09-16 DIAGNOSIS — Z7901 Long term (current) use of anticoagulants: Secondary | ICD-10-CM | POA: Diagnosis not present

## 2016-09-16 DIAGNOSIS — Z7982 Long term (current) use of aspirin: Secondary | ICD-10-CM | POA: Diagnosis not present

## 2016-09-25 DIAGNOSIS — Z8673 Personal history of transient ischemic attack (TIA), and cerebral infarction without residual deficits: Secondary | ICD-10-CM | POA: Diagnosis not present

## 2016-09-25 DIAGNOSIS — I10 Essential (primary) hypertension: Secondary | ICD-10-CM | POA: Diagnosis not present

## 2016-09-25 DIAGNOSIS — M1990 Unspecified osteoarthritis, unspecified site: Secondary | ICD-10-CM | POA: Diagnosis not present

## 2016-09-25 DIAGNOSIS — Z8249 Family history of ischemic heart disease and other diseases of the circulatory system: Secondary | ICD-10-CM | POA: Diagnosis not present

## 2016-09-25 DIAGNOSIS — I2510 Atherosclerotic heart disease of native coronary artery without angina pectoris: Secondary | ICD-10-CM | POA: Diagnosis not present

## 2016-09-25 DIAGNOSIS — T83498A Other mechanical complication of other prosthetic devices, implants and grafts of genital tract, initial encounter: Secondary | ICD-10-CM | POA: Diagnosis not present

## 2016-09-25 DIAGNOSIS — I252 Old myocardial infarction: Secondary | ICD-10-CM | POA: Diagnosis not present

## 2016-09-25 DIAGNOSIS — E785 Hyperlipidemia, unspecified: Secondary | ICD-10-CM | POA: Diagnosis not present

## 2016-09-25 DIAGNOSIS — K9423 Gastrostomy malfunction: Secondary | ICD-10-CM | POA: Diagnosis not present

## 2016-09-25 DIAGNOSIS — K9420 Gastrostomy complication, unspecified: Secondary | ICD-10-CM | POA: Diagnosis not present

## 2016-09-25 DIAGNOSIS — T859XXA Unspecified complication of internal prosthetic device, implant and graft, initial encounter: Secondary | ICD-10-CM | POA: Diagnosis not present

## 2016-09-25 DIAGNOSIS — K219 Gastro-esophageal reflux disease without esophagitis: Secondary | ICD-10-CM | POA: Diagnosis not present

## 2016-09-25 DIAGNOSIS — Z87891 Personal history of nicotine dependence: Secondary | ICD-10-CM | POA: Diagnosis not present

## 2016-09-25 DIAGNOSIS — R633 Feeding difficulties: Secondary | ICD-10-CM | POA: Diagnosis not present

## 2016-09-25 DIAGNOSIS — L89152 Pressure ulcer of sacral region, stage 2: Secondary | ICD-10-CM | POA: Diagnosis not present

## 2016-09-25 DIAGNOSIS — R100 Acute abdomen: Secondary | ICD-10-CM | POA: Diagnosis not present

## 2016-09-25 DIAGNOSIS — E119 Type 2 diabetes mellitus without complications: Secondary | ICD-10-CM | POA: Diagnosis not present

## 2016-10-04 DIAGNOSIS — I10 Essential (primary) hypertension: Secondary | ICD-10-CM | POA: Diagnosis not present

## 2016-10-04 DIAGNOSIS — E119 Type 2 diabetes mellitus without complications: Secondary | ICD-10-CM | POA: Diagnosis not present

## 2016-10-04 DIAGNOSIS — L8990 Pressure ulcer of unspecified site, unspecified stage: Secondary | ICD-10-CM | POA: Diagnosis not present

## 2016-10-04 DIAGNOSIS — I5022 Chronic systolic (congestive) heart failure: Secondary | ICD-10-CM | POA: Diagnosis not present

## 2016-11-10 DIAGNOSIS — E119 Type 2 diabetes mellitus without complications: Secondary | ICD-10-CM | POA: Diagnosis not present

## 2016-11-10 DIAGNOSIS — L8990 Pressure ulcer of unspecified site, unspecified stage: Secondary | ICD-10-CM | POA: Diagnosis not present

## 2016-11-10 DIAGNOSIS — I10 Essential (primary) hypertension: Secondary | ICD-10-CM | POA: Diagnosis not present

## 2016-11-10 DIAGNOSIS — Z7901 Long term (current) use of anticoagulants: Secondary | ICD-10-CM | POA: Diagnosis not present

## 2016-11-10 DIAGNOSIS — I5022 Chronic systolic (congestive) heart failure: Secondary | ICD-10-CM | POA: Diagnosis not present

## 2016-11-12 DIAGNOSIS — E119 Type 2 diabetes mellitus without complications: Secondary | ICD-10-CM | POA: Diagnosis not present

## 2016-11-12 DIAGNOSIS — R569 Unspecified convulsions: Secondary | ICD-10-CM | POA: Diagnosis not present

## 2016-11-12 DIAGNOSIS — Z7401 Bed confinement status: Secondary | ICD-10-CM | POA: Diagnosis not present

## 2016-11-12 DIAGNOSIS — F329 Major depressive disorder, single episode, unspecified: Secondary | ICD-10-CM | POA: Diagnosis not present

## 2016-11-12 DIAGNOSIS — L89892 Pressure ulcer of other site, stage 2: Secondary | ICD-10-CM | POA: Diagnosis not present

## 2016-11-12 DIAGNOSIS — I5022 Chronic systolic (congestive) heart failure: Secondary | ICD-10-CM | POA: Diagnosis not present

## 2016-11-12 DIAGNOSIS — Z7901 Long term (current) use of anticoagulants: Secondary | ICD-10-CM | POA: Diagnosis not present

## 2016-11-12 DIAGNOSIS — I69351 Hemiplegia and hemiparesis following cerebral infarction affecting right dominant side: Secondary | ICD-10-CM | POA: Diagnosis not present

## 2016-11-12 DIAGNOSIS — I110 Hypertensive heart disease with heart failure: Secondary | ICD-10-CM | POA: Diagnosis not present

## 2016-11-12 DIAGNOSIS — Z794 Long term (current) use of insulin: Secondary | ICD-10-CM | POA: Diagnosis not present

## 2016-11-12 DIAGNOSIS — Z931 Gastrostomy status: Secondary | ICD-10-CM | POA: Diagnosis not present

## 2016-11-18 DIAGNOSIS — F329 Major depressive disorder, single episode, unspecified: Secondary | ICD-10-CM | POA: Diagnosis not present

## 2016-11-18 DIAGNOSIS — L89892 Pressure ulcer of other site, stage 2: Secondary | ICD-10-CM | POA: Diagnosis not present

## 2016-11-18 DIAGNOSIS — I110 Hypertensive heart disease with heart failure: Secondary | ICD-10-CM | POA: Diagnosis not present

## 2016-11-18 DIAGNOSIS — E119 Type 2 diabetes mellitus without complications: Secondary | ICD-10-CM | POA: Diagnosis not present

## 2016-11-18 DIAGNOSIS — I69351 Hemiplegia and hemiparesis following cerebral infarction affecting right dominant side: Secondary | ICD-10-CM | POA: Diagnosis not present

## 2016-11-18 DIAGNOSIS — I5022 Chronic systolic (congestive) heart failure: Secondary | ICD-10-CM | POA: Diagnosis not present

## 2016-11-19 DIAGNOSIS — I5022 Chronic systolic (congestive) heart failure: Secondary | ICD-10-CM | POA: Diagnosis not present

## 2016-11-19 DIAGNOSIS — I69351 Hemiplegia and hemiparesis following cerebral infarction affecting right dominant side: Secondary | ICD-10-CM | POA: Diagnosis not present

## 2016-11-19 DIAGNOSIS — F329 Major depressive disorder, single episode, unspecified: Secondary | ICD-10-CM | POA: Diagnosis not present

## 2016-11-19 DIAGNOSIS — I110 Hypertensive heart disease with heart failure: Secondary | ICD-10-CM | POA: Diagnosis not present

## 2016-11-19 DIAGNOSIS — E119 Type 2 diabetes mellitus without complications: Secondary | ICD-10-CM | POA: Diagnosis not present

## 2016-11-19 DIAGNOSIS — L89892 Pressure ulcer of other site, stage 2: Secondary | ICD-10-CM | POA: Diagnosis not present

## 2016-11-20 DIAGNOSIS — E119 Type 2 diabetes mellitus without complications: Secondary | ICD-10-CM | POA: Diagnosis not present

## 2016-11-20 DIAGNOSIS — I5022 Chronic systolic (congestive) heart failure: Secondary | ICD-10-CM | POA: Diagnosis not present

## 2016-11-20 DIAGNOSIS — F329 Major depressive disorder, single episode, unspecified: Secondary | ICD-10-CM | POA: Diagnosis not present

## 2016-11-20 DIAGNOSIS — I110 Hypertensive heart disease with heart failure: Secondary | ICD-10-CM | POA: Diagnosis not present

## 2016-11-20 DIAGNOSIS — L89892 Pressure ulcer of other site, stage 2: Secondary | ICD-10-CM | POA: Diagnosis not present

## 2016-11-20 DIAGNOSIS — I69351 Hemiplegia and hemiparesis following cerebral infarction affecting right dominant side: Secondary | ICD-10-CM | POA: Diagnosis not present

## 2016-11-25 DIAGNOSIS — I5022 Chronic systolic (congestive) heart failure: Secondary | ICD-10-CM | POA: Diagnosis not present

## 2016-11-25 DIAGNOSIS — I69351 Hemiplegia and hemiparesis following cerebral infarction affecting right dominant side: Secondary | ICD-10-CM | POA: Diagnosis not present

## 2016-11-25 DIAGNOSIS — F329 Major depressive disorder, single episode, unspecified: Secondary | ICD-10-CM | POA: Diagnosis not present

## 2016-11-25 DIAGNOSIS — L89892 Pressure ulcer of other site, stage 2: Secondary | ICD-10-CM | POA: Diagnosis not present

## 2016-11-25 DIAGNOSIS — I110 Hypertensive heart disease with heart failure: Secondary | ICD-10-CM | POA: Diagnosis not present

## 2016-11-25 DIAGNOSIS — E119 Type 2 diabetes mellitus without complications: Secondary | ICD-10-CM | POA: Diagnosis not present

## 2016-11-27 DIAGNOSIS — I5022 Chronic systolic (congestive) heart failure: Secondary | ICD-10-CM | POA: Diagnosis not present

## 2016-11-27 DIAGNOSIS — L89892 Pressure ulcer of other site, stage 2: Secondary | ICD-10-CM | POA: Diagnosis not present

## 2016-11-27 DIAGNOSIS — I110 Hypertensive heart disease with heart failure: Secondary | ICD-10-CM | POA: Diagnosis not present

## 2016-11-27 DIAGNOSIS — I69351 Hemiplegia and hemiparesis following cerebral infarction affecting right dominant side: Secondary | ICD-10-CM | POA: Diagnosis not present

## 2016-11-27 DIAGNOSIS — E11649 Type 2 diabetes mellitus with hypoglycemia without coma: Secondary | ICD-10-CM | POA: Diagnosis not present

## 2016-11-27 DIAGNOSIS — E119 Type 2 diabetes mellitus without complications: Secondary | ICD-10-CM | POA: Diagnosis not present

## 2016-11-27 DIAGNOSIS — F329 Major depressive disorder, single episode, unspecified: Secondary | ICD-10-CM | POA: Diagnosis not present

## 2016-12-09 DIAGNOSIS — I10 Essential (primary) hypertension: Secondary | ICD-10-CM | POA: Diagnosis not present

## 2016-12-09 DIAGNOSIS — L8990 Pressure ulcer of unspecified site, unspecified stage: Secondary | ICD-10-CM | POA: Diagnosis not present

## 2016-12-09 DIAGNOSIS — I5022 Chronic systolic (congestive) heart failure: Secondary | ICD-10-CM | POA: Diagnosis not present

## 2016-12-09 DIAGNOSIS — E119 Type 2 diabetes mellitus without complications: Secondary | ICD-10-CM | POA: Diagnosis not present

## 2016-12-10 DIAGNOSIS — I110 Hypertensive heart disease with heart failure: Secondary | ICD-10-CM | POA: Diagnosis not present

## 2016-12-10 DIAGNOSIS — I69351 Hemiplegia and hemiparesis following cerebral infarction affecting right dominant side: Secondary | ICD-10-CM | POA: Diagnosis not present

## 2016-12-10 DIAGNOSIS — I5022 Chronic systolic (congestive) heart failure: Secondary | ICD-10-CM | POA: Diagnosis not present

## 2016-12-10 DIAGNOSIS — Z931 Gastrostomy status: Secondary | ICD-10-CM | POA: Diagnosis not present

## 2016-12-10 DIAGNOSIS — L89892 Pressure ulcer of other site, stage 2: Secondary | ICD-10-CM | POA: Diagnosis not present

## 2016-12-10 DIAGNOSIS — E119 Type 2 diabetes mellitus without complications: Secondary | ICD-10-CM | POA: Diagnosis not present

## 2016-12-10 DIAGNOSIS — Z7901 Long term (current) use of anticoagulants: Secondary | ICD-10-CM | POA: Diagnosis not present

## 2016-12-10 DIAGNOSIS — Z794 Long term (current) use of insulin: Secondary | ICD-10-CM | POA: Diagnosis not present

## 2016-12-10 DIAGNOSIS — R569 Unspecified convulsions: Secondary | ICD-10-CM | POA: Diagnosis not present

## 2016-12-10 DIAGNOSIS — F329 Major depressive disorder, single episode, unspecified: Secondary | ICD-10-CM | POA: Diagnosis not present

## 2016-12-10 DIAGNOSIS — Z7401 Bed confinement status: Secondary | ICD-10-CM | POA: Diagnosis not present

## 2016-12-31 DIAGNOSIS — E7800 Pure hypercholesterolemia, unspecified: Secondary | ICD-10-CM | POA: Diagnosis not present

## 2016-12-31 DIAGNOSIS — Z7982 Long term (current) use of aspirin: Secondary | ICD-10-CM | POA: Diagnosis not present

## 2016-12-31 DIAGNOSIS — M86021 Acute hematogenous osteomyelitis, right humerus: Secondary | ICD-10-CM | POA: Diagnosis not present

## 2016-12-31 DIAGNOSIS — M8600 Acute hematogenous osteomyelitis, unspecified site: Secondary | ICD-10-CM | POA: Diagnosis not present

## 2016-12-31 DIAGNOSIS — R569 Unspecified convulsions: Secondary | ICD-10-CM | POA: Diagnosis not present

## 2016-12-31 DIAGNOSIS — I252 Old myocardial infarction: Secondary | ICD-10-CM | POA: Diagnosis not present

## 2016-12-31 DIAGNOSIS — Z8673 Personal history of transient ischemic attack (TIA), and cerebral infarction without residual deficits: Secondary | ICD-10-CM | POA: Diagnosis not present

## 2016-12-31 DIAGNOSIS — F329 Major depressive disorder, single episode, unspecified: Secondary | ICD-10-CM | POA: Diagnosis present

## 2016-12-31 DIAGNOSIS — G8191 Hemiplegia, unspecified affecting right dominant side: Secondary | ICD-10-CM | POA: Diagnosis not present

## 2016-12-31 DIAGNOSIS — I5023 Acute on chronic systolic (congestive) heart failure: Secondary | ICD-10-CM | POA: Diagnosis not present

## 2016-12-31 DIAGNOSIS — Z452 Encounter for adjustment and management of vascular access device: Secondary | ICD-10-CM | POA: Diagnosis not present

## 2016-12-31 DIAGNOSIS — R7881 Bacteremia: Secondary | ICD-10-CM | POA: Diagnosis not present

## 2016-12-31 DIAGNOSIS — B964 Proteus (mirabilis) (morganii) as the cause of diseases classified elsewhere: Secondary | ICD-10-CM | POA: Diagnosis present

## 2016-12-31 DIAGNOSIS — G8220 Paraplegia, unspecified: Secondary | ICD-10-CM | POA: Diagnosis not present

## 2016-12-31 DIAGNOSIS — I110 Hypertensive heart disease with heart failure: Secondary | ICD-10-CM | POA: Diagnosis present

## 2016-12-31 DIAGNOSIS — I2510 Atherosclerotic heart disease of native coronary artery without angina pectoris: Secondary | ICD-10-CM | POA: Diagnosis not present

## 2016-12-31 DIAGNOSIS — L89610 Pressure ulcer of right heel, unstageable: Secondary | ICD-10-CM | POA: Diagnosis present

## 2016-12-31 DIAGNOSIS — E1169 Type 2 diabetes mellitus with other specified complication: Secondary | ICD-10-CM | POA: Diagnosis not present

## 2016-12-31 DIAGNOSIS — B957 Other staphylococcus as the cause of diseases classified elsewhere: Secondary | ICD-10-CM | POA: Diagnosis present

## 2016-12-31 DIAGNOSIS — I69351 Hemiplegia and hemiparesis following cerebral infarction affecting right dominant side: Secondary | ICD-10-CM | POA: Diagnosis not present

## 2016-12-31 DIAGNOSIS — E785 Hyperlipidemia, unspecified: Secondary | ICD-10-CM | POA: Diagnosis present

## 2016-12-31 DIAGNOSIS — Z86718 Personal history of other venous thrombosis and embolism: Secondary | ICD-10-CM | POA: Diagnosis not present

## 2016-12-31 DIAGNOSIS — I69320 Aphasia following cerebral infarction: Secondary | ICD-10-CM | POA: Diagnosis not present

## 2016-12-31 DIAGNOSIS — M869 Osteomyelitis, unspecified: Secondary | ICD-10-CM | POA: Diagnosis not present

## 2016-12-31 DIAGNOSIS — Z1611 Resistance to penicillins: Secondary | ICD-10-CM | POA: Diagnosis present

## 2016-12-31 DIAGNOSIS — E11621 Type 2 diabetes mellitus with foot ulcer: Secondary | ICD-10-CM | POA: Diagnosis not present

## 2016-12-31 DIAGNOSIS — I5022 Chronic systolic (congestive) heart failure: Secondary | ICD-10-CM | POA: Diagnosis not present

## 2016-12-31 DIAGNOSIS — I639 Cerebral infarction, unspecified: Secondary | ICD-10-CM | POA: Diagnosis not present

## 2016-12-31 DIAGNOSIS — G40909 Epilepsy, unspecified, not intractable, without status epilepticus: Secondary | ICD-10-CM | POA: Diagnosis present

## 2016-12-31 DIAGNOSIS — S86311A Strain of muscle(s) and tendon(s) of peroneal muscle group at lower leg level, right leg, initial encounter: Secondary | ICD-10-CM | POA: Diagnosis not present

## 2016-12-31 DIAGNOSIS — A4189 Other specified sepsis: Secondary | ICD-10-CM | POA: Diagnosis not present

## 2016-12-31 DIAGNOSIS — L89614 Pressure ulcer of right heel, stage 4: Secondary | ICD-10-CM | POA: Diagnosis not present

## 2016-12-31 DIAGNOSIS — M86179 Other acute osteomyelitis, unspecified ankle and foot: Secondary | ICD-10-CM | POA: Diagnosis not present

## 2016-12-31 DIAGNOSIS — E119 Type 2 diabetes mellitus without complications: Secondary | ICD-10-CM | POA: Diagnosis not present

## 2016-12-31 DIAGNOSIS — Z87891 Personal history of nicotine dependence: Secondary | ICD-10-CM | POA: Diagnosis not present

## 2016-12-31 DIAGNOSIS — R4701 Aphasia: Secondary | ICD-10-CM | POA: Diagnosis not present

## 2016-12-31 DIAGNOSIS — Z794 Long term (current) use of insulin: Secondary | ICD-10-CM | POA: Diagnosis not present

## 2016-12-31 DIAGNOSIS — M86171 Other acute osteomyelitis, right ankle and foot: Secondary | ICD-10-CM | POA: Diagnosis present

## 2016-12-31 DIAGNOSIS — L97414 Non-pressure chronic ulcer of right heel and midfoot with necrosis of bone: Secondary | ICD-10-CM | POA: Diagnosis not present

## 2016-12-31 DIAGNOSIS — G8250 Quadriplegia, unspecified: Secondary | ICD-10-CM | POA: Diagnosis not present

## 2016-12-31 DIAGNOSIS — E1100 Type 2 diabetes mellitus with hyperosmolarity without nonketotic hyperglycemic-hyperosmolar coma (NKHHC): Secondary | ICD-10-CM | POA: Diagnosis not present

## 2016-12-31 DIAGNOSIS — R109 Unspecified abdominal pain: Secondary | ICD-10-CM | POA: Diagnosis not present

## 2016-12-31 DIAGNOSIS — M868X7 Other osteomyelitis, ankle and foot: Secondary | ICD-10-CM | POA: Diagnosis not present

## 2016-12-31 DIAGNOSIS — I6789 Other cerebrovascular disease: Secondary | ICD-10-CM | POA: Diagnosis not present

## 2016-12-31 DIAGNOSIS — E1142 Type 2 diabetes mellitus with diabetic polyneuropathy: Secondary | ICD-10-CM | POA: Diagnosis present

## 2016-12-31 DIAGNOSIS — R531 Weakness: Secondary | ICD-10-CM | POA: Diagnosis not present

## 2016-12-31 DIAGNOSIS — Z951 Presence of aortocoronary bypass graft: Secondary | ICD-10-CM | POA: Diagnosis not present

## 2016-12-31 DIAGNOSIS — Z7901 Long term (current) use of anticoagulants: Secondary | ICD-10-CM | POA: Diagnosis not present

## 2016-12-31 DIAGNOSIS — S91301A Unspecified open wound, right foot, initial encounter: Secondary | ICD-10-CM | POA: Diagnosis not present

## 2016-12-31 DIAGNOSIS — J189 Pneumonia, unspecified organism: Secondary | ICD-10-CM | POA: Diagnosis not present

## 2017-01-09 DIAGNOSIS — M8618 Other acute osteomyelitis, other site: Secondary | ICD-10-CM | POA: Diagnosis not present

## 2017-01-09 DIAGNOSIS — R1312 Dysphagia, oropharyngeal phase: Secondary | ICD-10-CM | POA: Diagnosis not present

## 2017-01-09 DIAGNOSIS — G8220 Paraplegia, unspecified: Secondary | ICD-10-CM | POA: Diagnosis present

## 2017-01-09 DIAGNOSIS — M6249 Contracture of muscle, multiple sites: Secondary | ICD-10-CM | POA: Diagnosis not present

## 2017-01-09 DIAGNOSIS — M2450 Contracture, unspecified joint: Secondary | ICD-10-CM | POA: Diagnosis not present

## 2017-01-09 DIAGNOSIS — G8190 Hemiplegia, unspecified affecting unspecified side: Secondary | ICD-10-CM | POA: Diagnosis not present

## 2017-01-09 DIAGNOSIS — I110 Hypertensive heart disease with heart failure: Secondary | ICD-10-CM | POA: Diagnosis present

## 2017-01-09 DIAGNOSIS — I69351 Hemiplegia and hemiparesis following cerebral infarction affecting right dominant side: Secondary | ICD-10-CM | POA: Diagnosis not present

## 2017-01-09 DIAGNOSIS — I69320 Aphasia following cerebral infarction: Secondary | ICD-10-CM | POA: Diagnosis not present

## 2017-01-09 DIAGNOSIS — I5023 Acute on chronic systolic (congestive) heart failure: Secondary | ICD-10-CM | POA: Diagnosis not present

## 2017-01-09 DIAGNOSIS — H5440 Blindness, one eye, unspecified eye: Secondary | ICD-10-CM | POA: Diagnosis not present

## 2017-01-09 DIAGNOSIS — I10 Essential (primary) hypertension: Secondary | ICD-10-CM | POA: Diagnosis not present

## 2017-01-09 DIAGNOSIS — I639 Cerebral infarction, unspecified: Secondary | ICD-10-CM | POA: Diagnosis not present

## 2017-01-09 DIAGNOSIS — Z794 Long term (current) use of insulin: Secondary | ICD-10-CM | POA: Diagnosis not present

## 2017-01-09 DIAGNOSIS — M868X8 Other osteomyelitis, other site: Secondary | ICD-10-CM | POA: Diagnosis not present

## 2017-01-09 DIAGNOSIS — R4189 Other symptoms and signs involving cognitive functions and awareness: Secondary | ICD-10-CM | POA: Diagnosis not present

## 2017-01-09 DIAGNOSIS — I2510 Atherosclerotic heart disease of native coronary artery without angina pectoris: Secondary | ICD-10-CM | POA: Diagnosis present

## 2017-01-09 DIAGNOSIS — R2689 Other abnormalities of gait and mobility: Secondary | ICD-10-CM | POA: Diagnosis not present

## 2017-01-09 DIAGNOSIS — I69391 Dysphagia following cerebral infarction: Secondary | ICD-10-CM | POA: Diagnosis not present

## 2017-01-09 DIAGNOSIS — R293 Abnormal posture: Secondary | ICD-10-CM | POA: Diagnosis not present

## 2017-01-09 DIAGNOSIS — R1319 Other dysphagia: Secondary | ICD-10-CM | POA: Diagnosis present

## 2017-01-09 DIAGNOSIS — I69251 Hemiplegia and hemiparesis following other nontraumatic intracranial hemorrhage affecting right dominant side: Secondary | ICD-10-CM | POA: Diagnosis not present

## 2017-01-09 DIAGNOSIS — Z431 Encounter for attention to gastrostomy: Secondary | ICD-10-CM | POA: Diagnosis not present

## 2017-01-09 DIAGNOSIS — Z8673 Personal history of transient ischemic attack (TIA), and cerebral infarction without residual deficits: Secondary | ICD-10-CM | POA: Diagnosis not present

## 2017-01-09 DIAGNOSIS — E1151 Type 2 diabetes mellitus with diabetic peripheral angiopathy without gangrene: Secondary | ICD-10-CM | POA: Diagnosis not present

## 2017-01-09 DIAGNOSIS — L89614 Pressure ulcer of right heel, stage 4: Secondary | ICD-10-CM | POA: Diagnosis not present

## 2017-01-09 DIAGNOSIS — I5022 Chronic systolic (congestive) heart failure: Secondary | ICD-10-CM | POA: Diagnosis present

## 2017-01-09 DIAGNOSIS — E119 Type 2 diabetes mellitus without complications: Secondary | ICD-10-CM | POA: Diagnosis not present

## 2017-01-09 DIAGNOSIS — B958 Unspecified staphylococcus as the cause of diseases classified elsewhere: Secondary | ICD-10-CM | POA: Diagnosis not present

## 2017-01-09 DIAGNOSIS — E1169 Type 2 diabetes mellitus with other specified complication: Secondary | ICD-10-CM | POA: Diagnosis not present

## 2017-01-09 DIAGNOSIS — S81801A Unspecified open wound, right lower leg, initial encounter: Secondary | ICD-10-CM | POA: Diagnosis present

## 2017-01-09 DIAGNOSIS — L8992 Pressure ulcer of unspecified site, stage 2: Secondary | ICD-10-CM | POA: Diagnosis not present

## 2017-01-09 DIAGNOSIS — S91309A Unspecified open wound, unspecified foot, initial encounter: Secondary | ICD-10-CM | POA: Diagnosis not present

## 2017-01-09 DIAGNOSIS — M869 Osteomyelitis, unspecified: Secondary | ICD-10-CM | POA: Diagnosis not present

## 2017-01-09 DIAGNOSIS — G40909 Epilepsy, unspecified, not intractable, without status epilepticus: Secondary | ICD-10-CM | POA: Diagnosis present

## 2017-01-09 DIAGNOSIS — I69398 Other sequelae of cerebral infarction: Secondary | ICD-10-CM | POA: Diagnosis not present

## 2017-01-09 DIAGNOSIS — R7881 Bacteremia: Secondary | ICD-10-CM | POA: Diagnosis present

## 2017-01-09 DIAGNOSIS — L89610 Pressure ulcer of right heel, unstageable: Secondary | ICD-10-CM | POA: Diagnosis not present

## 2017-01-09 DIAGNOSIS — G894 Chronic pain syndrome: Secondary | ICD-10-CM | POA: Diagnosis present

## 2017-01-09 DIAGNOSIS — R4701 Aphasia: Secondary | ICD-10-CM | POA: Diagnosis not present

## 2017-01-09 DIAGNOSIS — M6281 Muscle weakness (generalized): Secondary | ICD-10-CM | POA: Diagnosis not present

## 2017-01-09 DIAGNOSIS — R471 Dysarthria and anarthria: Secondary | ICD-10-CM | POA: Diagnosis present

## 2017-01-09 DIAGNOSIS — M86171 Other acute osteomyelitis, right ankle and foot: Secondary | ICD-10-CM | POA: Diagnosis not present

## 2017-01-09 DIAGNOSIS — Z951 Presence of aortocoronary bypass graft: Secondary | ICD-10-CM | POA: Diagnosis not present

## 2017-01-09 DIAGNOSIS — M868X7 Other osteomyelitis, ankle and foot: Secondary | ICD-10-CM | POA: Diagnosis present

## 2017-01-09 DIAGNOSIS — R5381 Other malaise: Secondary | ICD-10-CM | POA: Diagnosis present

## 2017-01-09 DIAGNOSIS — I69298 Other sequelae of other nontraumatic intracranial hemorrhage: Secondary | ICD-10-CM | POA: Diagnosis not present

## 2017-01-09 DIAGNOSIS — R569 Unspecified convulsions: Secondary | ICD-10-CM | POA: Diagnosis not present

## 2017-01-09 DIAGNOSIS — E785 Hyperlipidemia, unspecified: Secondary | ICD-10-CM | POA: Diagnosis present

## 2017-01-09 DIAGNOSIS — F329 Major depressive disorder, single episode, unspecified: Secondary | ICD-10-CM | POA: Diagnosis not present

## 2017-01-09 DIAGNOSIS — Z7982 Long term (current) use of aspirin: Secondary | ICD-10-CM | POA: Diagnosis not present

## 2017-01-09 DIAGNOSIS — I69291 Dysphagia following other nontraumatic intracranial hemorrhage: Secondary | ICD-10-CM | POA: Diagnosis not present

## 2017-01-09 DIAGNOSIS — I69220 Aphasia following other nontraumatic intracranial hemorrhage: Secondary | ICD-10-CM | POA: Diagnosis not present

## 2017-01-16 LAB — BASIC METABOLIC PANEL
BUN: 17 (ref 4–21)
Creatinine: 0.7 (ref 0.6–1.3)
POTASSIUM: 4.3 (ref 3.4–5.3)
Sodium: 144 (ref 137–147)

## 2017-01-16 LAB — CBC AND DIFFERENTIAL
HEMATOCRIT: 28 — AB (ref 41–53)
Hemoglobin: 9.4 — AB (ref 13.5–17.5)
PLATELETS: 184 (ref 150–399)
WBC: 7.4

## 2017-01-23 LAB — HEPATIC FUNCTION PANEL
ALK PHOS: 100 (ref 25–125)
ALT: 31 (ref 10–40)
AST: 40 (ref 14–40)
BILIRUBIN, TOTAL: 0.2

## 2017-01-23 LAB — BASIC METABOLIC PANEL
BUN: 23 — AB (ref 4–21)
Creatinine: 0.7 (ref 0.6–1.3)
Glucose: 121
Potassium: 4.3 (ref 3.4–5.3)
Sodium: 141 (ref 137–147)

## 2017-01-23 LAB — CBC AND DIFFERENTIAL
HEMATOCRIT: 28 — AB (ref 41–53)
Hemoglobin: 9.2 — AB (ref 13.5–17.5)
PLATELETS: 253 (ref 150–399)
WBC: 5.5

## 2017-01-26 LAB — CBC AND DIFFERENTIAL
HCT: 26 — AB (ref 41–53)
HEMOGLOBIN: 8.9 — AB (ref 13.5–17.5)
Platelets: 133 — AB (ref 150–399)
WBC: 6.2

## 2017-01-26 LAB — BASIC METABOLIC PANEL
BUN: 24 — AB (ref 4–21)
Creatinine: 0.7 (ref 0.6–1.3)
Glucose: 223
POTASSIUM: 4.4 (ref 3.4–5.3)
SODIUM: 139 (ref 137–147)

## 2017-01-26 LAB — HEPATIC FUNCTION PANEL
ALK PHOS: 93 (ref 25–125)
ALT: 37 (ref 10–40)
AST: 38 (ref 14–40)
Bilirubin, Total: 0.1

## 2017-01-28 DIAGNOSIS — Z86718 Personal history of other venous thrombosis and embolism: Secondary | ICD-10-CM | POA: Diagnosis not present

## 2017-01-28 DIAGNOSIS — Z7189 Other specified counseling: Secondary | ICD-10-CM | POA: Diagnosis not present

## 2017-01-28 DIAGNOSIS — I69920 Aphasia following unspecified cerebrovascular disease: Secondary | ICD-10-CM | POA: Diagnosis not present

## 2017-01-28 DIAGNOSIS — E11649 Type 2 diabetes mellitus with hypoglycemia without coma: Secondary | ICD-10-CM | POA: Diagnosis not present

## 2017-01-28 DIAGNOSIS — M86171 Other acute osteomyelitis, right ankle and foot: Secondary | ICD-10-CM | POA: Diagnosis present

## 2017-01-28 DIAGNOSIS — M6249 Contracture of muscle, multiple sites: Secondary | ICD-10-CM | POA: Diagnosis not present

## 2017-01-28 DIAGNOSIS — R471 Dysarthria and anarthria: Secondary | ICD-10-CM | POA: Diagnosis not present

## 2017-01-28 DIAGNOSIS — M6281 Muscle weakness (generalized): Secondary | ICD-10-CM | POA: Diagnosis not present

## 2017-01-28 DIAGNOSIS — R601 Generalized edema: Secondary | ICD-10-CM | POA: Diagnosis not present

## 2017-01-28 DIAGNOSIS — E119 Type 2 diabetes mellitus without complications: Secondary | ICD-10-CM | POA: Diagnosis not present

## 2017-01-28 DIAGNOSIS — I10 Essential (primary) hypertension: Secondary | ICD-10-CM | POA: Diagnosis not present

## 2017-01-28 DIAGNOSIS — D638 Anemia in other chronic diseases classified elsewhere: Secondary | ICD-10-CM | POA: Diagnosis present

## 2017-01-28 DIAGNOSIS — G8220 Paraplegia, unspecified: Secondary | ICD-10-CM | POA: Diagnosis present

## 2017-01-28 DIAGNOSIS — E1169 Type 2 diabetes mellitus with other specified complication: Secondary | ICD-10-CM | POA: Diagnosis present

## 2017-01-28 DIAGNOSIS — B958 Unspecified staphylococcus as the cause of diseases classified elsewhere: Secondary | ICD-10-CM | POA: Diagnosis not present

## 2017-01-28 DIAGNOSIS — M869 Osteomyelitis, unspecified: Secondary | ICD-10-CM | POA: Diagnosis not present

## 2017-01-28 DIAGNOSIS — R4182 Altered mental status, unspecified: Secondary | ICD-10-CM | POA: Diagnosis not present

## 2017-01-28 DIAGNOSIS — Z515 Encounter for palliative care: Secondary | ICD-10-CM | POA: Diagnosis present

## 2017-01-28 DIAGNOSIS — G8929 Other chronic pain: Secondary | ICD-10-CM | POA: Diagnosis not present

## 2017-01-28 DIAGNOSIS — M8618 Other acute osteomyelitis, other site: Secondary | ICD-10-CM | POA: Diagnosis not present

## 2017-01-28 DIAGNOSIS — I69320 Aphasia following cerebral infarction: Secondary | ICD-10-CM | POA: Diagnosis not present

## 2017-01-28 DIAGNOSIS — I69351 Hemiplegia and hemiparesis following cerebral infarction affecting right dominant side: Secondary | ICD-10-CM | POA: Diagnosis not present

## 2017-01-28 DIAGNOSIS — Z8673 Personal history of transient ischemic attack (TIA), and cerebral infarction without residual deficits: Secondary | ICD-10-CM | POA: Diagnosis not present

## 2017-01-28 DIAGNOSIS — I639 Cerebral infarction, unspecified: Secondary | ICD-10-CM | POA: Diagnosis not present

## 2017-01-28 DIAGNOSIS — R9431 Abnormal electrocardiogram [ECG] [EKG]: Secondary | ICD-10-CM | POA: Diagnosis not present

## 2017-01-28 DIAGNOSIS — I110 Hypertensive heart disease with heart failure: Secondary | ICD-10-CM | POA: Diagnosis present

## 2017-01-28 DIAGNOSIS — Y712 Prosthetic and other implants, materials and accessory cardiovascular devices associated with adverse incidents: Secondary | ICD-10-CM | POA: Diagnosis present

## 2017-01-28 DIAGNOSIS — R410 Disorientation, unspecified: Secondary | ICD-10-CM | POA: Diagnosis not present

## 2017-01-28 DIAGNOSIS — S91301A Unspecified open wound, right foot, initial encounter: Secondary | ICD-10-CM | POA: Diagnosis not present

## 2017-01-28 DIAGNOSIS — Z794 Long term (current) use of insulin: Secondary | ICD-10-CM | POA: Diagnosis not present

## 2017-01-28 DIAGNOSIS — Z452 Encounter for adjustment and management of vascular access device: Secondary | ICD-10-CM | POA: Diagnosis not present

## 2017-01-28 DIAGNOSIS — G9340 Encephalopathy, unspecified: Secondary | ICD-10-CM | POA: Diagnosis present

## 2017-01-28 DIAGNOSIS — R627 Adult failure to thrive: Secondary | ICD-10-CM | POA: Diagnosis present

## 2017-01-28 DIAGNOSIS — R402441 Other coma, without documented Glasgow coma scale score, or with partial score reported, in the field [EMT or ambulance]: Secondary | ICD-10-CM | POA: Diagnosis not present

## 2017-01-28 DIAGNOSIS — L89614 Pressure ulcer of right heel, stage 4: Secondary | ICD-10-CM | POA: Diagnosis not present

## 2017-01-28 DIAGNOSIS — I5022 Chronic systolic (congestive) heart failure: Secondary | ICD-10-CM | POA: Diagnosis present

## 2017-01-28 DIAGNOSIS — G894 Chronic pain syndrome: Secondary | ICD-10-CM | POA: Diagnosis not present

## 2017-01-28 DIAGNOSIS — M25571 Pain in right ankle and joints of right foot: Secondary | ICD-10-CM | POA: Diagnosis not present

## 2017-01-28 DIAGNOSIS — M868X7 Other osteomyelitis, ankle and foot: Secondary | ICD-10-CM | POA: Diagnosis not present

## 2017-01-28 DIAGNOSIS — G40909 Epilepsy, unspecified, not intractable, without status epilepticus: Secondary | ICD-10-CM | POA: Diagnosis present

## 2017-01-28 DIAGNOSIS — I071 Rheumatic tricuspid insufficiency: Secondary | ICD-10-CM | POA: Diagnosis present

## 2017-01-28 DIAGNOSIS — R4701 Aphasia: Secondary | ICD-10-CM | POA: Diagnosis not present

## 2017-01-28 DIAGNOSIS — F0150 Vascular dementia without behavioral disturbance: Secondary | ICD-10-CM | POA: Diagnosis present

## 2017-01-28 DIAGNOSIS — Z431 Encounter for attention to gastrostomy: Secondary | ICD-10-CM | POA: Diagnosis not present

## 2017-01-28 DIAGNOSIS — I69391 Dysphagia following cerebral infarction: Secondary | ICD-10-CM | POA: Diagnosis not present

## 2017-01-28 DIAGNOSIS — R569 Unspecified convulsions: Secondary | ICD-10-CM | POA: Diagnosis not present

## 2017-01-28 DIAGNOSIS — L8992 Pressure ulcer of unspecified site, stage 2: Secondary | ICD-10-CM | POA: Diagnosis not present

## 2017-01-28 DIAGNOSIS — J90 Pleural effusion, not elsewhere classified: Secondary | ICD-10-CM | POA: Diagnosis not present

## 2017-01-28 DIAGNOSIS — R293 Abnormal posture: Secondary | ICD-10-CM | POA: Diagnosis not present

## 2017-01-28 DIAGNOSIS — H5462 Unqualified visual loss, left eye, normal vision right eye: Secondary | ICD-10-CM | POA: Diagnosis present

## 2017-01-28 DIAGNOSIS — T82514A Breakdown (mechanical) of infusion catheter, initial encounter: Secondary | ICD-10-CM | POA: Diagnosis present

## 2017-01-28 DIAGNOSIS — S91309A Unspecified open wound, unspecified foot, initial encounter: Secondary | ICD-10-CM | POA: Diagnosis not present

## 2017-01-28 DIAGNOSIS — R7881 Bacteremia: Secondary | ICD-10-CM | POA: Diagnosis not present

## 2017-01-28 DIAGNOSIS — R2240 Localized swelling, mass and lump, unspecified lower limb: Secondary | ICD-10-CM | POA: Diagnosis not present

## 2017-01-28 DIAGNOSIS — B957 Other staphylococcus as the cause of diseases classified elsewhere: Secondary | ICD-10-CM | POA: Diagnosis present

## 2017-01-28 DIAGNOSIS — I69398 Other sequelae of cerebral infarction: Secondary | ICD-10-CM | POA: Diagnosis not present

## 2017-01-28 DIAGNOSIS — R1111 Vomiting without nausea: Secondary | ICD-10-CM | POA: Diagnosis not present

## 2017-01-28 DIAGNOSIS — L89894 Pressure ulcer of other site, stage 4: Secondary | ICD-10-CM | POA: Diagnosis not present

## 2017-01-28 DIAGNOSIS — R1312 Dysphagia, oropharyngeal phase: Secondary | ICD-10-CM | POA: Diagnosis not present

## 2017-01-28 DIAGNOSIS — J9811 Atelectasis: Secondary | ICD-10-CM | POA: Diagnosis present

## 2017-01-28 DIAGNOSIS — E162 Hypoglycemia, unspecified: Secondary | ICD-10-CM | POA: Diagnosis not present

## 2017-01-28 DIAGNOSIS — I2510 Atherosclerotic heart disease of native coronary artery without angina pectoris: Secondary | ICD-10-CM | POA: Diagnosis not present

## 2017-01-28 DIAGNOSIS — K567 Ileus, unspecified: Secondary | ICD-10-CM | POA: Diagnosis present

## 2017-01-28 DIAGNOSIS — E1151 Type 2 diabetes mellitus with diabetic peripheral angiopathy without gangrene: Secondary | ICD-10-CM | POA: Diagnosis not present

## 2017-01-29 ENCOUNTER — Encounter: Payer: Self-pay | Admitting: Internal Medicine

## 2017-01-29 ENCOUNTER — Non-Acute Institutional Stay (SKILLED_NURSING_FACILITY): Payer: Medicare Other | Admitting: Internal Medicine

## 2017-01-29 DIAGNOSIS — L89614 Pressure ulcer of right heel, stage 4: Secondary | ICD-10-CM

## 2017-01-29 DIAGNOSIS — R7881 Bacteremia: Secondary | ICD-10-CM

## 2017-01-29 DIAGNOSIS — Z794 Long term (current) use of insulin: Secondary | ICD-10-CM

## 2017-01-29 DIAGNOSIS — B958 Unspecified staphylococcus as the cause of diseases classified elsewhere: Secondary | ICD-10-CM

## 2017-01-29 DIAGNOSIS — B957 Other staphylococcus as the cause of diseases classified elsewhere: Secondary | ICD-10-CM | POA: Insufficient documentation

## 2017-01-29 DIAGNOSIS — Z8673 Personal history of transient ischemic attack (TIA), and cerebral infarction without residual deficits: Secondary | ICD-10-CM | POA: Diagnosis not present

## 2017-01-29 DIAGNOSIS — E119 Type 2 diabetes mellitus without complications: Secondary | ICD-10-CM | POA: Diagnosis not present

## 2017-01-29 DIAGNOSIS — R569 Unspecified convulsions: Secondary | ICD-10-CM

## 2017-01-29 NOTE — Assessment & Plan Note (Signed)
Continue vancomycin through 02/05/17, trough level goal 15-20

## 2017-01-29 NOTE — Progress Notes (Signed)
NURSING HOME LOCATION:  [facility] ROOM NUMBER:  111-A  CODE STATUS:  Full Code  PCP:  No primary care provider on file.    This is a comprehensive admission note to [facility] performed on this date less than 30 days from date of admission. Included are preadmission medical/surgical history;reconciled medication list; family history; social history and comprehensive review of systems.  Corrections and additions to the records were documented . Comprehensive physical exam was also performed. Additionally a clinical summary was entered for each active diagnosis pertinent to this admission in the Problem List to enhance continuity of care.  HPI: The patient had been discharged from [facility] 01/28/17 to [facility]. He had been admitted to [facility] 9/7, transferred from [facility] for completion of IV antibiotic for  wound care and  nutritional support.(Note: no records in Epic Care Everywhere from [facility]) He received aggressive wound care and dual therapy with Cipro and vancomycin. Diagnoses included right heel stage IV pressure ulcer, status post debridement @ [facility]. This was associated with osteomyelitis. He also had a full thickness right lateral posterior lower leg wound. Vancomycin with trough level 15-20 was to be continued through 02/05/17 for Staphylococcal bacteremia.  Past medical and surgical history: Includes hypertension, diabetes, depression, stroke with hemiplegia, seizure disorder, congestive heart failure, blindness in the left eye, and chronic anemia. Surgical history is limited to PEG tube placement & wound debridement as per records available. The patient previously was in [facility] 02/14/15-05/22/15. 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 Ejection fraction was 30-35 percent.  At that time he had right lower  extremity DVT. Stroke was thought to be cardioembolic in origin and anticoagulation was initiated. While at [facility] he remained encephalopathic. He was discharged home in the care of his daughter at her  request.  Social history:  not present in Epic  Family history: No history in Epic  Review of systems:Could not be completed due to nonverbal state   Physical exam:  Pertinent or positive findings: Pattern alopecia is present. He has a IT consultant. No definite audible speech. Vocal sounds were garbled. When asked a question he shakes his head side to side as if agitated. The left eye is closed. Arcus senilis present on the right. He has multiple missing teeth. There is heavy plaque on the remaining teeth. He has bilateral low-grade rales. S4 gallop cadence is present. Abdomen is hypertympanic. PEG present. There is minimal movement of the left upper extremity and none in the other extremities. He has contractures of the feet/toes bilaterally. Also has contractures of his hands. The right hand appears edematous and the skin is somewhat shiny. He has scattered patches of vitiligo over the right elbow, right wrist, & R knee. Has irregular splotchy hyperpigmentation over the right lower extremity. The left lower extremity is atrophic below the knee. The right ankle and foot are dressed.  General appearance:Adequately nourished; no acute distress , increased work of breathing is present.   Lymphatic: No lymphadenopathy about the head, neck, axilla . Eyes: No conjunctival inflammation or lid edema is present. There is no scleral icterus of OD. Ears:  External ear exam shows no significant lesions or deformities.   Nose:  External nasal examination shows no deformity or inflammation. Nasal mucosa are pink and moist without lesions ,exudates Oral exam: lips and gums are healthy appearing.Oropharynx not visualized. Neck:  No thyromegaly, masses, tenderness noted.    Heart:  Normal rate and regular rhythm. S1 and  S2 normal without murmur, click, rub .  Abdomen:Bowel sounds are normal. Abdomen is soft and nontender with no organomegaly, hernias,masses. GU: deferred  Extremities:  No cyanosis, clubbing,edema  Skin: Warm & dry w/o tenting. No significant rash.  See clinical summary under each active problem in the Problem List with associated updated therapeutic plan

## 2017-01-30 ENCOUNTER — Encounter: Payer: Self-pay | Admitting: Internal Medicine

## 2017-01-30 NOTE — Patient Instructions (Signed)
See assessment and plan under each diagnosis in the problem list and acutely for this visit 

## 2017-01-30 NOTE — Assessment & Plan Note (Signed)
Glucose monitor on basal insulin

## 2017-01-30 NOTE — Assessment & Plan Note (Signed)
SNF Wound Care monitor

## 2017-01-30 NOTE — Assessment & Plan Note (Signed)
Continue Keppra.

## 2017-01-30 NOTE — Assessment & Plan Note (Signed)
-   Continue Eliquis 

## 2017-02-02 DIAGNOSIS — L89894 Pressure ulcer of other site, stage 4: Secondary | ICD-10-CM | POA: Diagnosis not present

## 2017-02-02 DIAGNOSIS — L89614 Pressure ulcer of right heel, stage 4: Secondary | ICD-10-CM | POA: Diagnosis not present

## 2017-02-03 ENCOUNTER — Non-Acute Institutional Stay (SKILLED_NURSING_FACILITY): Payer: Medicare Other | Admitting: Internal Medicine

## 2017-02-03 ENCOUNTER — Encounter: Payer: Self-pay | Admitting: Internal Medicine

## 2017-02-03 DIAGNOSIS — Z86718 Personal history of other venous thrombosis and embolism: Secondary | ICD-10-CM | POA: Diagnosis not present

## 2017-02-03 DIAGNOSIS — R601 Generalized edema: Secondary | ICD-10-CM

## 2017-02-03 NOTE — Assessment & Plan Note (Signed)
-   Continue Eliquis 

## 2017-02-03 NOTE — Patient Instructions (Signed)
See assessment and plan under each diagnosis in the problem list and acutely for this visit 

## 2017-02-03 NOTE — Progress Notes (Signed)
    NURSING HOME LOCATION:  [facility] ROOM NUMBER:  105-A  CODE STATUS:  Full Code  PCP:  No primary care provider on file.   This is a nursing facility follow up for specific acute issue of edema of the extremities.  Interim medical record and care since last [facility] visit was updated with review of diagnostic studies and change in clinical status since last visit were documented.  HPI: When seen 9/27 he did have localized edema of the right upper extremity. At that time an S4 gallop cadence was suggested. Staff now reports edema of both upper extremities. Glucoses have ranged from a low of 86 up to a high of 176 indicating adequate diabetes control.  Review of systems: Communication is mainly by nods of the head. Speech is garbled and unintelligible.  Physical exam:  Pertinent or positive findings: Left eye is closed. Previously noted gallop cadence is not present today. Rhythm is regular. Low-grade homogenous expiratory rhonchi. Overall bowel sounds are decreased. He has pitting edema of the upper & lower extremities. This is greater in the right lower extremity than the left upper extremity.  General appearance:Adequately nourished; no acute distress , increased work of breathing is present despite rhonchi  Lymphatic: No lymphadenopathy about the head, neck, axilla . Eyes: No conjunctival inflammation or lid edema is present on R. There is no scleral icterus. Ears:  External ear exam shows no significant lesions or deformities.   Nose:  External nasal examination shows no deformity or inflammation. Nasal mucosa are pink and moist without lesions ,exudates Oral exam: lips and gums are healthy appearing. Neck:  No thyromegaly, masses, tenderness noted.    Heart:  Normal rate and regular rhythm. S1 and S2 normal without gallop, murmur, click, rub .  Lungs:without wheezes, rales , rubs. Abdomen:Abdomen is soft and nontender with no organomegaly, hernias,masses. GU:  deferred  Extremities:  No cyanosis, clubbing Neurologic exam : Skin: Warm & dry w/o tenting. No significant lesions or rash.  See summary under each active problem in the Problem List with associated updated therapeutic plan

## 2017-02-03 NOTE — Assessment & Plan Note (Addendum)
Trial of furosemide Check, present metabolic profile Nutritional consult to assess sodium and protein content of tube feedings

## 2017-02-04 ENCOUNTER — Encounter (HOSPITAL_COMMUNITY): Payer: Self-pay | Admitting: Neurology

## 2017-02-04 ENCOUNTER — Emergency Department (HOSPITAL_COMMUNITY): Payer: Medicare Other

## 2017-02-04 ENCOUNTER — Observation Stay (HOSPITAL_COMMUNITY): Payer: Medicare Other

## 2017-02-04 ENCOUNTER — Inpatient Hospital Stay (HOSPITAL_COMMUNITY)
Admission: EM | Admit: 2017-02-04 | Discharge: 2017-02-11 | DRG: 637 | Disposition: A | Discharge status: Home Health | Payer: Medicare Other | Attending: Family Medicine | Admitting: Family Medicine

## 2017-02-04 DIAGNOSIS — B957 Other staphylococcus as the cause of diseases classified elsewhere: Secondary | ICD-10-CM | POA: Diagnosis present

## 2017-02-04 DIAGNOSIS — I69351 Hemiplegia and hemiparesis following cerebral infarction affecting right dominant side: Secondary | ICD-10-CM

## 2017-02-04 DIAGNOSIS — G8220 Paraplegia, unspecified: Secondary | ICD-10-CM | POA: Diagnosis present

## 2017-02-04 DIAGNOSIS — Z7189 Other specified counseling: Secondary | ICD-10-CM

## 2017-02-04 DIAGNOSIS — E8809 Other disorders of plasma-protein metabolism, not elsewhere classified: Secondary | ICD-10-CM | POA: Diagnosis present

## 2017-02-04 DIAGNOSIS — Z515 Encounter for palliative care: Secondary | ICD-10-CM

## 2017-02-04 DIAGNOSIS — H5462 Unqualified visual loss, left eye, normal vision right eye: Secondary | ICD-10-CM | POA: Diagnosis present

## 2017-02-04 DIAGNOSIS — I110 Hypertensive heart disease with heart failure: Secondary | ICD-10-CM | POA: Diagnosis present

## 2017-02-04 DIAGNOSIS — R601 Generalized edema: Secondary | ICD-10-CM

## 2017-02-04 DIAGNOSIS — D638 Anemia in other chronic diseases classified elsewhere: Secondary | ICD-10-CM | POA: Diagnosis present

## 2017-02-04 DIAGNOSIS — R627 Adult failure to thrive: Secondary | ICD-10-CM | POA: Diagnosis present

## 2017-02-04 DIAGNOSIS — R410 Disorientation, unspecified: Secondary | ICD-10-CM

## 2017-02-04 DIAGNOSIS — Z79899 Other long term (current) drug therapy: Secondary | ICD-10-CM

## 2017-02-04 DIAGNOSIS — M86171 Other acute osteomyelitis, right ankle and foot: Secondary | ICD-10-CM

## 2017-02-04 DIAGNOSIS — F0150 Vascular dementia without behavioral disturbance: Secondary | ICD-10-CM | POA: Diagnosis present

## 2017-02-04 DIAGNOSIS — R569 Unspecified convulsions: Secondary | ICD-10-CM

## 2017-02-04 DIAGNOSIS — E1169 Type 2 diabetes mellitus with other specified complication: Secondary | ICD-10-CM | POA: Diagnosis present

## 2017-02-04 DIAGNOSIS — G40909 Epilepsy, unspecified, not intractable, without status epilepticus: Secondary | ICD-10-CM | POA: Diagnosis present

## 2017-02-04 DIAGNOSIS — I69920 Aphasia following unspecified cerebrovascular disease: Secondary | ICD-10-CM

## 2017-02-04 DIAGNOSIS — M25571 Pain in right ankle and joints of right foot: Secondary | ICD-10-CM

## 2017-02-04 DIAGNOSIS — T82514A Breakdown (mechanical) of infusion catheter, initial encounter: Secondary | ICD-10-CM | POA: Diagnosis present

## 2017-02-04 DIAGNOSIS — I5022 Chronic systolic (congestive) heart failure: Secondary | ICD-10-CM | POA: Diagnosis present

## 2017-02-04 DIAGNOSIS — R1110 Vomiting, unspecified: Secondary | ICD-10-CM

## 2017-02-04 DIAGNOSIS — Z931 Gastrostomy status: Secondary | ICD-10-CM

## 2017-02-04 DIAGNOSIS — Z8673 Personal history of transient ischemic attack (TIA), and cerebral infarction without residual deficits: Secondary | ICD-10-CM

## 2017-02-04 DIAGNOSIS — E11649 Type 2 diabetes mellitus with hypoglycemia without coma: Secondary | ICD-10-CM | POA: Diagnosis not present

## 2017-02-04 DIAGNOSIS — I10 Essential (primary) hypertension: Secondary | ICD-10-CM | POA: Diagnosis present

## 2017-02-04 DIAGNOSIS — Z7401 Bed confinement status: Secondary | ICD-10-CM

## 2017-02-04 DIAGNOSIS — M21372 Foot drop, left foot: Secondary | ICD-10-CM | POA: Diagnosis present

## 2017-02-04 DIAGNOSIS — K567 Ileus, unspecified: Secondary | ICD-10-CM

## 2017-02-04 DIAGNOSIS — M21371 Foot drop, right foot: Secondary | ICD-10-CM | POA: Diagnosis present

## 2017-02-04 DIAGNOSIS — Z86718 Personal history of other venous thrombosis and embolism: Secondary | ICD-10-CM

## 2017-02-04 DIAGNOSIS — R4182 Altered mental status, unspecified: Secondary | ICD-10-CM | POA: Diagnosis not present

## 2017-02-04 DIAGNOSIS — Z452 Encounter for adjustment and management of vascular access device: Secondary | ICD-10-CM | POA: Diagnosis not present

## 2017-02-04 DIAGNOSIS — E119 Type 2 diabetes mellitus without complications: Secondary | ICD-10-CM

## 2017-02-04 DIAGNOSIS — R9431 Abnormal electrocardiogram [ECG] [EKG]: Secondary | ICD-10-CM | POA: Diagnosis not present

## 2017-02-04 DIAGNOSIS — R7881 Bacteremia: Secondary | ICD-10-CM | POA: Diagnosis not present

## 2017-02-04 DIAGNOSIS — J90 Pleural effusion, not elsewhere classified: Secondary | ICD-10-CM | POA: Diagnosis not present

## 2017-02-04 DIAGNOSIS — G8929 Other chronic pain: Secondary | ICD-10-CM

## 2017-02-04 DIAGNOSIS — G9340 Encephalopathy, unspecified: Secondary | ICD-10-CM | POA: Diagnosis present

## 2017-02-04 DIAGNOSIS — F329 Major depressive disorder, single episode, unspecified: Secondary | ICD-10-CM | POA: Diagnosis present

## 2017-02-04 DIAGNOSIS — L89614 Pressure ulcer of right heel, stage 4: Secondary | ICD-10-CM | POA: Diagnosis present

## 2017-02-04 DIAGNOSIS — Z794 Long term (current) use of insulin: Secondary | ICD-10-CM

## 2017-02-04 DIAGNOSIS — S91301A Unspecified open wound, right foot, initial encounter: Secondary | ICD-10-CM | POA: Diagnosis not present

## 2017-02-04 DIAGNOSIS — I071 Rheumatic tricuspid insufficiency: Secondary | ICD-10-CM | POA: Diagnosis present

## 2017-02-04 DIAGNOSIS — E162 Hypoglycemia, unspecified: Secondary | ICD-10-CM

## 2017-02-04 DIAGNOSIS — J9811 Atelectasis: Secondary | ICD-10-CM | POA: Diagnosis present

## 2017-02-04 DIAGNOSIS — Z7901 Long term (current) use of anticoagulants: Secondary | ICD-10-CM

## 2017-02-04 DIAGNOSIS — Y712 Prosthetic and other implants, materials and accessory cardiovascular devices associated with adverse incidents: Secondary | ICD-10-CM | POA: Diagnosis present

## 2017-02-04 LAB — CBC WITH DIFFERENTIAL/PLATELET
BASOS PCT: 0 %
Basophils Absolute: 0 10*3/uL (ref 0.0–0.1)
EOS ABS: 0.5 10*3/uL (ref 0.0–0.7)
EOS PCT: 6 %
HEMATOCRIT: 28.2 % — AB (ref 39.0–52.0)
Hemoglobin: 8.8 g/dL — ABNORMAL LOW (ref 13.0–17.0)
Lymphocytes Relative: 24 %
Lymphs Abs: 1.8 10*3/uL (ref 0.7–4.0)
MCH: 29.1 pg (ref 26.0–34.0)
MCHC: 31.2 g/dL (ref 30.0–36.0)
MCV: 93.4 fL (ref 78.0–100.0)
MONO ABS: 0.6 10*3/uL (ref 0.1–1.0)
MONOS PCT: 8 %
Neutro Abs: 4.5 10*3/uL (ref 1.7–7.7)
Neutrophils Relative %: 62 %
PLATELETS: 235 10*3/uL (ref 150–400)
RBC: 3.02 MIL/uL — ABNORMAL LOW (ref 4.22–5.81)
RDW: 14.8 % (ref 11.5–15.5)
WBC: 7.4 10*3/uL (ref 4.0–10.5)

## 2017-02-04 LAB — COMPREHENSIVE METABOLIC PANEL
ALBUMIN: 2 g/dL — AB (ref 3.5–5.0)
ALT: 22 U/L (ref 17–63)
ANION GAP: 5 (ref 5–15)
AST: 33 U/L (ref 15–41)
Alkaline Phosphatase: 103 U/L (ref 38–126)
BILIRUBIN TOTAL: 0.4 mg/dL (ref 0.3–1.2)
BUN: 21 mg/dL — AB (ref 6–20)
CALCIUM: 8.2 mg/dL — AB (ref 8.9–10.3)
CHLORIDE: 107 mmol/L (ref 101–111)
CO2: 24 mmol/L (ref 22–32)
CREATININE: 0.83 mg/dL (ref 0.61–1.24)
GFR calc Af Amer: >60 mL/min (ref >60)
GFR calc non Af Amer: >60 mL/min (ref >60)
GLUCOSE: 50 mg/dL — AB (ref 65–99)
POTASSIUM: 3.7 mmol/L (ref 3.5–5.1)
SODIUM: 136 mmol/L (ref 135–145)
Total Protein: 5.7 g/dL — ABNORMAL LOW (ref 6.5–8.1)

## 2017-02-04 LAB — URINALYSIS, ROUTINE W REFLEX MICROSCOPIC
BACTERIA UA: NONE SEEN
BILIRUBIN URINE: NEGATIVE
Glucose, UA: NEGATIVE mg/dL
Hgb urine dipstick: NEGATIVE
KETONES UR: NEGATIVE mg/dL
LEUKOCYTES UA: NEGATIVE
Nitrite: NEGATIVE
PH: 7 (ref 5.0–8.0)
Protein, ur: 100 mg/dL — AB
SQUAMOUS EPITHELIAL / LPF: NONE SEEN
Specific Gravity, Urine: 1.008 (ref 1.005–1.030)

## 2017-02-04 LAB — ALBUMIN: ALBUMIN: 2 g/dL — AB (ref 3.5–5.0)

## 2017-02-04 LAB — CBG MONITORING, ED
GLUCOSE-CAPILLARY: 57 mg/dL — AB (ref 65–99)
GLUCOSE-CAPILLARY: 69 mg/dL (ref 65–99)
Glucose-Capillary: 107 mg/dL — ABNORMAL HIGH (ref 65–99)

## 2017-02-04 LAB — GLUCOSE, CAPILLARY
Glucose-Capillary: 38 mg/dL — CL (ref 65–99)
Glucose-Capillary: 74 mg/dL (ref 65–99)

## 2017-02-04 LAB — PREALBUMIN: PREALBUMIN: 23.2 mg/dL (ref 18–38)

## 2017-02-04 LAB — I-STAT CG4 LACTIC ACID, ED: Lactic Acid, Venous: 1.11 mmol/L (ref 0.5–1.9)

## 2017-02-04 LAB — HEMOGLOBIN A1C
Hgb A1c MFr Bld: 5.3 % (ref 4.8–5.6)
Mean Plasma Glucose: 105.41 mg/dL

## 2017-02-04 MED ORDER — FLUOXETINE HCL 20 MG PO CAPS
20.0000 mg | ORAL_CAPSULE | Freq: Every day | ORAL | Status: DC
  Administered 2017-02-04 – 2017-02-11 (×7): 20 mg
  Filled 2017-02-04 (×8): qty 1

## 2017-02-04 MED ORDER — ATORVASTATIN CALCIUM 80 MG PO TABS
80.0000 mg | ORAL_TABLET | Freq: Every day | ORAL | Status: DC
  Administered 2017-02-04 – 2017-02-10 (×7): 80 mg via ORAL
  Filled 2017-02-04 (×7): qty 1

## 2017-02-04 MED ORDER — ACETAMINOPHEN 325 MG PO TABS
650.0000 mg | ORAL_TABLET | Freq: Four times a day (QID) | ORAL | Status: DC | PRN
  Administered 2017-02-05: 650 mg via ORAL

## 2017-02-04 MED ORDER — FUROSEMIDE 40 MG PO TABS
40.0000 mg | ORAL_TABLET | Freq: Every day | ORAL | Status: DC
  Administered 2017-02-05 – 2017-02-06 (×2): 40 mg
  Filled 2017-02-04 (×2): qty 1

## 2017-02-04 MED ORDER — APIXABAN 5 MG PO TABS
5.0000 mg | ORAL_TABLET | Freq: Every day | ORAL | Status: DC
  Administered 2017-02-04 – 2017-02-06 (×3): 5 mg
  Filled 2017-02-04 (×3): qty 1

## 2017-02-04 MED ORDER — LEVETIRACETAM 100 MG/ML PO SOLN
750.0000 mg | Freq: Two times a day (BID) | ORAL | Status: DC
  Administered 2017-02-04 – 2017-02-11 (×14): 750 mg
  Filled 2017-02-04 (×15): qty 7.5

## 2017-02-04 MED ORDER — CIPROFLOXACIN HCL 500 MG PO TABS: 500.0000 mg | ORAL_TABLET | Freq: Two times a day (BID) | ORAL | Status: DC

## 2017-02-04 MED ORDER — ONDANSETRON HCL 4 MG/2ML IJ SOLN
4.0000 mg | Freq: Four times a day (QID) | INTRAMUSCULAR | Status: DC | PRN
  Filled 2017-02-04: qty 2

## 2017-02-04 MED ORDER — SODIUM CHLORIDE 0.9% FLUSH
3.0000 mL | Freq: Two times a day (BID) | INTRAVENOUS | Status: DC
  Administered 2017-02-04 – 2017-02-05 (×2): 3 mL via INTRAVENOUS

## 2017-02-04 MED ORDER — SODIUM CHLORIDE 0.9% FLUSH: 3.0000 mL | INTRAVENOUS | Status: DC | PRN

## 2017-02-04 MED ORDER — ALTEPLASE 2 MG IJ SOLR
2.0000 mg | Freq: Once | INTRAMUSCULAR | Status: AC
  Administered 2017-02-04: 2 mg

## 2017-02-04 MED ORDER — BACLOFEN 10 MG PO TABS
10.0000 mg | ORAL_TABLET | Freq: Three times a day (TID) | ORAL | Status: DC
  Administered 2017-02-04 – 2017-02-11 (×20): 10 mg
  Filled 2017-02-04 (×20): qty 1

## 2017-02-04 MED ORDER — CIPROFLOXACIN HCL 500 MG PO TABS
500.0000 mg | ORAL_TABLET | Freq: Two times a day (BID) | ORAL | Status: AC
  Administered 2017-02-04 – 2017-02-05 (×3): 500 mg
  Filled 2017-02-04 (×3): qty 1

## 2017-02-04 MED ORDER — HYDRALAZINE HCL 50 MG PO TABS
100.0000 mg | ORAL_TABLET | Freq: Three times a day (TID) | ORAL | Status: DC
  Administered 2017-02-04 – 2017-02-11 (×19): 100 mg
  Filled 2017-02-04 (×19): qty 2

## 2017-02-04 MED ORDER — GABAPENTIN 300 MG PO CAPS
300.0000 mg | ORAL_CAPSULE | Freq: Two times a day (BID) | ORAL | Status: DC
  Administered 2017-02-04 – 2017-02-11 (×13): 300 mg
  Filled 2017-02-04 (×14): qty 1

## 2017-02-04 MED ORDER — ALTEPLASE 2 MG IJ SOLR
2.0000 mg | Freq: Once | INTRAMUSCULAR | Status: AC
  Administered 2017-02-04: 2 mg
  Filled 2017-02-04 (×2): qty 2

## 2017-02-04 MED ORDER — FUROSEMIDE 10 MG/ML IJ SOLN
40.0000 mg | Freq: Once | INTRAMUSCULAR | Status: AC
  Administered 2017-02-04: 40 mg via INTRAVENOUS
  Filled 2017-02-04: qty 4

## 2017-02-04 MED ORDER — GLUCAGON HCL RDNA (DIAGNOSTIC) 1 MG IJ SOLR
1.0000 mg | Freq: Once | INTRAMUSCULAR | Status: AC
  Administered 2017-02-04: 1 mg via INTRAMUSCULAR
  Filled 2017-02-04: qty 1

## 2017-02-04 MED ORDER — VANCOMYCIN HCL IN DEXTROSE 750-5 MG/150ML-% IV SOLN
750.0000 mg | INTRAVENOUS | Status: AC
  Administered 2017-02-04 – 2017-02-05 (×2): 750 mg via INTRAVENOUS
  Filled 2017-02-04 (×2): qty 150

## 2017-02-04 MED ORDER — ONDANSETRON HCL 4 MG PO TABS: 4.0000 mg | ORAL_TABLET | Freq: Four times a day (QID) | ORAL | Status: DC | PRN

## 2017-02-04 MED ORDER — OXYCODONE-ACETAMINOPHEN 5-325 MG PO TABS
0.5000 | ORAL_TABLET | Freq: Two times a day (BID) | ORAL | Status: DC
  Administered 2017-02-04 – 2017-02-11 (×14): 0.5
  Filled 2017-02-04 (×14): qty 1

## 2017-02-04 MED ORDER — CHOLESTYRAMINE 4 G PO PACK
4.0000 g | PACK | ORAL | Status: DC
  Administered 2017-02-05 – 2017-02-10 (×17): 4 g
  Filled 2017-02-04 (×22): qty 1

## 2017-02-04 MED ORDER — LOSARTAN POTASSIUM 25 MG PO TABS
25.0000 mg | ORAL_TABLET | Freq: Every day | ORAL | Status: DC
  Administered 2017-02-04 – 2017-02-11 (×8): 25 mg
  Filled 2017-02-04 (×8): qty 1

## 2017-02-04 MED ORDER — ISOSORBIDE DINITRATE 20 MG PO TABS
20.0000 mg | ORAL_TABLET | Freq: Three times a day (TID) | ORAL | Status: DC
  Administered 2017-02-04 – 2017-02-11 (×20): 20 mg
  Filled 2017-02-04 (×22): qty 1

## 2017-02-04 MED ORDER — ACETAMINOPHEN 650 MG RE SUPP: 650.0000 mg | Freq: Four times a day (QID) | RECTAL | Status: DC | PRN

## 2017-02-04 MED ORDER — METOPROLOL TARTRATE 50 MG PO TABS
50.0000 mg | ORAL_TABLET | Freq: Two times a day (BID) | ORAL | Status: DC
  Administered 2017-02-04 – 2017-02-06 (×5): 50 mg
  Filled 2017-02-04 (×6): qty 1

## 2017-02-04 MED ORDER — SODIUM CHLORIDE 0.9 % IV SOLN: 250.0000 mL | INTRAVENOUS | Status: DC | PRN

## 2017-02-04 MED ORDER — ONDANSETRON HCL 4 MG PO TABS: 4.0000 mg | ORAL_TABLET | Freq: Three times a day (TID) | ORAL | Status: DC | PRN

## 2017-02-04 MED ORDER — INSULIN ASPART 100 UNIT/ML ~~LOC~~ SOLN: 0.0000 [IU] | Freq: Three times a day (TID) | SUBCUTANEOUS | Status: DC

## 2017-02-04 MED ORDER — FUROSEMIDE 10 MG/ML IJ SOLN: 20.0000 mg | Freq: Once | INTRAMUSCULAR | Status: DC

## 2017-02-04 NOTE — ED Notes (Signed)
Attempt to draw labs (since picc will not pull off), unable to find any site to stick patient due to swelling. Dr. Silverio Lay made aware, will possibly try [place]. Also, waiting for IV team to place alteplase, then possibly could pull off PICC.

## 2017-02-04 NOTE — H&P (Signed)
History and Physical    Dalton Ferguson ZOX:[id number] DOB: March 29, 1951 DOA: 02/04/2017  PCP: Oneal Grout, MD Patient coming from: facility  Chief Complaint: hypoglycemia, anasarca, non-functional PICC  HPI: Dalton Ferguson is a 66 y.o. male with medical history significant hypertension, diabetes, stroke with hemiplegia, seizure disorder, congestive heart failure, blindness in left eye, chronic anemia, nonhealing wound status post debridement at [facility], osteomyelitis presents to the emergency Department chief complaint of hypoglycemia anasarca and nonfunctional PICC line. So evaluation reveals a serum glucose of 50. [facility] asked to admit  Information is obtained from the chart. Patient able to communicate clearly result of previous stroke. Chart review indicates staff concerned about worsening edema. He was seen September 27 and  localized edema of right upper extremity was noted. No reports of any fever chills cough diarrhea. No complaints of pain shortness of breath. Chart review indicates seen yesterday for worsening edema. Trial of Lasix was planned. As well as nutritional consult to assess sodium and protein content of tube feedings.    ED Course: Emergency department is afebrile hemodynamically stable with a blood pressure high end of normal he's not hypoxic he is nontoxic appearing  Review of Systems: As per HPI otherwise all other systems reviewed and are negative.   Ambulatory Status: Mostly bed. Currently a resident at [facility] nursing facility. Full assist with ADLs  Past Medical History:  Diagnosis Date  . Anemia   . Blindness of left eye   . CHF (congestive heart failure) (HCC)   . Depression   . Diabetes mellitus without complication (HCC)   . Hypertension   . Seizures (HCC)   . Stroke [facility])     Past Surgical History:  Procedure Laterality Date  . PEG TUBE PLACEMENT      Social History   Social History  . Marital status: Single    Spouse  name: N/A  . Number of children: N/A  . Years of education: N/A   Occupational History  . Not on file.   Social History Main Topics  . Smoking status: Unknown If Ever Smoked  . Smokeless tobacco: Not on file  . Alcohol use No  . Drug use: Unknown  . Sexual activity: Not Currently   Other Topics Concern  . Not on file   Social History Narrative  . No narrative on file    No Known Allergies  Family History  Problem Relation Age of Onset  . Stroke Mother   . Hypertension Father     Prior to Admission medications   Medication Sig Start Date End Date Taking? Authorizing Provider  apixaban (ELIQUIS) 5 MG TABS tablet Place 5 mg into feeding tube daily.   Yes [provider]  atorvastatin (LIPITOR) 80 MG tablet 80 mg by PEG Tube route at bedtime.   Yes [provider]  baclofen (LIORESAL) 10 MG tablet Place 10 mg into feeding tube 3 (three) times daily.    Yes [provider]  Cholecalciferol (VITAMIN D-3 PO) 3,000 Units by PEG Tube route daily.    Yes [provider]  cholestyramine (QUESTRAN) 4 g packet Place 4 g into feeding tube 3 (three) times daily.   Yes [provider]  ciprofloxacin (CIPRO) 500 MG tablet Place 500 mg into feeding tube 2 (two) times daily. 01/28/17 02/06/17 Yes [provider]  FLUoxetine (PROZAC) 20 MG capsule Place 20 mg into feeding tube daily.   Yes [provider]  furosemide (LASIX) 40 MG tablet Place  40 mg into feeding tube daily.   Yes [provider]  gabapentin (NEURONTIN) 300 MG capsule Place 300 mg into feeding tube 2 (two) times daily.   Yes [provider]  hydrALAZINE (APRESOLINE) 100 MG tablet Place 100 mg into feeding tube every 8 (eight) hours.   Yes [provider]  Insulin Detemir (LEVEMIR FLEXTOUCH) 100 UNIT/ML Pen Inject 30 Units into the skin daily. Give at [facility]   Yes [provider]  isosorbide dinitrate (ISORDIL) 20 MG tablet Place 20 mg  into feeding tube 3 (three) times daily.   Yes [provider]  LACTOBACILLUS PO 1 tablet by PEG Tube route 2 (two) times daily.    Yes [provider]  levETIRAcetam (KEPPRA) 100 MG/ML solution Place 750 mg into feeding tube 2 (two) times daily.   Yes [provider]  losartan (COZAAR) 25 MG tablet Place 25 mg into feeding tube daily.   Yes [provider]  metoprolol tartrate (LOPRESSOR) 50 MG tablet Place 50 mg into feeding tube 2 (two) times daily.   Yes [provider]  ondansetron (ZOFRAN) 4 MG tablet Place 4 mg into feeding tube every 8 (eight) hours as needed for nausea or vomiting.   Yes [provider]  oxyCODONE-acetaminophen (PERCOCET/ROXICET) 5-325 MG tablet Place 0.5 tablets into feeding tube 2 (two) times daily.   Yes [provider]  Vancomycin HCl in NaCl 750-0.9 MG/250ML-% SOLN Inject 250 mLs into the vein every 18 (eighteen) hours. Pharmacy to dose to keep trough 15-20 01/28/17 02/06/17 Yes [provider]    Physical Exam: Vitals:   02/04/17 1409 02/04/17 1515 02/04/17 1545 02/04/17 1615  BP: 139/71 (!) 189/77 (!) 189/84 (!) 182/76  Pulse: 77 71 61 73  Resp: 20 19 (!) 21 (!) 21  Temp:      TempSrc:      SpO2: 91% 100% 100% 100%     General:  Appears calm and comfortable In no acute distress Eyes:  PERRL, EOMI, normal lids, S to I closed ENT:  grossly normal hearing, lips & tongue, neck is membranes of his mouth are pink slightly dry, very poor dentition Neck:  no LAD, masses or thyromegaly Cardiovascular:  RRR, no m/r/g. 3+ pitting edema lower extremities up to thigh Respiratory:   Normal respiratory effort. Respirations slightly shallow. Diminished air movement. No rhonchi or crackles Abdomen:  soft, ntnd, positive bowel sounds PEG tube intact Skin:  no rash or induration seen on limited exam Musculoskeletal:  Upper and lower extremities with 3+ pitting edema. Tender to palpation. Bilateral  footdrop hands contracted Psychiatric:  grossly normal mood and affect, speech fluent and appropriate, AOx3 Neurologic:  Alert speech uninteligable. Unable to follow commands  Labs on Admission: I have personally reviewed following labs and imaging studies  CBC:  Recent Labs Lab 02/04/17 1255  WBC 7.4  NEUTROABS 4.5  HGB 8.8*  HCT 28.2*  MCV 93.4  PLT 235   Basic Metabolic Panel:  Recent Labs Lab 02/04/17 1255  NA 136  K 3.7  CL 107  CO2 24  GLUCOSE 50*  BUN 21*  CREATININE 0.83  CALCIUM 8.2*   GFR: Estimated Creatinine Clearance: 82.2 mL/min (by C-G formula based on SCr of 0.83 mg/dL). Liver Function Tests:  Recent Labs Lab 02/04/17 1255  AST 33  ALT 22  ALKPHOS 103  BILITOT 0.4  PROT 5.7*  ALBUMIN 2.0*   No results for input(s): LIPASE, AMYLASE in the last 168 hours. No results for  input(s): AMMONIA in the last 168 hours. Coagulation Profile: No results for input(s): INR, PROTIME in the last 168 hours. Cardiac Enzymes: No results for input(s): CKTOTAL, CKMB, CKMBINDEX, TROPONINI in the last 168 hours. BNP (last 3 results) No results for input(s): PROBNP in the last 8760 hours. HbA1C: No results for input(s): HGBA1C in the last 72 hours. CBG:  Recent Labs Lab 02/04/17 1440 02/04/17 1530  GLUCAP 57* 107*   Lipid Profile: No results for input(s): CHOL, HDL, LDLCALC, TRIG, CHOLHDL, LDLDIRECT in the last 72 hours. Thyroid Function Tests: No results for input(s): TSH, T4TOTAL, FREET4, T3FREE, THYROIDAB in the last 72 hours. Anemia Panel: No results for input(s): VITAMINB12, FOLATE, FERRITIN, TIBC, IRON, RETICCTPCT in the last 72 hours. Urine analysis: No results found for: COLORURINE, APPEARANCEUR, LABSPEC, PHURINE, GLUCOSEU, HGBUR, BILIRUBINUR, KETONESUR, PROTEINUR, UROBILINOGEN, NITRITE, LEUKOCYTESUR  Creatinine Clearance: Estimated Creatinine Clearance: 82.2 mL/min (by C-G formula based on SCr of 0.83 mg/dL).  Sepsis  Labs: @LABRCNTIP (procalcitonin:4,lacticidven:4) )No results found for this or any previous visit (from the past 240 hour(s)).   Radiological Exams on Admission: Dg Chest Port 1 View  Result Date: 02/04/2017 CLINICAL DATA:  Status post PICC placement. EXAM: PORTABLE CHEST 1 VIEW COMPARISON:  Radiograph of January 02, 2017. FINDINGS: Stable cardiomegaly is noted. Atherosclerosis of thoracic aorta is noted. Sternotomy wires are noted. Interval placement of left-sided PICC line with distal tip seen in expected position of the SVC. Mild left basilar subsegmental atelectasis is noted with associated minimal pleural effusion. Minimal right pleural effusion is noted. Right upper lobe opacity is noted concerning for possible pneumonia. Bony thorax is unremarkable. No pneumothorax is noted. IMPRESSION: Interval placement of left-sided PICC line with distal tip in expected position of the SVC. Aortic atherosclerosis. Minimal bilateral pleural effusions are noted. Mild left basilar subsegmental atelectasis is noted. Right upper lobe opacity is noted concerning for possible pneumonia. Electronically Signed   By: Lupita Raider, M.D.   On: 02/04/2017 11:08   Dg Ankle Right Port  Result Date: 02/04/2017 CLINICAL DATA:  Cutaneous wound over the right heel positive for MRSA. EXAM: PORTABLE RIGHT ANKLE - 2 VIEW COMPARISON:  MRI of the foot and x-ray of the heel dated August 30th and December 31, 2016 respectively. FINDINGS: The bones are subjectively osteopenic. The destructive changes of the posterior aspect of the calcaneus are not evident currently. There is no fracture. The soft tissues are unremarkable. There is osteopenia of the distal tibia and fibula and of the foot. IMPRESSION: The previously demonstrated loss of the sharp cortical line of the posterior inferior aspect of the calcaneus is no longer evident. No new destructive changes are observed. Electronically Signed   By: David  [place] M.D.   On: 02/04/2017 11:46    EKG: Independently reviewed. Sinus rhythm Nonspecific IVCD with LAD Left ventricular hypertrophy Inferior infarct, old Anterior Q waves, possibly due to LVH Lateral leads are also involved   Assessment/Plan Principal Problem:   Hypoglycemia Active Problems:   History of CVA (cerebrovascular accident)   Seizures (HCC)   Diabetes mellitus type 2, insulin dependent (HCC)   Pressure ulcer of right heel, stage 4 (HCC)   Bacteremia due to Staphylococcus   Anasarca   Hypertension   #1. Hypoglycemia. Etiology uncertain. Serum glucose 50 on admission. He is on tube feedings syrup a tube. He is provided with an amp of D50. Home medications include long-term insulin and sliding scale -Admit to MedSurg -Hold long-acting insulin -Use sliding scale for optimal control -Obtain hemoglobin  A1c   #2. Bacteremia due to Staphylococcus related to non-healing wound and osteo. Patient was on IV vancomycin and cipro. Last day was to be October 4. He missed today's doses due to nonfunctional PICC line. While in the emergency department he's afebrile hemodynamically stable no leukocytosis. IV team cleared PICC line while in the emergency department -Vancomycin per pharmacy -Recommend providing last 2 doses vancomycin and discontinuing PICC line  #3. Pressure ulcer of right heel stage IV/osteomyelitis. See #2. Chart review indicates patient discharged from [facility] 01/28/2017 to [facility]. Prior to that he was at [facility] for completion of IV antibiotics for wound care and nutritional support since September 7. He received aggressive wound care and therapy with Cipro and vancomycin. In addition s/p debridement at [facility]. Chart review also indicates this was associated with osteomyelitis. In addition he had a full-thickness right lateral posterior lower leg wound. Take E-Mycin was to be continued through 02/05/2017 for staphylococcal bacteremia -Pharmacy for vancomycin dosing -Wound care  consult  #4. Anasarca. Worsening. Seen by PCP yesterday who was given a start a Lasix trial. -Lasix po daily -We'll give Lasix IV 20 mg 1 as well -Nutritional consult -Continue tube feeding  #5. History of CVA. Chart review indicates patient at [facility] 2016 with left middle cerebral artery ischemic stroke with hemorrhagic conversion. That time he had a right lower extremity DVT. Stroke was thought to be cardioembolic in origin and anticoagulation was initiated.  He also received PEG tube placement and wound debridement. He was cared for at home for a while. Family had some concern for change in his facial droop -Continue home meds -CT of the head -neuro checks -Continue home meds  #6. CHF. Likely systolic. Chest xray with Mild left basilar subsegmental atelectasis is noted. Right upper lobe opacity is noted concerning for possible pneumonia. Afebrile, no leukocytosis non-toxic appearing. Lactic acid within limits of normal. Oxygen saturation level greater than 90% on room air. Chart review indicates ejection fraction 30-35% 2 years ago. Care everywhere indicates echo September 2018 EF 55%, mild apical hypokinesis, trace tricuspid regurg, mild left atrial enlargement home medications include Lasix,isordil, cozaar, metoprolol. -Continue home meds -Daily weights -Monitor intake and output - #7. Hypertension. Blood pressure with only fair control. -Continue home meds -Monitor  #3. History of seizure disorder. No seizures -Continue home med                                               DVT prophylaxis:  eliquis Code Status: full  Family Communication: none present  Disposition Plan: back to facility  Consults called: none  Admission status: obs    Gwenyth Bender MD [facility]  If 7PM-7AM, please contact night-coverage [email or web address] Password [email or web address]  02/04/2017, 4:59 PM

## 2017-02-04 NOTE — ED Notes (Signed)
Pt's daughter Towanda Malkin called and she is coming to the hospital to visit her father. [phone].

## 2017-02-04 NOTE — ED Notes (Signed)
IV team to return around 1500 to assess PICC. MD made aware of update.

## 2017-02-04 NOTE — Progress Notes (Signed)
Pharmacy Antibiotic Note  BRAND SIEVER is a 66 y.o. male admitted on 02/04/2017 with PICC line issue. Also has staph aureus (likely MRSA?) bacteremia. Patient had osteomyelitis and heel debridement ~1 month ago. Was on vancomycin  IV every 18 hours  and cipro  BID at SNF. Per SNF paper MAR, last vancomycin dose was 10/2 at 9am.  Pharmacy has been consulted for vancomycin dosing. Will continue dosing regimen from SNF through 10/4 to complete full course. WBC 7.4, Scr 0.83, afebrile.   Plan: Vancomycin 750 mg IV every 18 hours.  Goal trough 15-20 mcg/mL.  Continue through 10/4.  Monitor for Scr, c/s, clinical resolution. F/u de-escalation plan/LOT, vancomycin trough as indicated      Temp (24hrs), Avg:99.1 F (37.3 C), Min:99.1 F (37.3 C), Max:99.1 F (37.3 C)   Recent Labs Lab 02/04/17 1255 02/04/17 1310  WBC 7.4  --   CREATININE 0.83  --   LATICACIDVEN  --  1.11    Estimated Creatinine Clearance: 82.2 mL/min (by C-G formula based on SCr of 0.83 mg/dL).    No Known Allergies   Thank you for allowing pharmacy to be a part of this patient's care.  Emeline General, PharmD Candidate 02/04/2017 4:38 PM

## 2017-02-04 NOTE — ED Provider Notes (Signed)
[facility]-EMERGENCY DEPT Provider Note   CSN: [id number] Arrival date & time: 02/04/17  8657     History   Chief Complaint Chief Complaint  Patient presents with  . Vascular Access Problem    HPI TRAVON CROCHET is a 66 y.o. male history of seizures, previous stroke who is bed bound, recent right heel osteomyelitis with bacteremia on IV antibiotics here presenting with possible clotted left PICC line. Patient had osteomyelitis of the right heel and had the debridement done at [facility] about month ago and had bacteremia and needed IV antibiotics for about month so a PICC line was placed. Patient is bedbound as well and is currently in a nursing facility. Nursing facility noticed increased anasarca and noticed that this morning the PICC line does not flush very well. Patient is minimally verbal after the previous stroke and this is baseline and is unable to give much history   The history is provided by the patient, the EMS personnel and the nursing home.    Level V caveat- previous stroke, dementia   Past Medical History:  Diagnosis Date  . Anemia   . Blindness of left eye   . CHF (congestive heart failure) (HCC)   . Depression   . Diabetes mellitus without complication (HCC)   . Hypertension   . Seizures (HCC)   . Stroke [facility])     Patient Active Problem List   Diagnosis Date Noted  . Hypoglycemia 02/04/2017  . Hypertension 02/04/2017  . Anasarca 02/03/2017  . Pressure ulcer of right heel, stage 4 (HCC) 01/29/2017  . Bacteremia due to Staphylococcus 01/29/2017  . History of CVA (cerebrovascular accident) 05/22/2015  . DVT of lower extremity (deep venous thrombosis) (HCC) 05/22/2015  . Seizures (HCC) 05/22/2015  . Depression 05/22/2015  . Diabetes mellitus type 2, insulin dependent (HCC) 05/22/2015    Past Surgical History:  Procedure Laterality Date  . PEG TUBE PLACEMENT         Home Medications    Prior to Admission medications   Medication Sig Start  Date End Date Taking? Authorizing Provider  apixaban (ELIQUIS) 5 MG TABS tablet Place 5 mg into feeding tube daily.   Yes [provider]  atorvastatin (LIPITOR) 80 MG tablet 80 mg by PEG Tube route at bedtime.   Yes [provider]  baclofen (LIORESAL) 10 MG tablet Place 10 mg into feeding tube 3 (three) times daily.    Yes [provider]  Cholecalciferol (VITAMIN D-3 PO) 3,000 Units by PEG Tube route daily.    Yes [provider]  cholestyramine (QUESTRAN) 4 g packet Place 4 g into feeding tube 3 (three) times daily.   Yes [provider]  ciprofloxacin (CIPRO) 500 MG tablet Place 500 mg into feeding tube 2 (two) times daily. 01/28/17 02/06/17 Yes [provider]  FLUoxetine (PROZAC) 20 MG capsule Place 20 mg into feeding tube daily.   Yes [provider]  furosemide (LASIX) 40 MG tablet Place 40 mg into feeding tube daily.   Yes [provider]  gabapentin (NEURONTIN) 300 MG capsule Place 300 mg into feeding tube 2 (two) times daily.   Yes [provider]  hydrALAZINE (APRESOLINE) 100 MG tablet Place 100 mg into feeding tube every 8 (eight) hours.   Yes [provider]  Insulin Detemir (LEVEMIR FLEXTOUCH) 100 UNIT/ML Pen Inject 30 Units into the skin daily. Give at [facility]   Yes [provider]  isosorbide dinitrate (ISORDIL) 20 MG tablet Place 20  mg into feeding tube 3 (three) times daily.   Yes [provider]  LACTOBACILLUS PO 1 tablet by PEG Tube route 2 (two) times daily.    Yes [provider]  levETIRAcetam (KEPPRA) 100 MG/ML solution Place 750 mg into feeding tube 2 (two) times daily.   Yes [provider]  losartan (COZAAR) 25 MG tablet Place 25 mg into feeding tube daily.   Yes [provider]  metoprolol tartrate (LOPRESSOR) 50 MG tablet Place 50 mg into feeding tube 2 (two) times daily.   Yes [provider]  ondansetron (ZOFRAN) 4 MG tablet Place  4 mg into feeding tube every 8 (eight) hours as needed for nausea or vomiting.   Yes [provider]  oxyCODONE-acetaminophen (PERCOCET/ROXICET) 5-325 MG tablet Place 0.5 tablets into feeding tube 2 (two) times daily.   Yes [provider]  Vancomycin HCl in NaCl 750-0.9 MG/250ML-% SOLN Inject 250 mLs into the vein every 18 (eighteen) hours. Pharmacy to dose to keep trough 15-20 01/28/17 02/06/17 Yes [provider]    Family History Family History  Problem Relation Age of Onset  . Stroke Mother   . Hypertension Father     Social History Social History  Substance Use Topics  . Smoking status: Unknown If Ever Smoked  . Smokeless tobacco: Not on file  . Alcohol use No     Allergies   Patient has no known allergies.   Review of Systems Review of Systems  Unable to perform ROS: Dementia  All other systems reviewed and are negative.    Physical Exam Updated Vital Signs BP (!) 189/84   Pulse 61   Temp 99.1 F (37.3 C) (Oral)   Resp (!) 21   SpO2 100%   Physical Exam  Constitutional:  Chronically ill   HENT:  Head: Normocephalic.  Mouth/Throat: Oropharynx is clear and moist.  Eyes: Pupils are equal, round, and reactive to light.  Neck: Normal range of motion.  Cardiovascular: Normal rate, regular rhythm and normal heart sounds.   Pulmonary/Chest: Effort normal.  Abdominal: Soft. Bowel sounds are normal.  Musculoskeletal:  Anasarca. R heel ulcer with no obvious active drainage. L arm PICC line with no obvious cellulitis around it   Neurological:  Alert, L arm contracted. R hemiplegia (chronic)   Skin: Skin is warm.  Nursing note and vitals reviewed.    ED Treatments / Results  Labs (all labs ordered are listed, but only abnormal results are displayed) Labs Reviewed  CBC WITH DIFFERENTIAL/PLATELET - Abnormal; Notable for the following:       Result Value   RBC 3.02 (*)    Hemoglobin 8.8 (*)    HCT 28.2 (*)    All other components  within normal limits  COMPREHENSIVE METABOLIC PANEL - Abnormal; Notable for the following:    Glucose, Bld 50 (*)    BUN 21 (*)    Calcium 8.2 (*)    Total Protein 5.7 (*)    Albumin 2.0 (*)    All other components within normal limits  CBG MONITORING, ED - Abnormal; Notable for the following:    Glucose-Capillary 57 (*)    All other components within normal limits  CBG MONITORING, ED - Abnormal; Notable for the following:    Glucose-Capillary 107 (*)    All other components within normal limits  CULTURE, BLOOD (ROUTINE X 2)  CULTURE, BLOOD (ROUTINE X 2)  HEMOGLOBIN A1C  I-STAT CG4 LACTIC ACID, ED    EKG  EKG  Interpretation  Date/Time:  Wednesday February 04 2017 10:00:22 EDT Ventricular Rate:  69 PR Interval:    QRS Duration: 126 QT Interval:  428 QTC Calculation: 459 R Axis:   -70 Text Interpretation:  Sinus rhythm Nonspecific IVCD with LAD Left ventricular hypertrophy Inferior infarct, old Anterior Q waves, possibly due to LVH Lateral leads are also involved No previous ECGs available Confirmed by Richardean Canal ([id number]) on 02/04/2017 10:22:38 AM       Radiology Dg Chest Port 1 View  Result Date: 02/04/2017 CLINICAL DATA:  Status post PICC placement. EXAM: PORTABLE CHEST 1 VIEW COMPARISON:  Radiograph of January 02, 2017. FINDINGS: Stable cardiomegaly is noted. Atherosclerosis of thoracic aorta is noted. Sternotomy wires are noted. Interval placement of left-sided PICC line with distal tip seen in expected position of the SVC. Mild left basilar subsegmental atelectasis is noted with associated minimal pleural effusion. Minimal right pleural effusion is noted. Right upper lobe opacity is noted concerning for possible pneumonia. Bony thorax is unremarkable. No pneumothorax is noted. IMPRESSION: Interval placement of left-sided PICC line with distal tip in expected position of the SVC. Aortic atherosclerosis. Minimal bilateral pleural effusions are noted. Mild left basilar  subsegmental atelectasis is noted. Right upper lobe opacity is noted concerning for possible pneumonia. Electronically Signed   By: Lupita Raider, M.D.   On: 02/04/2017 11:08   Dg Ankle Right Port  Result Date: 02/04/2017 CLINICAL DATA:  Cutaneous wound over the right heel positive for MRSA. EXAM: PORTABLE RIGHT ANKLE - 2 VIEW COMPARISON:  MRI of the foot and x-ray of the heel dated August 30th and December 31, 2016 respectively. FINDINGS: The bones are subjectively osteopenic. The destructive changes of the posterior aspect of the calcaneus are not evident currently. There is no fracture. The soft tissues are unremarkable. There is osteopenia of the distal tibia and fibula and of the foot. IMPRESSION: The previously demonstrated loss of the sharp cortical line of the posterior inferior aspect of the calcaneus is no longer evident. No new destructive changes are observed. Electronically Signed   By: Azhane Eckart  [place] M.D.   On: 02/04/2017 11:46    Procedures Procedures (including critical care time)  Medications Ordered in ED Medications  furosemide (LASIX) tablet 40 mg (not administered)  hydrALAZINE (APRESOLINE) tablet 100 mg (not administered)  levETIRAcetam (KEPPRA) 100 MG/ML solution 750 mg (not administered)  apixaban (ELIQUIS) tablet 5 mg (not administered)  cholestyramine (QUESTRAN) packet 4 g (not administered)  ciprofloxacin (CIPRO) tablet 500 mg (not administered)  FLUoxetine (PROZAC) capsule 20 mg (not administered)  gabapentin (NEURONTIN) capsule 300 mg (not administered)  isosorbide dinitrate (ISORDIL) tablet 20 mg (not administered)  losartan (COZAAR) tablet 25 mg (not administered)  metoprolol tartrate (LOPRESSOR) tablet 50 mg (not administered)  ondansetron (ZOFRAN) tablet 4 mg (not administered)  oxyCODONE-acetaminophen (PERCOCET/ROXICET) 5-325 MG per tablet 0.5 tablet (not administered)  baclofen (LIORESAL) tablet 10 mg (not administered)  atorvastatin (LIPITOR) tablet 80 mg  (not administered)  sodium chloride flush (NS) 0.9 % injection 3 mL (not administered)  sodium chloride flush (NS) 0.9 % injection 3 mL (not administered)  0.9 %  sodium chloride infusion (not administered)  acetaminophen (TYLENOL) tablet 650 mg (not administered)    Or  acetaminophen (TYLENOL) suppository 650 mg (not administered)  ondansetron (ZOFRAN) tablet 4 mg (not administered)    Or  ondansetron (ZOFRAN) injection 4 mg (not administered)  insulin aspart (novoLOG) injection 0-9 Units (not administered)  alteplase (CATHFLO ACTIVASE) injection 2 mg (2 mg  Intracatheter Given 02/04/17 1321)  alteplase (CATHFLO ACTIVASE) injection 2 mg (2 mg Intracatheter Given 02/04/17 1321)  glucagon (human recombinant) (GLUCAGEN) injection 1 mg (1 mg Intramuscular Given 02/04/17 1452)  furosemide (LASIX) injection 40 mg (40 mg Intravenous Given 02/04/17 1556)     Initial Impression / Assessment and Plan / ED Course  I have reviewed the triage vital signs and the nursing notes.  Pertinent labs & imaging results that were available during my care of the patient were reviewed by me and considered in my medical decision making (see chart for details).     GAUTAM LANGHORST is a 66 y.o. male here because PICC line clotted. Will get xray to confirm PICC location. Will consult PICC nurse to try and assess the line. Afebrile, no signs of line infection currently   2:30 pm  CBG was 50. Given OJ and glucagon and repeat was 150. IV team given TPA to declot the PICC.   4:37 PM Daughter at bedside and told me more concerns. She was concerned for his diffuse edema. He was seen by Dr. Alwyn Ren from [facility] senior care yesterday and was started on lasix but swelling still has not improved. She also states that he is more altered than usual and was concerned for another stroke. He also refused to go back and she was concerned about the care he got there. She had taken care of him at home before and has advance home care.  Will consult hospitalist to admit for AMS, hypoglycemia, anasarca   Final Clinical Impressions(s) / ED Diagnoses   Final diagnoses:  Ankle pain, right    New Prescriptions New Prescriptions   No medications on file     Charlynne Pander, MD 02/04/17 [phone]

## 2017-02-04 NOTE — ED Notes (Signed)
Attempt to flush PICC, will flush but is very difficult. PICC is in place per cxr. Will consult IV team to assess.

## 2017-02-04 NOTE — ED Notes (Addendum)
Glucose 50 in lab work. Dr. Silverio Lay made aware. Reports to give 8 oz OJ in Peg tube then recheck sugar in 15 mins. Pt is alert, asking about his daughter. Notified RN will call to see if she is still coming. Called and no answer.

## 2017-02-04 NOTE — ED Triage Notes (Signed)
Per ems- pt comes from [facility], was admitted 1 week ago after CVA. When admitted had edema to all extremities, and picc line to left arm. Now they are having a hard time getting blood from picc line, and using. This is the reason they sent him. Was given lasix yesterday. Has hemiparesis to right side, aphasia.

## 2017-02-04 NOTE — ED Notes (Signed)
Dr. Silverio Lay at bedside, attempting to draw blood with [place].

## 2017-02-05 ENCOUNTER — Inpatient Hospital Stay (HOSPITAL_COMMUNITY): Payer: Medicare Other

## 2017-02-05 DIAGNOSIS — I110 Hypertensive heart disease with heart failure: Secondary | ICD-10-CM | POA: Diagnosis present

## 2017-02-05 DIAGNOSIS — Z515 Encounter for palliative care: Secondary | ICD-10-CM | POA: Diagnosis present

## 2017-02-05 DIAGNOSIS — I69920 Aphasia following unspecified cerebrovascular disease: Secondary | ICD-10-CM | POA: Diagnosis not present

## 2017-02-05 DIAGNOSIS — E10649 Type 1 diabetes mellitus with hypoglycemia without coma: Secondary | ICD-10-CM | POA: Diagnosis not present

## 2017-02-05 DIAGNOSIS — T82514A Breakdown (mechanical) of infusion catheter, initial encounter: Secondary | ICD-10-CM | POA: Diagnosis present

## 2017-02-05 DIAGNOSIS — G40909 Epilepsy, unspecified, not intractable, without status epilepticus: Secondary | ICD-10-CM | POA: Diagnosis present

## 2017-02-05 DIAGNOSIS — D638 Anemia in other chronic diseases classified elsewhere: Secondary | ICD-10-CM | POA: Diagnosis present

## 2017-02-05 DIAGNOSIS — Z7189 Other specified counseling: Secondary | ICD-10-CM | POA: Diagnosis not present

## 2017-02-05 DIAGNOSIS — E1169 Type 2 diabetes mellitus with other specified complication: Secondary | ICD-10-CM | POA: Diagnosis present

## 2017-02-05 DIAGNOSIS — M868X7 Other osteomyelitis, ankle and foot: Secondary | ICD-10-CM | POA: Diagnosis not present

## 2017-02-05 DIAGNOSIS — R1111 Vomiting without nausea: Secondary | ICD-10-CM | POA: Diagnosis not present

## 2017-02-05 DIAGNOSIS — R7881 Bacteremia: Secondary | ICD-10-CM | POA: Diagnosis present

## 2017-02-05 DIAGNOSIS — B957 Other staphylococcus as the cause of diseases classified elsewhere: Secondary | ICD-10-CM | POA: Diagnosis present

## 2017-02-05 DIAGNOSIS — G8220 Paraplegia, unspecified: Secondary | ICD-10-CM | POA: Diagnosis present

## 2017-02-05 DIAGNOSIS — E162 Hypoglycemia, unspecified: Secondary | ICD-10-CM | POA: Diagnosis not present

## 2017-02-05 DIAGNOSIS — M86171 Other acute osteomyelitis, right ankle and foot: Secondary | ICD-10-CM | POA: Diagnosis not present

## 2017-02-05 DIAGNOSIS — R1110 Vomiting, unspecified: Secondary | ICD-10-CM | POA: Diagnosis not present

## 2017-02-05 DIAGNOSIS — R601 Generalized edema: Secondary | ICD-10-CM | POA: Diagnosis not present

## 2017-02-05 DIAGNOSIS — Y712 Prosthetic and other implants, materials and accessory cardiovascular devices associated with adverse incidents: Secondary | ICD-10-CM | POA: Diagnosis present

## 2017-02-05 DIAGNOSIS — R627 Adult failure to thrive: Secondary | ICD-10-CM | POA: Diagnosis present

## 2017-02-05 DIAGNOSIS — L8992 Pressure ulcer of unspecified site, stage 2: Secondary | ICD-10-CM | POA: Diagnosis not present

## 2017-02-05 DIAGNOSIS — L89614 Pressure ulcer of right heel, stage 4: Secondary | ICD-10-CM | POA: Diagnosis present

## 2017-02-05 DIAGNOSIS — H5462 Unqualified visual loss, left eye, normal vision right eye: Secondary | ICD-10-CM | POA: Diagnosis present

## 2017-02-05 DIAGNOSIS — M869 Osteomyelitis, unspecified: Secondary | ICD-10-CM | POA: Diagnosis not present

## 2017-02-05 DIAGNOSIS — Z794 Long term (current) use of insulin: Secondary | ICD-10-CM | POA: Diagnosis not present

## 2017-02-05 DIAGNOSIS — G9340 Encephalopathy, unspecified: Secondary | ICD-10-CM | POA: Diagnosis present

## 2017-02-05 DIAGNOSIS — J9811 Atelectasis: Secondary | ICD-10-CM | POA: Diagnosis present

## 2017-02-05 DIAGNOSIS — I69351 Hemiplegia and hemiparesis following cerebral infarction affecting right dominant side: Secondary | ICD-10-CM | POA: Diagnosis not present

## 2017-02-05 DIAGNOSIS — I071 Rheumatic tricuspid insufficiency: Secondary | ICD-10-CM | POA: Diagnosis present

## 2017-02-05 DIAGNOSIS — E119 Type 2 diabetes mellitus without complications: Secondary | ICD-10-CM | POA: Diagnosis not present

## 2017-02-05 DIAGNOSIS — Z86718 Personal history of other venous thrombosis and embolism: Secondary | ICD-10-CM | POA: Diagnosis not present

## 2017-02-05 DIAGNOSIS — F0150 Vascular dementia without behavioral disturbance: Secondary | ICD-10-CM | POA: Diagnosis present

## 2017-02-05 DIAGNOSIS — E11649 Type 2 diabetes mellitus with hypoglycemia without coma: Secondary | ICD-10-CM | POA: Diagnosis present

## 2017-02-05 DIAGNOSIS — R4182 Altered mental status, unspecified: Secondary | ICD-10-CM | POA: Diagnosis not present

## 2017-02-05 DIAGNOSIS — K567 Ileus, unspecified: Secondary | ICD-10-CM | POA: Diagnosis present

## 2017-02-05 DIAGNOSIS — Z8673 Personal history of transient ischemic attack (TIA), and cerebral infarction without residual deficits: Secondary | ICD-10-CM | POA: Diagnosis not present

## 2017-02-05 DIAGNOSIS — I5022 Chronic systolic (congestive) heart failure: Secondary | ICD-10-CM | POA: Diagnosis present

## 2017-02-05 LAB — CBC
HCT: 23.6 % — ABNORMAL LOW (ref 39.0–52.0)
HEMOGLOBIN: 7.7 g/dL — AB (ref 13.0–17.0)
MCH: 30.3 pg (ref 26.0–34.0)
MCHC: 32.6 g/dL (ref 30.0–36.0)
MCV: 92.9 fL (ref 78.0–100.0)
PLATELETS: 189 10*3/uL (ref 150–400)
RBC: 2.54 MIL/uL — ABNORMAL LOW (ref 4.22–5.81)
RDW: 15.1 % (ref 11.5–15.5)
WBC: 6 10*3/uL (ref 4.0–10.5)

## 2017-02-05 LAB — GLUCOSE, CAPILLARY
GLUCOSE-CAPILLARY: 118 mg/dL — AB (ref 65–99)
GLUCOSE-CAPILLARY: 124 mg/dL — AB (ref 65–99)
GLUCOSE-CAPILLARY: 147 mg/dL — AB (ref 65–99)
Glucose-Capillary: 113 mg/dL — ABNORMAL HIGH (ref 65–99)
Glucose-Capillary: 144 mg/dL — ABNORMAL HIGH (ref 65–99)
Glucose-Capillary: 158 mg/dL — ABNORMAL HIGH (ref 65–99)
Glucose-Capillary: 166 mg/dL — ABNORMAL HIGH (ref 65–99)
Glucose-Capillary: 57 mg/dL — ABNORMAL LOW (ref 65–99)
Glucose-Capillary: 75 mg/dL (ref 65–99)
Glucose-Capillary: 87 mg/dL (ref 65–99)

## 2017-02-05 LAB — BASIC METABOLIC PANEL
ANION GAP: 8 (ref 5–15)
BUN: 19 mg/dL (ref 6–20)
CALCIUM: 8.3 mg/dL — AB (ref 8.9–10.3)
CO2: 24 mmol/L (ref 22–32)
Chloride: 107 mmol/L (ref 101–111)
Creatinine, Ser: 0.87 mg/dL (ref 0.61–1.24)
GFR calc Af Amer: >60 mL/min (ref >60)
GLUCOSE: 62 mg/dL — AB (ref 65–99)
Potassium: 4.1 mmol/L (ref 3.5–5.1)
Sodium: 139 mmol/L (ref 135–145)

## 2017-02-05 LAB — HEMOGLOBIN AND HEMATOCRIT, BLOOD
HEMATOCRIT: 26.6 % — AB (ref 39.0–52.0)
HEMOGLOBIN: 8.4 g/dL — AB (ref 13.0–17.0)

## 2017-02-05 LAB — MRSA PCR SCREENING: MRSA by PCR: POSITIVE — AB

## 2017-02-05 MED ORDER — ONDANSETRON HCL 4 MG/2ML IJ SOLN
4.0000 mg | Freq: Four times a day (QID) | INTRAMUSCULAR | Status: DC
  Administered 2017-02-05 – 2017-02-06 (×5): 4 mg via INTRAVENOUS
  Filled 2017-02-05 (×4): qty 2

## 2017-02-05 MED ORDER — DEXTROSE-NACL 5-0.9 % IV SOLN
INTRAVENOUS | Status: DC
  Administered 2017-02-05: 07:00:00 via INTRAVENOUS

## 2017-02-05 MED ORDER — SODIUM CHLORIDE 0.9% FLUSH
10.0000 mL | INTRAVENOUS | Status: DC | PRN
  Administered 2017-02-06 – 2017-02-09 (×3): 10 mL
  Administered 2017-02-10: 20 mL
  Administered 2017-02-10: 10 mL
  Filled 2017-02-05 (×5): qty 40

## 2017-02-05 MED ORDER — DEXTROSE 50 % IV SOLN
INTRAVENOUS | Status: AC
  Administered 2017-02-05: 50 mL
  Filled 2017-02-05: qty 50

## 2017-02-05 MED ORDER — JEVITY 1.2 CAL PO LIQD
1000.0000 mL | ORAL | Status: DC
Start: 2017-02-05 — End: 2017-02-05

## 2017-02-05 MED ORDER — GLUCERNA 1.2 CAL PO LIQD
1000.0000 mL | ORAL | Status: DC
  Administered 2017-02-05 – 2017-02-06 (×2): 1000 mL
  Filled 2017-02-05 (×6): qty 1000

## 2017-02-05 NOTE — Care Management Obs Status (Signed)
MEDICARE OBSERVATION STATUS NOTIFICATION   Patient Details  Name: Dalton Ferguson MRN: [id number] Date of Birth: [birth year]   Medicare Observation Status Notification Given:  Yes (patient unable to sign d/t medical condition)    Durenda Guthrie, RN 02/05/2017, 12:35 PM

## 2017-02-05 NOTE — Consult Note (Addendum)
WOC Nurse wound consult note Reason for Consult: Consult requested for several wounds.  Pt previously had a stage 4 pressure injury to the sacrum and was followed at [facility], according to the EMR.  The wound has healed at this time and is pink dry scar tissue.  There is a small partial thickness shear injury in the center; .5X.5X.1cm, pink , moist and irregular shaped with flapped skin.  Scar tissue is high risk for breakdown to re-occur. 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  Bone palpable when probed with a swab. Right posterior calf with healing full thickness wound; 50% pink dry scar tissue, 50% patchy areas of full thickness skin loss which are beefy red and have small amt pink drainage, no odor.  Affected area is 10X2X.2cm Pressure Injury POA: Yes Dressing procedure/placement/frequency: Foam dressing to protect and promote healing to buttocks/sacrum and right posterior leg.  Aquacel to absorb drainage and provide antimicrobial benefits to right heel wound.  Air mattress to reduce pressure, float heel to reduce pressure. Please re-consult if further assistance is needed.  Thank-you,  Cammie Mcgee MSN, RN, CWOCN, [employer], CNS [phone]

## 2017-02-05 NOTE — Progress Notes (Signed)
Pt CBG 38 at  2347, asymptomatic,  hypoglycemic protocol initiated, D50 given at  0008, CBG 124 at 0049, will continue to monitor.

## 2017-02-05 NOTE — Progress Notes (Signed)
PHARMACY - PHYSICIAN COMMUNICATION CRITICAL VALUE ALERT - BLOOD CULTURE IDENTIFICATION (BCID)  No results found for this or any previous visit.  Name of physician (or Provider) ContactedBruna Potter NP  Changes to prescribed antibiotics required: None likely contaminant   Bertram Millard 02/05/2017  10:33 PM

## 2017-02-05 NOTE — Progress Notes (Signed)
PROGRESS NOTE    Dalton Ferguson  WUJ:[id number] DOB: 1950-12-06 DOA: 02/04/2017 PCP: Oneal Grout, MD   Outpatient Specialists:     Brief Narrative:  Dalton Ferguson is a 66 y.o. male with medical history significant hypertension, diabetes, stroke with hemiplegia, seizure disorder, congestive heart failure, blindness in left eye, chronic anemia, nonhealing wound status post debridement at [facility], osteomyelitis presents to the emergency Department chief complaint of hypoglycemia anasarca and nonfunctional PICC line. So evaluation reveals a serum glucose of 50. [facility] asked to admit     Assessment & Plan:   Principal Problem:   Hypoglycemia Active Problems:   History of CVA (cerebrovascular accident)   Seizures (HCC)   Diabetes mellitus type 2, insulin dependent (HCC)   Pressure ulcer of right heel, stage 4 (HCC)   Bacteremia due to Staphylococcus   Anasarca   Hypertension   Hypoglycemia.  -Etiology uncertain -d5IVF -Hold long-acting insulin -accu checks q 2 until sugars normal and then q 4 -hemoglobin A1c: 5.3 -resume tube feeds  Bacteremia due to Staphylococcus related to non-healing wound and osteo.  -IV vancomycin and cipro. Last day was to be October 4. He missed today's doses due to nonfunctional PICC line. While in the emergency department he's afebrile hemodynamically stable no leukocytosis. IV team cleared PICC line while in the emergency department -Vancomycin per pharmacy -Recommend providing last 2 doses vancomycin and discontinuing PICC line  Vomiting -after meds placed in PEG tube -check abd x ray  Pressure ulcer of right heel stage IV/osteomyelitis.  -patient discharged from [facility] 01/28/2017  -Prior to that he was at [facility] for completion of IV antibiotics for wound care and nutritional support since September 7 -Wound care consult - pulses found with doppler to left foot  Anasarca. Worsening. Seen by  PCP recently who started a Lasix trial. -Lasix po daily -Nutritional consult -pre-albumin 23.2 -Continue tube feeding  History of CVA. 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 That time he had a right lower extremity DVT. Stroke was thought to be cardioembolic in origin and anticoagulation was initiated.    H/o CHF. Likely systolic. Chest xray with Mild left basilar subsegmental atelectasis is noted. Right upper lobe opacity is noted concerning for possible pneumonia. Afebrile, no leukocytosis non-toxic appearing. Lactic acid within limits of normal. Oxygen saturation level greater than 90% on room air. Chart review indicates ejection fraction 30-35% 2 years ago. Care everywhere indicates echo September 2018 EF 55%, mild apical hypokinesis, trace tricuspid regurg, mild left atrial enlargement home medications include Lasix,isordil, cozaar, metoprolol. -Continue home meds -Daily weights -Monitor intake and output  Hypertension -Continue home meds -Monitor  History of seizure disorder -Continue home med                                           DVT prophylaxis:  Fully anticoagulated   Code Status: Full Code   Family Communication: Called family, no answer  Disposition Plan:  May need palliative care referral   Consultants:       Subjective: Will nod head to questions-- nods no to question of pain  Objective: Vitals:   02/04/17 1800 02/04/17 1902 02/05/17 0130 02/05/17 0600  BP: (!) 171/79 (!) 165/72 (!) 119/53 (!) 143/65  Pulse: 72 72 60 61  Resp: 17  18 17 16   Temp:  98.8 F (37.1 C) 98 F (36.7 C) 98.2 F (36.8 C)  TempSrc:  Axillary Axillary Axillary  SpO2: 100% 100% 100% 99%    Intake/Output Summary (Last 24 hours) at 02/05/17 1407 Last data filed at 02/05/17 1017  Gross per 24 hour  Intake                0 ml  Output             1400 ml  Net             -1400 ml   There were no vitals filed for this visit.  Examination:  General exam: chronically ill appearing, skin moist Respiratory system: no wheezing Cardiovascular system: left foot cool to touch, regular Gastrointestinal system: +Bs, does not wince with palpation Central nervous system: alert, nods to questions Extremities: does not move extremities to command, contractures Skin: legs wrapped along shin    Data Reviewed: I have personally reviewed following labs and imaging studies  CBC:  Recent Labs Lab 02/04/17 1255 02/05/17 0536 02/05/17 1157  WBC 7.4 6.0  --   NEUTROABS 4.5  --   --   HGB 8.8* 7.7* 8.4*  HCT 28.2* 23.6* 26.6*  MCV 93.4 92.9  --   PLT 235 189  --    Basic Metabolic Panel:  Recent Labs Lab 02/04/17 1255 02/05/17 0536  NA 136 139  K 3.7 4.1  CL 107 107  CO2 24 24  GLUCOSE 50* 62*  BUN 21* 19  CREATININE 0.83 0.87  CALCIUM 8.2* 8.3*   GFR: Estimated Creatinine Clearance: 78.4 mL/min (by C-G formula based on SCr of 0.87 mg/dL). Liver Function Tests:  Recent Labs Lab 02/04/17 1255 02/04/17 2116  AST 33  --   ALT 22  --   ALKPHOS 103  --   BILITOT 0.4  --   PROT 5.7*  --   ALBUMIN 2.0* 2.0*   No results for input(s): LIPASE, AMYLASE in the last 168 hours. No results for input(s): AMMONIA in the last 168 hours. Coagulation Profile: No results for input(s): INR, PROTIME in the last 168 hours. Cardiac Enzymes: No results for input(s): CKTOTAL, CKMB, CKMBINDEX, TROPONINI in the last 168 hours. BNP (last 3 results) No results for input(s): PROBNP in the last 8760 hours. HbA1C:  Recent Labs  02/04/17 1255  HGBA1C 5.3   CBG:  Recent Labs Lab 02/05/17 0312 02/05/17 0558 02/05/17 0657 02/05/17 0801 02/05/17 1108  GLUCAP 75 57* 144* 118* 113*   Lipid Profile: No results for input(s): CHOL, HDL, LDLCALC, TRIG, CHOLHDL, LDLDIRECT in the last 72 hours. Thyroid Function Tests: No results for input(s): TSH, T4TOTAL,  FREET4, T3FREE, THYROIDAB in the last 72 hours. Anemia Panel: No results for input(s): VITAMINB12, FOLATE, FERRITIN, TIBC, IRON, RETICCTPCT in the last 72 hours. Urine analysis:    Component Value Date/Time   COLORURINE STRAW (A) 02/04/2017 2316   APPEARANCEUR CLEAR 02/04/2017 2316   LABSPEC 1.008 02/04/2017 2316   PHURINE 7.0 02/04/2017 2316   GLUCOSEU NEGATIVE 02/04/2017 2316   HGBUR NEGATIVE 02/04/2017 2316   BILIRUBINUR NEGATIVE 02/04/2017 2316   KETONESUR NEGATIVE 02/04/2017 2316   PROTEINUR 100 (A) 02/04/2017 2316   NITRITE NEGATIVE 02/04/2017 2316   LEUKOCYTESUR NEGATIVE 02/04/2017 2316     ) Recent Results (from the past 240 hour(s))  MRSA PCR Screening     Status: Abnormal   Collection Time: 02/05/17  6:03 AM  Result Value Ref Range  Status   MRSA by PCR POSITIVE (A) NEGATIVE Final    Comment:        The GeneXpert MRSA Assay (FDA approved for NASAL specimens only), is one component of a comprehensive MRSA colonization surveillance program. It is not intended to diagnose MRSA infection nor to guide or monitor treatment for MRSA infections. RESULT CALLED TO, READ BACK BY AND VERIFIED WITH: S. CRADDOCK [id number] 10.04.2018 N. MORRIS       Anti-infectives    Start     Dose/Rate Route Frequency Ordered Stop   02/04/17 2100  ciprofloxacin (CIPRO) tablet 500 mg     500 mg Per Tube Every 12 hours 02/04/17 1911 02/05/17 2359   02/04/17 2000  ciprofloxacin (CIPRO) tablet 500 mg  Status:  Discontinued     500 mg Per Tube 2 times daily 02/04/17 1617 02/04/17 1911   02/04/17 1730  vancomycin (VANCOCIN) IVPB 750 mg/150 ml premix     750 mg 150 mL/hr over 60 Minutes Intravenous Every 18 hours 02/04/17 1709 02/05/17 1232       Radiology Studies: Ct Head Wo Contrast  Result Date: 02/04/2017 CLINICAL DATA:  Altered mental status EXAM: CT HEAD WITHOUT CONTRAST TECHNIQUE: Contiguous axial images were obtained from the base of the skull through the vertex without intravenous  contrast. COMPARISON:  Brain MRI 05/06/2016 FINDINGS: Brain: No mass lesion, intraparenchymal hemorrhage or extra-axial collection. No evidence of acute cortical infarct. Severe encephalomalacia at multiple bilateral MCA distribution locations. Vascular: No hyperdense vessel or unexpected calcification. Skull: Normal visualized skull base, calvarium and extracranial soft tissues. Sinuses/Orbits: No sinus fluid levels or advanced mucosal thickening. No mastoid effusion. Left globe prosthesis. IMPRESSION: 1. No acute abnormality. 2. Severe encephalomalacia at the site of multiple remote MCA distribution infarcts bilaterally. Electronically Signed   By: Deatra Robinson M.D.   On: 02/04/2017 19:06   Dg Chest Port 1 View  Result Date: 02/04/2017 CLINICAL DATA:  Status post PICC placement. EXAM: PORTABLE CHEST 1 VIEW COMPARISON:  Radiograph of January 02, 2017. FINDINGS: Stable cardiomegaly is noted. Atherosclerosis of thoracic aorta is noted. Sternotomy wires are noted. Interval placement of left-sided PICC line with distal tip seen in expected position of the SVC. Mild left basilar subsegmental atelectasis is noted with associated minimal pleural effusion. Minimal right pleural effusion is noted. Right upper lobe opacity is noted concerning for possible pneumonia. Bony thorax is unremarkable. No pneumothorax is noted. IMPRESSION: Interval placement of left-sided PICC line with distal tip in expected position of the SVC. Aortic atherosclerosis. Minimal bilateral pleural effusions are noted. Mild left basilar subsegmental atelectasis is noted. Right upper lobe opacity is noted concerning for possible pneumonia. Electronically Signed   By: Lupita Raider, M.D.   On: 02/04/2017 11:08   Dg Ankle Right Port  Result Date: 02/04/2017 CLINICAL DATA:  Cutaneous wound over the right heel positive for MRSA. EXAM: PORTABLE RIGHT ANKLE - 2 VIEW COMPARISON:  MRI of the foot and x-ray of the heel dated August 30th and December 31, 2016 respectively. FINDINGS: The bones are subjectively osteopenic. The destructive changes of the posterior aspect of the calcaneus are not evident currently. There is no fracture. The soft tissues are unremarkable. There is osteopenia of the distal tibia and fibula and of the foot. IMPRESSION: The previously demonstrated loss of the sharp cortical line of the posterior inferior aspect of the calcaneus is no longer evident. No new destructive changes are observed. Electronically Signed   By: David  [place] M.D.  On: 02/04/2017 11:46        Scheduled Meds: . apixaban  5 mg Per Tube Daily  . atorvastatin  80 mg Oral QHS  . baclofen  10 mg Per Tube TID  . cholestyramine  4 g Per Tube 3 times per day  . ciprofloxacin  500 mg Per Tube Q12H  . FLUoxetine  20 mg Per Tube Daily  . furosemide  40 mg Per Tube Daily  . gabapentin  300 mg Per Tube BID  . hydrALAZINE  100 mg Per Tube Q8H  . isosorbide dinitrate  20 mg Per Tube TID  . levETIRAcetam  750 mg Per Tube BID  . losartan  25 mg Per Tube Daily  . metoprolol tartrate  50 mg Per Tube BID  . ondansetron (ZOFRAN) IV  4 mg Intravenous Q6H  . oxyCODONE-acetaminophen  0.5 tablet Per Tube BID   Continuous Infusions: . dextrose 5 % and 0.9% NaCl 50 mL/hr at 02/05/17 0658     LOS: 0 days    Time spent: 35 min    Hashim Eichhorst U Jj Enyeart, DO [facility] Pager [phone]  If 7PM-7AM, please contact night-coverage [email or web address] Password [email or web address] 02/05/2017, 2:08 PM

## 2017-02-05 NOTE — Progress Notes (Signed)
Xray results still pending. Unable to start tube feeding at this time.

## 2017-02-05 NOTE — Progress Notes (Signed)
Initial Nutrition Assessment  DOCUMENTATION CODES:   Not applicable  INTERVENTION:  Initiate Glucerna 1.2 formula @ 30 ml/hr via PEG and increase by 10 ml every 4 hours to goal rate of 70 ml/hr.   Tube feeding regimen provides 2016 kcal (100% of needs), 101 grams of protein, and 1361 ml of H2O.   Upon discharge recommend continuation of Glucerna 1.2 formula or Diabetisource 1.2 formula via PEG at goal rate of 70 ml/hr to provide 2016 kcal (100% of needs), 101 grams of protein, and 1361 ml of H2O. MD to adjust fluids.  NUTRITION DIAGNOSIS:   Inadequate oral intake related to inability to eat as evidenced by NPO status.  GOAL:   Patient will meet greater than or equal to 90% of their needs  MONITOR:   TF tolerance, Weight trends, Labs, I & O's, Skin  REASON FOR ASSESSMENT:   Consult Enteral/tube feeding initiation and management  ASSESSMENT:   66 y.o. male with medical history significant hypertension, diabetes, stroke with hemiplegia, seizure disorder, congestive heart failure, blindness in left eye, chronic anemia, nonhealing wound status post debridement at [facility], osteomyelitis presents to the emergency Department chief complaint of hypoglycemia anasarca and nonfunctional PICC line.  Pt nonverbal however able to nod or shake head yes or no. Pt reports receiving tube feeding via PEG. Per RN, pt from [facility]. RD contacted RN from SNF, she reports pt was on Diabetisource 1.2 formula and was receiving it at rate of either 60 or 70 ml/hr (RN unable to recall specific goal rate). Weight has been stable. RD to order tube feeding. Pt with no observed significant fat or muscle mass loss. Labs and medications reviewed.   Diet Order:     Skin:  Wound (see comment) (Stg 4 to R heel, Stg 2 sacrum, wound to R leg)  Last BM:  PTA  Height:   Ht Readings from Last 1 Encounters:  02/03/17 6' (1.829 m)    Weight:   Wt Readings from Last 1 Encounters:  02/03/17 148  lb 5.9 oz (67.3 kg)    Ideal Body Weight:  81 kg  BMI:  There is no height or weight on file to calculate BMI.  Estimated Nutritional Needs:   Kcal:  2000-2200  Protein:  100-110 grams  Fluid:  Per MD  EDUCATION NEEDS:   No education needs identified at this time  Roslyn Smiling, MS, RD, LDN Pager # [phone] After hours/ weekend pager # [phone]

## 2017-02-05 NOTE — Discharge Instructions (Addendum)
Information on my medicine - ELIQUIS (apixaban)  This medication education was reviewed with me or my healthcare representative as part of my discharge preparation.  The pharmacist that spoke with me during my hospital stay was:  Lennon Alstrom, [facility]  Why was Eliquis prescribed for you? Eliquis was prescribed for you to reduce the risk of a blood clot forming that can cause a stroke.  What do You need to know about Eliquis ? Take your Eliquis TWICE DAILY - one tablet in the morning and one tablet in the evening with or without food. If you have difficulty swallowing the tablet whole please discuss with your pharmacist how to take the medication safely.  Take Eliquis exactly as prescribed by your doctor and DO NOT stop taking Eliquis without talking to the doctor who prescribed the medication.  Stopping may increase your risk of developing a stroke.  Refill your prescription before you run out.  After discharge, you should have regular check-up appointments with your healthcare provider that is prescribing your Eliquis.  In the future your dose may need to be changed if your kidney function or weight changes by a significant amount or as you get older.  What do you do if you miss a dose? If you miss a dose, take it as soon as you remember on the same day and resume taking twice daily.  Do not take more than one dose of ELIQUIS at the same time to make up a missed dose.  Important Safety Information A possible side effect of Eliquis is bleeding. You should call your healthcare provider right away if you experience any of the following: ? Bleeding from an injury or your nose that does not stop. ? Unusual colored urine (red or dark brown) or unusual colored stools (red or black). ? Unusual bruising for unknown reasons. ? A serious fall or if you hit your head (even if there is no bleeding).  Some medicines may interact with Eliquis and might increase your risk of bleeding or clotting  while on Eliquis. To help avoid this, consult your healthcare provider or pharmacist prior to using any new prescription or non-prescription medications, including herbals, vitamins, non-steroidal anti-inflammatory drugs (NSAIDs) and supplements.  This website has more information on Eliquis (apixaban): http://www.eliquis.com/eliquis/home Dressing procedure/placement/frequency: Foam dressing to protect and promote healing to buttocks/sacrum and right posterior leg.  Aquacel to absorb drainage and provide antimicrobial benefits to right heel wound.

## 2017-02-05 NOTE — Progress Notes (Signed)
Pt CBG drop down again to  57 at 0600, hypoglycemic protocol initiated again, D50 . Patient on continues monitoring.

## 2017-02-05 NOTE — Progress Notes (Signed)
Pt vomiting brown-watery vomit. MD notified.

## 2017-02-05 NOTE — Progress Notes (Signed)
Hold tube feeds and medications until xray results back.  May need ng tube vs peg tube to suction. Markea Ruzich DO

## 2017-02-06 DIAGNOSIS — E11649 Type 2 diabetes mellitus with hypoglycemia without coma: Principal | ICD-10-CM

## 2017-02-06 DIAGNOSIS — M86171 Other acute osteomyelitis, right ankle and foot: Secondary | ICD-10-CM

## 2017-02-06 DIAGNOSIS — R627 Adult failure to thrive: Secondary | ICD-10-CM

## 2017-02-06 DIAGNOSIS — K567 Ileus, unspecified: Secondary | ICD-10-CM

## 2017-02-06 DIAGNOSIS — B957 Other staphylococcus as the cause of diseases classified elsewhere: Secondary | ICD-10-CM

## 2017-02-06 DIAGNOSIS — Z8673 Personal history of transient ischemic attack (TIA), and cerebral infarction without residual deficits: Secondary | ICD-10-CM

## 2017-02-06 DIAGNOSIS — R7881 Bacteremia: Secondary | ICD-10-CM

## 2017-02-06 LAB — BLOOD CULTURE ID PANEL (REFLEXED)
Acinetobacter baumannii: NOT DETECTED
CANDIDA ALBICANS: NOT DETECTED
CANDIDA PARAPSILOSIS: NOT DETECTED
Candida glabrata: NOT DETECTED
Candida krusei: NOT DETECTED
Candida tropicalis: NOT DETECTED
ENTEROBACTERIACEAE SPECIES: NOT DETECTED
ENTEROCOCCUS SPECIES: NOT DETECTED
Enterobacter cloacae complex: NOT DETECTED
Escherichia coli: NOT DETECTED
HAEMOPHILUS INFLUENZAE: NOT DETECTED
KLEBSIELLA OXYTOCA: NOT DETECTED
Klebsiella pneumoniae: NOT DETECTED
LISTERIA MONOCYTOGENES: NOT DETECTED
NEISSERIA MENINGITIDIS: NOT DETECTED
PSEUDOMONAS AERUGINOSA: NOT DETECTED
Proteus species: NOT DETECTED
STAPHYLOCOCCUS AUREUS BCID: NOT DETECTED
STAPHYLOCOCCUS SPECIES: NOT DETECTED
STREPTOCOCCUS PYOGENES: NOT DETECTED
STREPTOCOCCUS SPECIES: NOT DETECTED
Serratia marcescens: NOT DETECTED
Streptococcus agalactiae: NOT DETECTED
Streptococcus pneumoniae: NOT DETECTED

## 2017-02-06 LAB — CBC
HEMATOCRIT: 23.4 % — AB (ref 39.0–52.0)
HEMOGLOBIN: 7.5 g/dL — AB (ref 13.0–17.0)
MCH: 30.1 pg (ref 26.0–34.0)
MCHC: 32.1 g/dL (ref 30.0–36.0)
MCV: 94 fL (ref 78.0–100.0)
Platelets: 200 10*3/uL (ref 150–400)
RBC: 2.49 MIL/uL — AB (ref 4.22–5.81)
RDW: 15.2 % (ref 11.5–15.5)
WBC: 6.9 10*3/uL (ref 4.0–10.5)

## 2017-02-06 LAB — BASIC METABOLIC PANEL
ANION GAP: 7 (ref 5–15)
BUN: 17 mg/dL (ref 6–20)
CHLORIDE: 107 mmol/L (ref 101–111)
CO2: 25 mmol/L (ref 22–32)
Calcium: 7.9 mg/dL — ABNORMAL LOW (ref 8.9–10.3)
Creatinine, Ser: 1.01 mg/dL (ref 0.61–1.24)
GFR calc non Af Amer: >60 mL/min (ref >60)
Glucose, Bld: 150 mg/dL — ABNORMAL HIGH (ref 65–99)
POTASSIUM: 3.7 mmol/L (ref 3.5–5.1)
SODIUM: 139 mmol/L (ref 135–145)

## 2017-02-06 LAB — GLUCOSE, CAPILLARY
GLUCOSE-CAPILLARY: 184 mg/dL — AB (ref 65–99)
Glucose-Capillary: 148 mg/dL — ABNORMAL HIGH (ref 65–99)
Glucose-Capillary: 160 mg/dL — ABNORMAL HIGH (ref 65–99)
Glucose-Capillary: 152 mg/dL — ABNORMAL HIGH (ref 65–99)
Glucose-Capillary: 139 mg/dL — ABNORMAL HIGH (ref 65–99)

## 2017-02-06 LAB — C-REACTIVE PROTEIN: CRP: 1.5 mg/dL — AB (ref <1.0)

## 2017-02-06 LAB — VANCOMYCIN, RANDOM: VANCOMYCIN RM: 15

## 2017-02-06 LAB — SEDIMENTATION RATE: Sed Rate: 124 mm/hr — ABNORMAL HIGH (ref 0–16)

## 2017-02-06 MED ORDER — FUROSEMIDE 40 MG PO TABS
40.0000 mg | ORAL_TABLET | Freq: Two times a day (BID) | ORAL | Status: DC
  Administered 2017-02-06 – 2017-02-08 (×4): 40 mg
  Filled 2017-02-06 (×4): qty 1

## 2017-02-06 MED ORDER — VANCOMYCIN HCL IN DEXTROSE 1-5 GM/200ML-% IV SOLN
1000.0000 mg | INTRAVENOUS | Status: DC
Start: 2017-02-06 — End: 2017-02-07
  Administered 2017-02-06: 1000 mg via INTRAVENOUS
  Filled 2017-02-06 (×3): qty 200

## 2017-02-06 NOTE — Progress Notes (Signed)
Pharmacy Antibiotic Note  Dalton Ferguson is a 66 y.o. male admitted on 02/04/2017 with PICC line issue. Also has staph aureus (likely MRSA?) bacteremia. Patient had osteomyelitis and heel debridement ~1 month ago. Was on vancomycin  IV every 18 hours  and cipro  BID at SNF. Per SNF paper MAR, last vancomycin dose was 10/2 at 9am.  Pharmacy has been consulted for vancomycin dosing. Initially vancomycin regimen from SNF was completed but now with positive blood cultures (see pharmacist BCID note 10/5). To resume vancomycin. A random level today is 15 approximately 22.5 hours since last dose.   Plan: Vancomycin 1gm IV Q24H F/u renal fxn, C&S, clinical status and trough at [facility] Consider rechecking blood cultures and consulting ID    Temp (24hrs), Avg:98.7 F (37.1 C), Min:98.2 F (36.8 C), Max:99.4 F (37.4 C)   Recent Labs Lab 02/04/17 1255 02/04/17 1310 02/05/17 0536 02/06/17 0421 02/06/17 0954  WBC 7.4  --  6.0 6.9  --   CREATININE 0.83  --  0.87 1.01  --   LATICACIDVEN  --  1.11  --   --   --   VANCORANDOM  --   --   --   --  15    Estimated Creatinine Clearance: 67.6 mL/min (by C-G formula based on SCr of 1.01 mg/dL).    No Known Allergies   Thank you for allowing pharmacy to be a part of this patient's care.  Maren Wiesen, Drake Leach, PharmD Candidate 02/06/2017 10:46 AM

## 2017-02-06 NOTE — Consult Note (Signed)
[place] [facility]    Date of Admission:  02/04/2017     Total days of antibiotics   01/03/2017 - 02/05/17 Ciprofloxacin for OM (Proteus)   01/03/2017 - 02/05/17 Vancomycin CoNS Bacteremia (2/2 MRSE)       Reason for Consult: bacteremia, osteomyelitis     Referring Provider: Dr. Algis Liming  Primary Care Provider: Blanchie Serve, MD   ASSESSMENT:  Bacteremia  - 1/2 BCx with GPC in clusters and 1/2 with GPC. First BCID invalid and the second w/o ID of organism.  - Tmax 99.4, WBC 6.9 - Previously 12/31/16 was + MRSE 2/2, Staphylococcus pettenkoferi (1/2 - may have been contaminant).    Osteomyelitis Right Heel - Previously tx with Vancomycin / Cipro at [facility] with stop date 10/4 - [place] team has seen patient - appreciate recommendations - Previously bone cx +Proteus from OR culture 01/03/17 - I cannot find the operative report or anything on path to determine what exactly was done/debrideded approx a month ago.  Stroke, non-verbal deficit  - Unable to verbalize but able to shake head appropriately to my questions   PLAN: 1. PICC line will need to come out - whether this is a true bacteremia or not his left arm to me is much more swollen than the right and I fear he has a DVT. There is a chance that there will be two different organisms making it possible this is a double contaminant. Will ask for PIV to be placed. 2. Would continue Vancomycin until organism IDd and we can rule out/in bacteremia.   3. Would recheck blood cultures.  4. Check CRP/ESR - may need to re-image his heel since it has been about a month.     Janene Madeira, MSN, NP-C [facility] Infect[facility] Pager: [phone]  02/06/2017  2:55 PM     Principal Problem:   Hypoglycemia Active Problems:   History of CVA (cerebrovascular accident)   Seizures ([place])   Diabetes mellitus type 2, insulin dependent ([place])   Pressure ulcer of right heel, stage 4  ([place])   Bacteremia due to Staphylococcus   Anasarca   Hypertension   . apixaban  5 mg Per Tube Daily  . atorvastatin  80 mg Oral QHS  . baclofen  10 mg Per Tube TID  . cholestyramine  4 g Per Tube 3 times per day  . FLUoxetine  20 mg Per Tube Daily  . furosemide  40 mg Per Tube Daily  . gabapentin  300 mg Per Tube BID  . hydrALAZINE  100 mg Per Tube Q8H  . isosorbide dinitrate  20 mg Per Tube TID  . levETIRAcetam  750 mg Per Tube BID  . losartan  25 mg Per Tube Daily  . metoprolol tartrate  50 mg Per Tube BID  . ondansetron (ZOFRAN) IV  4 mg Intravenous Q6H  . oxyCODONE-acetaminophen  0.5 tablet Per Tube BID    HPI: Dalton Ferguson is a 66 y.o. male presenting to hospital on 02/04/2017 with hypoglycemia, malfunctioning PICC line and anasarca. PMHx stroke with hemiplegia (G-tube for feeds), seizure disorder, CHF, blindness in left eye, chronic anemia, non healing wound s/[facility] and osteomyelitis of right calcaneous. History was primarily obtained from the chart as the patient is unable to contribute d/t his stroke deficits.   Was recently in care at [facility] on 12/31/16 where he underwent I&D of right heel on 01/03/17 for  osteomyelitis diagnosed on MRI. Bone culture from OR showed pansensitive  2+ Proteus mirabilis. Apparently during this hospitalization he also had 2/2 blood cultures + Staphylococcus pettenkoferi/staphylococcus epidermidis that was oxacillin resistant (cannot find these in [place]). Continued on IV Vancomycin and PO Ciprofloxacin x 4 weeks. TTE was done showing no vegetations. He was transferred to [facility] and then to [place] on 9/26 SNF for care.   Was seen by managing provider at [place] on 10/2 for worsening edema of b/l upper extremities. Was given a trial of lasix to help.   Left ABI: 0.96. Right ABI: 1.0   Review of Systems: Review of Systems  Unable to perform ROS: Patient nonverbal    Past Medical History:  Diagnosis  Date  . Anemia   . Blindness of left eye   . CHF (congestive heart failure) ([place])   . Depression   . Diabetes mellitus without complication ([place])   . Hypertension   . Seizures ([place])   . Stroke [facility])     Social History  Substance Use Topics  . Smoking status: Unknown If Ever Smoked  . Smokeless tobacco: Not on file  . Alcohol use No    Family History  Problem Relation Age of Onset  . Stroke Mother   . Hypertension Father    No Known Allergies  OBJECTIVE: Blood pressure (!) 145/57, pulse 77, temperature 98.2 F (36.8 C), temperature source Oral, resp. rate 17, SpO2 100 %.  Physical Exam  Eyes: No scleral icterus.  Left eye closed   Cardiovascular: Normal rate, regular rhythm and normal heart sounds.   No murmur heard. Pulmonary/Chest: Effort normal and breath sounds normal. No respiratory distress. He has no rales.  Abdominal: Soft. Bowel sounds are normal. There is no tenderness.  G-tube site without drainage   Musculoskeletal: He exhibits edema.  Pitting edema to bilateral upper extremeties. Seems Left > Right but somewhat difficult to assess with contractures.   Bilateral foot drop. 2+ edema to LEs equally.   Right inner calcaneus ulceration. 2 x 2 x 3 cm. Mostly pink wound bed with some yellow. Aquacel and foam dressing in place with some tan/brown drainage.   Lymphadenopathy:    He has no cervical adenopathy.  Skin: Skin is warm and dry.      Lab Results Lab Results  Component Value Date   WBC 6.9 02/06/2017   HGB 7.5 (L) 02/06/2017   HCT 23.4 (L) 02/06/2017   MCV 94.0 02/06/2017   PLT 200 02/06/2017    Lab Results  Component Value Date   CREATININE 1.01 02/06/2017   BUN 17 02/06/2017   NA 139 02/06/2017   K 3.7 02/06/2017   CL 107 02/06/2017   CO2 25 02/06/2017    Lab Results  Component Value Date   ALT 22 02/04/2017   AST 33 02/04/2017   ALKPHOS 103 02/04/2017   BILITOT 0.4 02/04/2017     Microbiology: Recent Results (from the past 240  hour(s))  Blood culture (routine x 2)     Status: None (Preliminary result)   Collection Time: 02/04/17  1:00 PM  Result Value Ref Range Status   Specimen Description BLOOD RIGHT ANTECUBITAL  Final   Special Requests   Final    BOTTLES DRAWN AEROBIC ONLY Blood Culture adequate volume   Culture  Setup Time   Final    GRAM POSITIVE COCCI AEROBIC BOTTLE ONLY CRITICAL RESULT CALLED TO, READ BACK BY AND VERIFIED WITH: [place] [id number]  MLM    Culture GRAM POSITIVE COCCI  Final   Report Status PENDING  Incomplete  Blood Culture ID Panel (Reflexed)     Status: None   Collection Time: 02/04/17  1:00 PM  Result Value Ref Range Status   Enterococcus species NOT DETECTED NOT DETECTED Final   Listeria monocytogenes NOT DETECTED NOT DETECTED Final   Staphylococcus species NOT DETECTED NOT DETECTED Final   Staphylococcus aureus NOT DETECTED NOT DETECTED Final   Streptococcus species NOT DETECTED NOT DETECTED Final   Streptococcus agalactiae NOT DETECTED NOT DETECTED Final   Streptococcus pneumoniae NOT DETECTED NOT DETECTED Final   Streptococcus pyogenes NOT DETECTED NOT DETECTED Final   Acinetobacter baumannii NOT DETECTED NOT DETECTED Final   Enterobacteriaceae species NOT DETECTED NOT DETECTED Final   Enterobacter cloacae complex NOT DETECTED NOT DETECTED Final   Escherichia coli NOT DETECTED NOT DETECTED Final   Klebsiella oxytoca NOT DETECTED NOT DETECTED Final   Klebsiella pneumoniae NOT DETECTED NOT DETECTED Final   Proteus species NOT DETECTED NOT DETECTED Final   Serratia marcescens NOT DETECTED NOT DETECTED Final   Haemophilus influenzae NOT DETECTED NOT DETECTED Final   Neisseria meningitidis NOT DETECTED NOT DETECTED Final   Pseudomonas aeruginosa NOT DETECTED NOT DETECTED Final   Candida albicans NOT DETECTED NOT DETECTED Final   Candida glabrata NOT DETECTED NOT DETECTED Final   Candida krusei NOT DETECTED NOT DETECTED Final   Candida parapsilosis NOT DETECTED NOT  DETECTED Final   Candida tropicalis NOT DETECTED NOT DETECTED Final  Blood culture (routine x 2)     Status: None (Preliminary result)   Collection Time: 02/04/17  8:05 PM  Result Value Ref Range Status   Specimen Description BLOOD BLOOD LEFT HAND  Final   Special Requests IN PEDIATRIC BOTTLE Blood Culture adequate volume  Final   Culture  Setup Time   Final    GRAM POSITIVE COCCI IN CLUSTERS IN PEDIATRIC BOTTLE CRITICAL RESULT CALLED TO, READ BACK BY AND VERIFIED WITH: Hughie Closs PHARMD [id number] 02/05/17 A BROWN[place]    Culture GRAM POSITIVE COCCI  Final   Report Status PENDING  Incomplete  MRSA PCR Screening     Status: Abnormal   Collection Time: 02/05/17  6:03 AM  Result Value Ref Range Status   MRSA by PCR POSITIVE (A) NEGATIVE Final    Comment:        The GeneXpert MRSA Assay (FDA approved for NASAL specimens only), is one component of a comprehensive MRSA colonization surveillance program. It is not intended to diagnose MRSA infection nor to guide or monitor treatment for MRSA infections. RESULT CALLED TO, READ BACK BY AND VERIFIED WITH: S. [place]ADD[place] [id number] 10.04.2018 N. MORRIS     Janene Madeira, MSN, NP-C [place] [facility] [place] Cell: [phone] Pager: [phone]  02/06/2017 2:23 PM

## 2017-02-06 NOTE — Progress Notes (Signed)
Pt. Doing well , no nausea or vomiting this shift, scheduled Zofran given, peg tube feeding initiated last night, rate started at 30 ml/hr at 2100, now at 50 ml/hr.  pt is tolerating it very well, no complains of pain or discomfort, oral care,complete bed bath,  Wound dressing all done this shift, will continue to monitor.

## 2017-02-06 NOTE — Progress Notes (Signed)
  PHARMACY - PHYSICIAN COMMUNICATION CRITICAL VALUE ALERT - BLOOD CULTURE IDENTIFICATION (BCID)  Results for orders placed or performed during the hospital encounter of 02/04/17  Blood Culture ID Panel (Reflexed) (Collected: 02/04/2017  1:00 PM)  Result Value Ref Range   Enterococcus species NOT DETECTED NOT DETECTED   Listeria monocytogenes NOT DETECTED NOT DETECTED   Staphylococcus species NOT DETECTED NOT DETECTED   Staphylococcus aureus NOT DETECTED NOT DETECTED   Streptococcus species NOT DETECTED NOT DETECTED   Streptococcus agalactiae NOT DETECTED NOT DETECTED   Streptococcus pneumoniae NOT DETECTED NOT DETECTED   Streptococcus pyogenes NOT DETECTED NOT DETECTED   Acinetobacter baumannii NOT DETECTED NOT DETECTED   Enterobacteriaceae species NOT DETECTED NOT DETECTED   Enterobacter cloacae complex NOT DETECTED NOT DETECTED   Escherichia coli NOT DETECTED NOT DETECTED   Klebsiella oxytoca NOT DETECTED NOT DETECTED   Klebsiella pneumoniae NOT DETECTED NOT DETECTED   Proteus species NOT DETECTED NOT DETECTED   Serratia marcescens NOT DETECTED NOT DETECTED   Haemophilus influenzae NOT DETECTED NOT DETECTED   Neisseria meningitidis NOT DETECTED NOT DETECTED   Pseudomonas aeruginosa NOT DETECTED NOT DETECTED   Candida albicans NOT DETECTED NOT DETECTED   Candida glabrata NOT DETECTED NOT DETECTED   Candida krusei NOT DETECTED NOT DETECTED   Candida parapsilosis NOT DETECTED NOT DETECTED   Candida tropicalis NOT DETECTED NOT DETECTED    Name of physician (or Provider) Contacted: Dalton Ferguson  Was re-admitted on 10/3 with hypglycemia and anasarca. Has PICC in place since was being treated for osteo / bacteremia and treatment was to end on 10/4. Wound is non-healing. Clinically does not seem to have signs or symptoms of infection currently. Now 1/2 blood cx with GPC in clusters and 1/2 with GPC. First BCID was invalid and the second one did not detect anything. Could be that both blood  cx's are contaminated, but with recent staph aureus bacteremia and PICC this should at least be worked up.   Changes to prescribed antibiotics required: Will plan to restart vancomycin and check VR for now. Consider repeating blood cx's today or tomorrow. Consider ID consult to rule out true infection.   Dalton Ferguson 02/06/2017  9:43 AM

## 2017-02-06 NOTE — Progress Notes (Signed)
PROGRESS NOTE    Dalton Ferguson  MWN:[id number] DOB: [birth year] DOA: 02/04/2017 PCP: Oneal Grout, MD   Outpatient Specialists:     Brief Narrative:  Dalton Ferguson is a 66 y.o. male with medical history significant hypertension, diabetes, stroke with hemiplegia, seizure disorder, congestive heart failure, blindness in left eye, chronic anemia, nonhealing wound status post debridement at [facility], osteomyelitis presents to the emergency Department chief complaint of hypoglycemia anasarca and nonfunctional PICC line. So evaluation reveals a serum glucose of 50. [facility] asked to admit     Assessment & Plan:   Principal Problem:   Hypoglycemia Active Problems:   History of CVA (cerebrovascular accident)   Seizures (HCC)   Diabetes mellitus type 2, insulin dependent (HCC)   Pressure ulcer of right heel, stage 4 (HCC)   Bacteremia due to Staphylococcus   Anasarca   Hypertension   Diabetes with Hypoglycemia.  -Etiology uncertain -Holding long-acting insulin -hemoglobin A1c: 5.3 - Resumed and tolerating tube feeds. No further hypoglycemic episodes since early morning 10/4. DC D5 infusion and monitor closely.  Staphylococcal bacteremia related to non-healing wound and osteo.  -  IV team cleared PICC line use while in ED. - As per outpatient records, patient was supposed to complete course of IV vancomycin and ciprofloxacin on 10/4 which he completed. - Repeat blood cultures from 02/04/17:1 of 2 blood culture shows coag is negative staph. Second blood culture shows gram-positive cocci. BCID negative. Discussed with pharmacy and continued vancomycin pending ID consultation-called 10/5. - Repeat blood cultures 210/5.  Vomiting - Resolved and tolerating PEG tube feeds as per discussion with RN. Monitor. KUB without acute findings.  Pressure ulcer of right heel stage IV/osteomyelitis.  -patient discharged from [facility] 01/28/2017  -Prior to that he  was at [facility] for completion of IV antibiotics for wound care and nutritional support since September 7.  -Wound care consult - pulses found with doppler to left foot  Anasarca. Worsening. Seen by PCP recently who started a Lasix trial. -Lasix po daily. Likely multifactorial related to poor nutritional status, hypoalbuminemia and chronic systolic CHF. Increase Lasix to 40 mg twice a day.  -Nutritional consult -pre-albumin 23.2 -Continue tube feeding  History of CVA. Chart review indicates patient at [facility] 2016 with left middle cerebral artery ischemic stroke with hemorrhagic conversion. That time he had a right lower extremity DVT. Stroke was thought to be cardioembolic in origin and anticoagulation was initiated.   Patient is aphasic but nods head to few questions. Contractures and hemiplegia on the right side. Also seems paraplegic.  Chronic systolic CHF. Chest xray with Mild left basilar subsegmental atelectasis is noted. Right upper lobe opacity is noted concerning for possible pneumonia. Afebrile, no leukocytosis non-toxic appearing. Lactic acid within limits of normal. Oxygen saturation level greater than 90% on room air. Chart review indicates ejection fraction 30-35% 2 years ago. Care everywhere indicates echo September 2018 EF 55%, mild apical hypokinesis, trace tricuspid regurg, mild left atrial enlargement home medications include Lasix,isordil, cozaar, metoprolol. - Increase Lasix and follow.   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   Adult failure to thrive  - Multifactorial. Consult palliative care for goals of care.  Anemia     likely due to chronic disease. Baseline hemoglobin probably in the 8-9 gram range. This has fluctuated over the last 24 hours and down to 7.5.  No overt bleeding reported. Follow CBC in a.m. May consider transfusion if hemoglobin <7 if family wishes to pursue aggressive treatment.  DVT prophylaxis: Fully anticoagulated on Eliquis Code Status:Full Code Family Communication: None at bedside.  Disposition Plan:  Return to SNF when medically improved. Will also depend on palliative care input.    Consultants:    ID  Palliative care     Subjective: Patient nonverbal. Nods head to questions. Denies pain. As per RN, no further nausea, vomiting or diarrhea since last night. Tolerating tube feeds.   Objective: Vitals:   02/05/17 2038 02/06/17 0423 02/06/17 0826 02/06/17 1608  BP: (!) 144/60 (!) 142/66 (!) 145/57 (!) 156/59  Pulse: 86 73 77 80  Resp: 17 18 17 18   Temp: 99.4 F (37.4 C) 98.6 F (37 C) 98.2 F (36.8 C) 99.3 F (37.4 C)  TempSrc: Oral Oral Oral Oral  SpO2: 100% 100% 100% 100%    Intake/Output Summary (Last 24 hours) at 02/06/17 1631 Last data filed at 02/06/17 1609  Gross per 24 hour  Intake          1176.33 ml  Output              825 ml  Net           351.33 ml    Examination:  General exam: Middle-aged male, small built and frail, chronically ill-looking lying comfortably propped up in bed.  Respiratory system: clear to auscultation. No increased work of breathing.  Cardiovascular system: S1 and S2 heard, RRR. No JVD or murmurs. 3+ pitting edema of all extremities but mostly of lower extremities.  Gastrointestinal system: nondistended, soft and nontender. Normal bowel sounds heard. PEG tube intact without acute findings.  Central nervous system: alert and seems oriented, nods to simple questions. Aphasic/nonverbal. Left eye closed, appears chronic.  Extremities: 3+ pitting edema of all extremities but more so of legs. Right upper extremity contractures. Some movement of left upper extremity but 0 x 5 movement in lower extremity.  Skin: Left heel dressing clean and dry. Mid sacral area with stage I  wound which appears mostly as macerated skin but no clear open wound.    Data Reviewed: I have personally reviewed following labs and imaging studies  CBC:  Recent Labs Lab 02/04/17 1255 02/05/17 0536 02/05/17 1157 02/06/17 0421  WBC 7.4 6.0  --  6.9  NEUTROABS 4.5  --   --   --   HGB 8.8* 7.7* 8.4* 7.5*  HCT 28.2* 23.6* 26.6* 23.4*  MCV 93.4 92.9  --  94.0  PLT 235 189  --  200   Basic Metabolic Panel:  Recent Labs Lab 02/04/17 1255 02/05/17 0536 02/06/17 0421  NA 136 139 139  K 3.7 4.1 3.7  CL 107 107 107  CO2 24 24 25   GLUCOSE 50* 62* 150*  BUN 21* 19 17  CREATININE 0.83 0.87 1.01  CALCIUM 8.2* 8.3* 7.9*   GFR: Estimated Creatinine Clearance: 67.6 mL/min (by C-G formula based on SCr of 1.01 mg/dL). Liver Function Tests:  Recent Labs Lab 02/04/17 1255 02/04/17 2116  AST 33  --   ALT 22  --   ALKPHOS 103  --   BILITOT 0.4  --   PROT 5.7*  --   ALBUMIN 2.0* 2.0*   HbA1C:  Recent Labs  02/04/17 1255  HGBA1C 5.3   CBG:  Recent Labs Lab 02/05/17 2354 02/06/17 0419 02/06/17 0759 02/06/17 1200 02/06/17 1605  GLUCAP 158* 148* 160* 184* 152*   Urine analysis:    Component Value Date/Time   COLORURINE STRAW (A) 02/04/2017 2316   APPEARANCEUR CLEAR 02/04/2017 2316   LABSPEC 1.008 02/04/2017 2316   PHURINE 7.0 02/04/2017 2316   GLUCOSEU NEGATIVE 02/04/2017 2316   HGBUR NEGATIVE 02/04/2017 2316   BILIRUBINUR NEGATIVE 02/04/2017 2316   KETONESUR NEGATIVE 02/04/2017 2316   PROTEINUR 100 (A) 02/04/2017 2316   NITRITE NEGATIVE 02/04/2017 2316   LEUKOCYTESUR NEGATIVE 02/04/2017 2316     ) Recent Results (from the past 240 hour(s))  Blood culture (routine x 2)     Status: None (Preliminary result)   Collection Time: 02/04/17  1:00 PM  Result Value Ref Range Status   Specimen Description BLOOD RIGHT ANTECUBITAL  Final   Special Requests   Final    BOTTLES DRAWN AEROBIC ONLY Blood Culture adequate volume   Culture  Setup Time   Final     GRAM POSITIVE COCCI AEROBIC BOTTLE ONLY CRITICAL RESULT CALLED TO, READ BACK BY AND VERIFIED WITH: PHARMD N BATCHELDER [phone] MLM    Culture GRAM POSITIVE COCCI  Final   Report Status PENDING  Incomplete  Blood Culture ID Panel (Reflexed)     Status: None   Collection Time: 02/04/17  1:00 PM  Result Value Ref Range Status   Enterococcus species NOT DETECTED NOT DETECTED Final   Listeria monocytogenes NOT DETECTED NOT DETECTED Final   Staphylococcus species NOT DETECTED NOT DETECTED Final   Staphylococcus aureus NOT DETECTED NOT DETECTED Final   Streptococcus species NOT DETECTED NOT DETECTED Final   Streptococcus agalactiae NOT DETECTED NOT DETECTED Final   Streptococcus pneumoniae NOT DETECTED NOT DETECTED Final   Streptococcus pyogenes NOT DETECTED NOT DETECTED Final   Acinetobacter baumannii NOT DETECTED NOT DETECTED Final   Enterobacteriaceae species NOT DETECTED NOT DETECTED Final   Enterobacter cloacae complex NOT DETECTED NOT DETECTED Final   Escherichia coli NOT DETECTED NOT DETECTED Final   Klebsiella oxytoca NOT DETECTED NOT DETECTED Final   Klebsiella pneumoniae NOT DETECTED NOT DETECTED Final   Proteus species NOT DETECTED NOT DETECTED Final   Serratia marcescens NOT DETECTED NOT DETECTED Final   Haemophilus influenzae NOT DETECTED NOT DETECTED Final   Neisseria meningitidis NOT DETECTED NOT DETECTED Final   Pseudomonas aeruginosa NOT DETECTED NOT DETECTED Final   Candida albicans NOT DETECTED NOT DETECTED Final   Candida glabrata NOT DETECTED NOT DETECTED Final   Candida krusei NOT DETECTED NOT DETECTED Final   Candida parapsilosis NOT DETECTED NOT DETECTED Final   Candida tropicalis NOT DETECTED NOT DETECTED Final  Blood culture (routine x 2)     Status: Abnormal (Preliminary result)   Collection Time: 02/04/17  8:05 PM  Result Value Ref Range Status   Specimen Description BLOOD BLOOD LEFT HAND  Final   Special Requests IN PEDIATRIC BOTTLE Blood Culture  adequate volume  Final   Culture  Setup Time   Final    GRAM POSITIVE COCCI IN CLUSTERS IN PEDIATRIC BOTTLE CRITICAL RESULT CALLED TO, READ BACK BY AND VERIFIED WITH: Lytle Butte PHARMD [id number] 02/05/17 A BROWNING    Culture (A)  Final    STAPHYLOCOCCUS SPECIES (COAGULASE NEGATIVE) SUSCEPTIBILITIES TO FOLLOW    Report Status PENDING  Incomplete  MRSA PCR Screening     Status: Abnormal   Collection Time: 02/05/17  6:03 AM  Result Value Ref Range Status   MRSA by PCR POSITIVE (A) NEGATIVE Final    Comment:        The GeneXpert MRSA Assay (FDA approved for NASAL specimens only), is one component of a comprehensive MRSA colonization surveillance program. It is not intended to diagnose MRSA infection nor to guide or monitor treatment for MRSA infections. RESULT CALLED TO, READ BACK BY AND VERIFIED WITH: S. CRADDOCK [id number] 10.04.2018 N. MORRIS       Anti-infectives    Start     Dose/Rate Route Frequency Ordered Stop   02/06/17 1200  vancomycin (VANCOCIN) IVPB 1000 mg/200 mL premix     1,000 mg 200 mL/hr over 60 Minutes Intravenous Every 24 hours 02/06/17 1046     02/04/17 2100  ciprofloxacin (CIPRO) tablet 500 mg     500 mg Per Tube Every 12 hours 02/04/17 1911 02/05/17 2125   02/04/17 2000  ciprofloxacin (CIPRO) tablet 500 mg  Status:  Discontinued     500 mg Per Tube 2 times daily 02/04/17 1617 02/04/17 1911   02/04/17 1730  vancomycin (VANCOCIN) IVPB 750 mg/150 ml premix     750 mg 150 mL/hr over 60 Minutes Intravenous Every 18 hours 02/04/17 1709 02/05/17 1232       Radiology Studies: Ct Head Wo Contrast  Result Date: 02/04/2017 CLINICAL DATA:  Altered mental status EXAM: CT HEAD WITHOUT CONTRAST TECHNIQUE: Contiguous axial images were obtained from the base of the skull through the vertex without intravenous contrast. COMPARISON:  Brain MRI 05/06/2016 FINDINGS: Brain: No mass lesion, intraparenchymal hemorrhage or extra-axial collection. No evidence of acute cortical infarct.  Severe encephalomalacia at multiple bilateral MCA distribution locations. Vascular: No hyperdense vessel or unexpected calcification. Skull: Normal visualized skull base, calvarium and extracranial soft tissues. Sinuses/Orbits: No sinus fluid levels or advanced mucosal thickening. No mastoid effusion. Left globe prosthesis. IMPRESSION: 1. No acute abnormality. 2. Severe encephalomalacia at the site of multiple remote MCA distribution infarcts bilaterally. Electronically Signed   By: Deatra Robinson M.D.   On: 02/04/2017 19:06   Dg Abd Portable 1v  Result Date: 02/05/2017 CLINICAL DATA:  Possible ileus. Unknown hx concerning bowel movements and emesis.Hx of DM, CHF, stroke. EXAM: PORTABLE ABDOMEN - 1 VIEW COMPARISON:  01/02/2017 FINDINGS: There is no bowel dilation to suggest obstruction or generalized adynamic ileus. Gastrostomy tube projects in the mid stomach, stable. No evidence of renal or ureteral stones. Stable vascular calcifications are noted along the aorta and iliac and femoral vessels. No acute skeletal abnormality. IMPRESSION: 1. No evidence of bowel obstruction or generalized adynamic ileus. No acute findings. Electronically Signed   By: Amie Portland M.D.   On: 02/05/2017 19:04        Scheduled Meds: . apixaban  5 mg Per Tube Daily  . atorvastatin  80 mg Oral QHS  . baclofen  10 mg Per Tube TID  . cholestyramine  4 g Per Tube 3 times per day  . FLUoxetine  20 mg Per Tube Daily  . furosemide  40 mg Per Tube Daily  . gabapentin  300 mg Per Tube BID  . hydrALAZINE  100 mg Per Tube Q8H  . isosorbide dinitrate  20 mg Per Tube TID  . levETIRAcetam  750 mg Per Tube BID  . losartan  25 mg Per Tube Daily  . metoprolol tartrate  50 mg Per Tube BID  . ondansetron (ZOFRAN) IV  4 mg Intravenous Q6H  . oxyCODONE-acetaminophen  0.5 tablet Per  Tube BID   Continuous Infusions: . dextrose 5 % and 0.9% NaCl 50 mL/hr at 02/05/17 0658  . feeding supplement (GLUCERNA 1.2 CAL) 1,000 mL (02/05/17  2110)  . vancomycin 1,000 mg (02/06/17 1503)     LOS: 1 day    Time spent: 35 min    Genae Strine, MD, FACP, FHM. [facility] Pager [phone]  If 7PM-7AM, please contact night-coverage [email or web address] Password [email or web address] 02/06/2017, 4:47 PM

## 2017-02-07 DIAGNOSIS — Z515 Encounter for palliative care: Secondary | ICD-10-CM

## 2017-02-07 DIAGNOSIS — Z7189 Other specified counseling: Secondary | ICD-10-CM

## 2017-02-07 DIAGNOSIS — M868X7 Other osteomyelitis, ankle and foot: Secondary | ICD-10-CM

## 2017-02-07 LAB — CBC
HCT: 23.6 % — ABNORMAL LOW (ref 39.0–52.0)
Hemoglobin: 7.5 g/dL — ABNORMAL LOW (ref 13.0–17.0)
MCH: 30 pg (ref 26.0–34.0)
MCHC: 31.8 g/dL (ref 30.0–36.0)
MCV: 94.4 fL (ref 78.0–100.0)
PLATELETS: 197 10*3/uL (ref 150–400)
RBC: 2.5 MIL/uL — AB (ref 4.22–5.81)
RDW: 14.6 % (ref 11.5–15.5)
WBC: 6.4 10*3/uL (ref 4.0–10.5)

## 2017-02-07 LAB — BASIC METABOLIC PANEL
ANION GAP: 4 — AB (ref 5–15)
BUN: 14 mg/dL (ref 6–20)
CALCIUM: 7.9 mg/dL — AB (ref 8.9–10.3)
CO2: 25 mmol/L (ref 22–32)
Chloride: 108 mmol/L (ref 101–111)
Creatinine, Ser: 0.95 mg/dL (ref 0.61–1.24)
Glucose, Bld: 141 mg/dL — ABNORMAL HIGH (ref 65–99)
POTASSIUM: 3.8 mmol/L (ref 3.5–5.1)
SODIUM: 137 mmol/L (ref 135–145)

## 2017-02-07 LAB — GLUCOSE, CAPILLARY
GLUCOSE-CAPILLARY: 130 mg/dL — AB (ref 65–99)
GLUCOSE-CAPILLARY: 135 mg/dL — AB (ref 65–99)
GLUCOSE-CAPILLARY: 136 mg/dL — AB (ref 65–99)

## 2017-02-07 MED ORDER — METOPROLOL TARTRATE 25 MG/10 ML ORAL SUSPENSION
50.0000 mg | Freq: Two times a day (BID) | ORAL | Status: DC
  Administered 2017-02-07 – 2017-02-11 (×8): 50 mg
  Filled 2017-02-07 (×10): qty 20

## 2017-02-07 MED ORDER — GLUCERNA 1.2 CAL PO LIQD
1000.0000 mL | ORAL | Status: DC
  Administered 2017-02-08: 1000 mL
  Filled 2017-02-07 (×5): qty 1000

## 2017-02-07 MED ORDER — PRO-STAT SUGAR FREE PO LIQD
30.0000 mL | Freq: Three times a day (TID) | ORAL | Status: DC
  Administered 2017-02-07 – 2017-02-09 (×5): 30 mL
  Filled 2017-02-07 (×6): qty 30

## 2017-02-07 MED ORDER — APIXABAN 5 MG PO TABS
5.0000 mg | ORAL_TABLET | Freq: Two times a day (BID) | ORAL | Status: DC
  Administered 2017-02-07 – 2017-02-11 (×9): 5 mg
  Filled 2017-02-07 (×9): qty 1

## 2017-02-07 MED ORDER — ORAL CARE MOUTH RINSE
15.0000 mL | Freq: Two times a day (BID) | OROMUCOSAL | Status: DC
  Administered 2017-02-07 – 2017-02-11 (×6): 15 mL via OROMUCOSAL

## 2017-02-07 NOTE — Progress Notes (Addendum)
Nutrition Follow-up  DOCUMENTATION CODES:  Not applicable  INTERVENTION:  To reduce volume and free water and increase protein: Add prostat 30 ml TID and Decrease glucerna to @ 60 cc/hr to provide 2028 kcal (100% of needs), 131 grams of protein, and 1159 ml of H2O. MD to adjust fluids.   Please re consult RD if patient experiences further episodes of apparent TF intolerance.   Feel low albumin more related to acute phase rather than suboptimal intake. TF meeting estimated needs. WiIll trend weights/labs  NUTRITION DIAGNOSIS:  Inadequate oral intake related to inability to eat as evidenced by NPO status.  GOAL:  Patient will meet greater than or equal to 90% of their needs  MONITOR:  TF tolerance, Weight trends, Labs, I & O's, Skin  REASON FOR ASSESSMENT:  Consult Enteral/tube feeding initiation and management  ASSESSMENT:  65 y.o. male with medical history significant hypertension, diabetes, stroke with hemiplegia, seizure disorder, congestive heart failure, blindness in left eye, chronic anemia, nonhealing wound status post debridement at [facility], osteomyelitis presents to the emergency Department chief complaint of hypoglycemia anasarca and nonfunctional PICC line.  Nutritional re consulted. Per associated MD note, consult thought to be in relation to worsening anasarca, potentially related to poor nutritional status.   Patient had some issues tolerating his TF a couple days ago. He had n/v after his TF was initiated on 10/4. They were subsequently held the remainder of the day. They were restarted yesterday morning and patient appears to be tolerating well at this time.   Spoke with RN who confirms the patient is currently tolerating TF well w/o issues.   TF currently running @ 70. RD assessed pt abdomen. Is slightly firm, but not truly distended, felt more related to his fluid overload. Unfortunately, pt will nod differently when asked the same questions, unable to  gather any information from patient on how he is feeling. He is grimacing and pointing to his stomach though.   His weight in chart is inacurate. Bed weight today is 231 lbs. Though he is fluid overloaded.   Given inability of patient to report intolerance and his past episodes of n/v, will change tf to Glucerna 1.5 to reduce volume. Will also reduce free water in TF. Will provide slightly more protein as well.   Continue to monitor for tolerance and trend weights, labs   Labs: H/H:7.5/23.6, Recent BGs well controlled, all 130-140 today. CRP 1.5,  Albumin 2.0 Meds: Questran, Oxycodone, IV abx, Lasix   Recent Labs Lab 02/05/17 0536 02/06/17 0421 02/07/17 0326  NA 139 139 137  K 4.1 3.7 3.8  CL 107 107 108  CO2 BUN CREATININE 0.87 1.01 0.95  CALCIUM 8.3* 7.9* 7.9*  GLUCOSE 62* 150* 141*   Diet Order:   NPO  Skin:  Stg 4 to R heel, Stg 2 sacrum, wound to R leg, MSAD to groin, perineum.   Last BM:  PTA  Height:  Ht Readings from Last 1 Encounters:  02/03/17 6' (1.829 m)   Weight:  Wt Readings from Last 1 Encounters:  02/03/17 148 lb 5.9 oz (67.3 kg)   Wt Readings from Last 10 Encounters:  02/03/17 148 lb 5.9 oz (67.3 kg)  01/29/17 148 lb 5.9 oz (67.3 kg)  05/22/15 148 lb 6.4 oz (67.3 kg)  05/09/15 148 lb 12.8 oz (67.5 kg)  02/15/15 202 lb (91.6 kg)   Ideal Body Weight:  81 kg  BMI:  There is  no height or weight on file to calculate BMI.  Estimated Nutritional Needs:  Kcal:  2000-2200 Protein:  100-110 grams Fluid:  Per MD  EDUCATION NEEDS:  No education needs identified at this time  Christophe Louis RD, LDN, CNSC Clinical Nutrition Pager: [phone] 02/07/2017 10:32 AM

## 2017-02-07 NOTE — Progress Notes (Signed)
Patient ID: Dalton Ferguson, male   DOB: 02-17-1951, 66 y.o.   MRN: [id number]          [facility]  Date of Admission:  02/04/2017           Day 39 vancomycin ASSESSMENT: Admission blood cultures have grown different species of coagulase-negative staph which suggested that both are contaminants rather than true bacteremia.He just recently completed a course of IV vancomycin and ciprofloxacin for left heel osteomyelitis and coag negative staph bacteremia. He has not been febrile here. I will stop the vancomycin now.  PLAN: 1. Discontinue vancomycin 2. Recommend PICC removal 3. I will sign off now  Principal Problem:   Hypoglycemia Active Problems:   History of CVA (cerebrovascular accident)   Seizures (HCC)   Diabetes mellitus type 2, insulin dependent (HCC)   Pressure ulcer of right heel, stage 4 (HCC)   Coagulase negative Staphylococcus bacteremia   Anasarca   Hypertension   Ileus (HCC)   Acute osteomyelitis of right calcaneus (HCC)   Scheduled Meds: . apixaban  5 mg Per Tube BID  . atorvastatin  80 mg Oral QHS  . baclofen  10 mg Per Tube TID  . cholestyramine  4 g Per Tube 3 times per day  . feeding supplement (PRO-STAT SUGAR FREE 64)  30 mL Per Tube TID  . FLUoxetine  20 mg Per Tube Daily  . furosemide  40 mg Per Tube BID  . gabapentin  300 mg Per Tube BID  . hydrALAZINE  100 mg Per Tube Q8H  . isosorbide dinitrate  20 mg Per Tube TID  . levETIRAcetam  750 mg Per Tube BID  . losartan  25 mg Per Tube Daily  . metoprolol tartrate  50 mg Per Tube BID  . oxyCODONE-acetaminophen  0.5 tablet Per Tube BID   Continuous Infusions: . feeding supplement (GLUCERNA 1.2 CAL) 1,000 mL (02/07/17 1215)  . vancomycin Stopped (02/06/17 1724)   PRN Meds:.acetaminophen **OR** acetaminophen, ondansetron **OR** ondansetron (ZOFRAN) IV, sodium chloride flush  Review of Systems: Review of Systems  Unable to perform ROS: Mental acuity    No Known  Allergies  OBJECTIVE: Vitals:   02/06/17 2032 02/07/17 0539 02/07/17 1048 02/07/17 1156  BP: (!) 141/48 (!) 149/52  (!) 152/76  Pulse: 75   73  Resp: 18     Temp: 98.6 F (37 C) 98.3 F (36.8 C)  98.3 F (36.8 C)  TempSrc: Oral Axillary  Axillary  SpO2: 100% 100%    Weight:   231 lb (104.8 kg)    Body mass index is 31.33 kg/m.  Physical Exam  Constitutional:  He is resting quietly in bed. He is not able to answer questions.  Cardiovascular: Normal rate and regular rhythm.   No murmur heard. Pulmonary/Chest: Effort normal and breath sounds normal.  Abdominal: Soft. There is no tenderness.  Musculoskeletal:  Left foot wrapped.  Neurological: He is alert.  Skin:  Left upper arm PICC in place.    Lab Results Lab Results  Component Value Date   WBC 6.4 02/07/2017   HGB 7.5 (L) 02/07/2017   HCT 23.6 (L) 02/07/2017   MCV 94.4 02/07/2017   PLT 197 02/07/2017    Lab Results  Component Value Date   CREATININE 0.95 02/07/2017   BUN 14 02/07/2017   NA 137 02/07/2017   K 3.8 02/07/2017   CL 108 02/07/2017   CO2 25 02/07/2017    Lab Results  Component Value Date  ALT 22 02/04/2017   AST 33 02/04/2017   ALKPHOS 103 02/04/2017   BILITOT 0.4 02/04/2017     Microbiology: Recent Results (from the past 240 hour(s))  Blood culture (routine x 2)     Status: Abnormal (Preliminary result)   Collection Time: 02/04/17  1:00 PM  Result Value Ref Range Status   Specimen Description BLOOD RIGHT ANTECUBITAL  Final   Special Requests   Final    BOTTLES DRAWN AEROBIC ONLY Blood Culture adequate volume   Culture  Setup Time   Final    GRAM POSITIVE COCCI AEROBIC BOTTLE ONLY CRITICAL RESULT CALLED TO, READ BACK BY AND VERIFIED WITH: PHARMD N BATCHELDER [phone] MLM    Culture STAPHYLOCOCCUS SPECIES (COAGULASE NEGATIVE) (A)  Final   Report Status PENDING  Incomplete  Blood Culture ID Panel (Reflexed)     Status: None   Collection Time: 02/04/17  1:00 PM  Result Value Ref  Range Status   Enterococcus species NOT DETECTED NOT DETECTED Final   Listeria monocytogenes NOT DETECTED NOT DETECTED Final   Staphylococcus species NOT DETECTED NOT DETECTED Final   Staphylococcus aureus NOT DETECTED NOT DETECTED Final   Streptococcus species NOT DETECTED NOT DETECTED Final   Streptococcus agalactiae NOT DETECTED NOT DETECTED Final   Streptococcus pneumoniae NOT DETECTED NOT DETECTED Final   Streptococcus pyogenes NOT DETECTED NOT DETECTED Final   Acinetobacter baumannii NOT DETECTED NOT DETECTED Final   Enterobacteriaceae species NOT DETECTED NOT DETECTED Final   Enterobacter cloacae complex NOT DETECTED NOT DETECTED Final   Escherichia coli NOT DETECTED NOT DETECTED Final   Klebsiella oxytoca NOT DETECTED NOT DETECTED Final   Klebsiella pneumoniae NOT DETECTED NOT DETECTED Final   Proteus species NOT DETECTED NOT DETECTED Final   Serratia marcescens NOT DETECTED NOT DETECTED Final   Haemophilus influenzae NOT DETECTED NOT DETECTED Final   Neisseria meningitidis NOT DETECTED NOT DETECTED Final   Pseudomonas aeruginosa NOT DETECTED NOT DETECTED Final   Candida albicans NOT DETECTED NOT DETECTED Final   Candida glabrata NOT DETECTED NOT DETECTED Final   Candida krusei NOT DETECTED NOT DETECTED Final   Candida parapsilosis NOT DETECTED NOT DETECTED Final   Candida tropicalis NOT DETECTED NOT DETECTED Final  Blood culture (routine x 2)     Status: Abnormal (Preliminary result)   Collection Time: 02/04/17  8:05 PM  Result Value Ref Range Status   Specimen Description BLOOD BLOOD LEFT HAND  Final   Special Requests IN PEDIATRIC BOTTLE Blood Culture adequate volume  Final   Culture  Setup Time   Final    GRAM POSITIVE COCCI IN CLUSTERS IN PEDIATRIC BOTTLE CRITICAL RESULT CALLED TO, READ BACK BY AND VERIFIED WITH: Lytle Butte PHARMD [id number] 02/05/17 A BROWNING    Culture (A)  Final    STAPHYLOCOCCUS SPECIES (COAGULASE NEGATIVE) SUSCEPTIBILITIES TO FOLLOW    Report Status  PENDING  Incomplete  MRSA PCR Screening     Status: Abnormal   Collection Time: 02/05/17  6:03 AM  Result Value Ref Range Status   MRSA by PCR POSITIVE (A) NEGATIVE Final    Comment:        The GeneXpert MRSA Assay (FDA approved for NASAL specimens only), is one component of a comprehensive MRSA colonization surveillance program. It is not intended to diagnose MRSA infection nor to guide or monitor treatment for MRSA infections. RESULT CALLED TO, READ BACK BY AND VERIFIED WITH: S. CRADDOCK [id number] 10.04.2018 N. MORRIS     Cliffton Asters, MD [facility] [phone] pager   [phone] cell 02/07/2017, 12:52 PM

## 2017-02-07 NOTE — Consult Note (Signed)
Consultation Note Date: 02/07/2017   Patient Name: Dalton Ferguson  DOB: [birth year]  MRN: [id number]  Age / Sex: 66 y.o., male  PCP: Dalton Grout, MD Referring Physician: Elease Etienne, MD  Reason for Consultation: Establishing goals of care  HPI/Patient Profile: 66 y.o. 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  Patient has been admitted with diabetes with hypoglycemic episode, recent staphylococcal bacteremia, nonhealing wound and osteomyelitis, questionable nonfunctioning PICC line, vomiting.  Palliative consultation for goals of care discussions  Clinical Assessment and Goals of Care:  I arrived at the bedside and found that Dalton Ferguson had a large amount of emesis. There was vomit all over his floor, mouth, chest, face. He did not verbalize much. He was awake alert. He did not appear to be short of breath.  Call placed and discussed with daughter Dalton Ferguson who stated she is the healthcare power of attorney agent at [phone]. Daughter states that her and her sister Dalton Ferguson have been the patient's primary caregivers and have taken care of the patient at home ever since a stroke left him bedbound. They state that he developed a lesion on his heel a few months ago. This state that the only sent him to [facility] thinking that he needed some local debridement and that he would be brought home. They state that he has continued to decline since then and they're not able to understand why. They state that [facility]  Daughter states that the patient has not been getting tube feeds at appropriate weights. They desire full code. Treatment for the patient. I discussed about DO NOT RESUSCITATE/DO NOT INTUBATE in detail. Patient's daughters are looking for any scope of recovery status and stabilization. Goals are not palliative in nature at this point. I did initiate gentle discussions about the patient's anasarca and poor nutritional status, ongoing hypoalbuminemia along with underlying heart failure.  I discussed in detail with them about infectious disease recommendations, all of their questions answered to the best of my ability. Daughter states she is awaiting call back from hospital medicine service as well. See recommendations below. Thank you for the consult.  HCPOA  daughter Dalton Ferguson   SUMMARY OF RECOMMENDATIONS   Full code, full scope, as per my discussions with HCPOA daughter Dalton Ferguson on the phone [phone]   Code Status/Advance Care Planning:  Full code    Symptom Management:    continue current mode of care.   Palliative Prophylaxis:   Bowel Regimen  Additional Recommendations (Limitations, Scope, Preferences):  Full Scope Treatment  Psycho-social/Spiritual:   Desire for further Chaplaincy support:no  Additional Recommendations: Caregiving  Support/Resources  Prognosis:   Unable  to determine  Discharge Planning: To Be Determined      Primary Diagnoses: Present on Admission: . Pressure ulcer of right heel, stage 4 (HCC) . Hypoglycemia . Anasarca . Coagulase negative Staphylococcus bacteremia . Hypertension   I have reviewed the medical record, interviewed the patient and family, and examined the patient. The following aspects are pertinent.  Past Medical History:  Diagnosis Date  . Anemia   . Blindness of left eye   . CHF (congestive heart failure) (HCC)   . Depression   . Diabetes mellitus without complication (HCC)   .  Hypertension   . Seizures (HCC)   . Stroke [facility])    Social History   Social History  . Marital status: Single    Spouse name: N/A  . Number of children: N/A  . Years of education: N/A   Social History Main Topics  . Smoking status: Unknown If Ever Smoked  . Smokeless tobacco: None  . Alcohol use No  . Drug use: Unknown  . Sexual activity: Not Currently   Other Topics Concern  . None   Social History Narrative  . None   Family History  Problem Relation Age of Onset  . Stroke Mother   . Hypertension Father    Scheduled Meds: . apixaban  5 mg Per Tube BID  . atorvastatin  80 mg Oral QHS  . baclofen  10 mg Per Tube TID  . cholestyramine  4 g Per Tube 3 times per day  . feeding supplement (PRO-STAT SUGAR FREE 64)  30 mL Per Tube TID  . FLUoxetine  20 mg Per Tube Daily  . furosemide  40 mg Per Tube BID  . gabapentin  300 mg Per Tube BID  . hydrALAZINE  100 mg Per Tube Q8H  . isosorbide dinitrate  20 mg Per Tube TID  . levETIRAcetam  750 mg Per Tube BID  . losartan  25 mg Per Tube Daily  . metoprolol tartrate  50 mg Per Tube BID  . oxyCODONE-acetaminophen  0.5 tablet Per Tube BID   Continuous Infusions: . feeding supplement (GLUCERNA 1.2 CAL) 1,000 mL (02/07/17 1215)  . vancomycin Stopped (02/06/17 1724)   PRN Meds:.acetaminophen **OR** acetaminophen, ondansetron **OR** ondansetron (ZOFRAN) IV, sodium chloride flush Medications Prior to Admission:  Prior to Admission medications   Medication Sig Start Date End Date Taking? Authorizing Provider  apixaban (ELIQUIS) 5 MG TABS tablet Place 5 mg into feeding tube daily.   Yes [provider]  atorvastatin (LIPITOR) 80 MG tablet 80 mg by PEG Tube route at bedtime.   Yes [provider]  baclofen (LIORESAL) 10 MG tablet Place 10 mg into feeding tube 3 (three) times daily.    Yes [provider]  Cholecalciferol (VITAMIN D-3 PO) 3,000 Units by PEG Tube route daily.    Yes [provider]    cholestyramine (QUESTRAN) 4 g packet Place 4 g into feeding tube 3 (three) times daily.   Yes [provider]  FLUoxetine (PROZAC) 20 MG capsule Place 20 mg into feeding tube daily.   Yes [provider]  furosemide (LASIX) 40 MG tablet Place 40 mg into feeding tube daily.   Yes [provider]  gabapentin (NEURONTIN) 300 MG capsule Place 300 mg into feeding tube 2 (two) times daily.   Yes [provider]  hydrALAZINE (APRESOLINE) 100 MG tablet Place 100 mg into feeding tube every 8 (eight) hours.   Yes [provider]  Insulin Detemir (LEVEMIR FLEXTOUCH)  100 UNIT/ML Pen Inject 30 Units into the skin daily. Give at [facility]   Yes [provider]  isosorbide dinitrate (ISORDIL) 20 MG tablet Place 20 mg into feeding tube 3 (three) times daily.   Yes [provider]  LACTOBACILLUS PO 1 tablet by PEG Tube route 2 (two) times daily.    Yes [provider]  levETIRAcetam (KEPPRA) 100 MG/ML solution Place 750 mg into feeding tube 2 (two) times daily.   Yes [provider]  losartan (COZAAR) 25 MG tablet Place 25 mg into feeding tube daily.   Yes [provider]  metoprolol tartrate (LOPRESSOR) 50 MG tablet Place 50 mg into feeding tube 2 (two) times daily.   Yes [provider]  ondansetron (ZOFRAN) 4 MG tablet Place 4 mg into feeding tube every 8 (eight) hours as needed for nausea or vomiting.   Yes [provider]  oxyCODONE-acetaminophen (PERCOCET/ROXICET) 5-325 MG tablet Place 0.5 tablets into feeding tube 2 (two) times daily.   Yes [provider]   No Known Allergies Review of Systems Non verbal  Physical Exam Frail elderly appearing gentleman appears chronically ill Patient has had large amounts of emesis 8 is all over his face, chest and also on the floor. He does not appear to have increased work of breathing Regular Has PEG tube tube feeds were ongoing they have been stopped now  because of his vomiting Has diffuse edema and contractures Left lower extremity dressing  Vital Signs: BP (!) 152/76 (BP Location: Right Arm) Comment (BP Location): forearm  Pulse 73   Temp 98.3 F (36.8 C) (Axillary)   Resp 18   Wt 104.8 kg (231 lb)   SpO2 100%   BMI 31.33 kg/m  Pain Assessment: No/denies pain      SpO2: SpO2: 100 % O2 Device:SpO2: 100 % O2 Flow Rate: .  PPS 20% IO: Intake/output summary:  Intake/Output Summary (Last 24 hours) at 02/07/17 1436 Last data filed at 02/07/17 0539  Gross per 24 hour  Intake               10 ml  Output              950 ml  Net             -940 ml    LBM: Last BM Date: 02/05/17 Baseline Weight: Weight: 104.8 kg (231 lb) Most recent weight: Weight: 104.8 kg (231 lb)     Palliative Assessment/Data:     Time In:  1300 Time Out:  1410 Time Total:  70 min  Greater than 50%  of this time was spent counseling and coordinating care related to the above assessment and plan.  Signed by: Rosalin Hawking, MD [phone]   Please contact Palliative Medicine Team phone at [phone] for questions and concerns.  For individual provider: See Loretha Stapler

## 2017-02-07 NOTE — Progress Notes (Signed)
PROGRESS NOTE    Dalton Ferguson  YQM:[id number] DOB: [birth year] DOA: 02/04/2017 PCP: Oneal Grout, MD   Outpatient Specialists:     Brief Narrative:  Dalton Ferguson is a 66 y.o. male with medical history significant hypertension, diabetes, stroke with hemiplegia, seizure disorder, congestive heart failure, blindness in left eye, chronic anemia, nonhealing wound status post debridement at [facility], osteomyelitis presents to the emergency Department chief complaint of hypoglycemia anasarca and nonfunctional PICC line. So evaluation reveals a serum glucose of 50. [facility] asked to admit     Assessment & Plan:   Principal Problem:   Hypoglycemia Active Problems:   History of CVA (cerebrovascular accident)   Seizures (HCC)   Diabetes mellitus type 2, insulin dependent (HCC)   Pressure ulcer of right heel, stage 4 (HCC)   Coagulase negative Staphylococcus bacteremia   Anasarca   Hypertension   Ileus (HCC)   Acute osteomyelitis of right calcaneus (HCC)   Encounter for palliative care   Goals of care, counseling/discussion   Diabetes with Hypoglycemia.  -Etiology uncertain -Holding long-acting insulin -hemoglobin A1c: 5.3 - Resumed and tolerating tube feeds. No further hypoglycemic episodes since early morning 10/4. DC'ed D5 infusion and monitor closely.  Staphylococcal bacteremia related to non-healing wound and osteo.  -  IV team cleared PICC line use while in ED. - As per outpatient records, patient was supposed to complete course of IV vancomycin and ciprofloxacin on 10/4 which he completed. - Repeat blood cultures from 02/04/17: blood culture's shows coag is negative staph. Vancomycin was continued and repeat blood cultures were drawn 10/6. ID was consulted. As per ID follow-up 10/6, admission blood cultures showed different species of coagulase-negative staph which suggested that both are contaminants rather than true bacteremia. They recommended  stopping vancomycin, removing PICC line (will do prior to discharge due to difficult stick) and ID signed off.  Vomiting - KUB without acute findings. Had been tolerating tube feeds until sometime this morning when again had an episode of emesis. Closely monitor and resume tube feeds when able.  Pressure ulcer of right heel stage IV/osteomyelitis.  -patient discharged from [facility] 01/28/2017  -Prior to that he was at [facility] for completion of IV antibiotics for wound care and nutritional support since September 7.  -Wound care consult 10/4 appreciated. Multiple wounds. Prior sacral stage IV pressure injury has healed. Still has right heel chronic stage IV pressure injury. Management per wound care team. - pulses found with doppler to left foot  Anasarca. Worsening. Seen by PCP recently who started a Lasix trial. -Lasix po daily. Likely multifactorial related to poor nutritional status, hypoalbuminemia and chronic systolic CHF. Increased Lasix to 40 mg twice a day.  -Nutritional consult -pre-albumin 23.2 -Continue tube feeding -1.7 L since admission. Continue diuretics.  History of CVA. Chart review indicates patient at 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 That time he had a right lower extremity DVT. Stroke was thought to be cardioembolic in origin and anticoagulation was initiated.   Patient is aphasic but nods head to few questions. Contractures and hemiplegia on the right side. Also seems paraplegic.  Chronic systolic CHF. Chest xray with Mild left basilar subsegmental atelectasis is noted. Right upper lobe opacity is noted concerning for possible pneumonia. Afebrile, no leukocytosis non-toxic appearing. Lactic acid within limits of normal. Oxygen saturation level greater than 90% on room air. Chart review indicates ejection fraction 30-35% 2 years ago. Care everywhere  indicates echo September 2018  EF 55%, mild apical hypokinesis, trace tricuspid regurg, mild left atrial enlargement home medications include Lasix,isordil, cozaar, metoprolol. - Increased Lasix and follow.   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   Adult failure to thrive  - Multifactorial. Discussed with palliative care and their input appreciated. At this time, family wishes to pursue aggressive care.  Anemia     likely due to chronic disease. Baseline hemoglobin probably in the 8-9 gram range. This has fluctuated over the last 24 hours and down to 7.5. No overt bleeding reported. Follow CBC in a.m. May consider transfusion if hemoglobin <7 if family wishes to pursue aggressive treatment. Hemoglobin remained stable at 7.5.  DVT prophylaxis: Fully anticoagulated on Eliquis Code Status:Full Code Family Communication: Discussed in detail with patients daughter/HCPOA in detail, updated care and answered questions. Disposition Plan:  Patient's daughter expresses desire to take patient home and not to a skilled nursing facility.    Consultants:    ID  Palliative care     Subjective: Patient seen this morning. Trying to say something but unable to understand.  Objective: Vitals:   02/06/17 2032 02/07/17 0539 02/07/17 1048 02/07/17 1156  BP: (!) 141/48 (!) 149/52  (!) 152/76  Pulse: 75   73  Resp: 18     Temp: 98.6 F (37 C) 98.3 F (36.8 C)  98.3 F (36.8 C)  TempSrc: Oral Axillary  Axillary  SpO2: 100% 100%    Weight:   104.8 kg (231 lb)     Intake/Output Summary (Last 24 hours) at 02/07/17 1659 Last data filed at 02/07/17 1500  Gross per 24 hour  Intake              730 ml  Output             1100 ml  Net             -370 ml    Examination:  General exam: Middle-aged male, small built and frail, chronically ill-looking lying comfortably  propped up in bed.  Respiratory system: clear to auscultation. No increased work of breathing.  Cardiovascular system: S1 and S2 heard, RRR. No JVD or murmurs. 3+ pitting edema of all extremities but mostly of lower extremities. No significant change in edema. Gastrointestinal system: nondistended, soft and nontender. Normal bowel sounds heard. PEG tube intact without acute findings.  Central nervous system: alert and seems oriented, nods to simple questions. Aphasic/nonverbal. Left eye closed, appears chronic. No change and appears stable. Extremities: 3+ pitting edema of all extremities but more so of legs. Right upper extremity contractures. Some movement of left upper extremity but 0 x 5 movement in lower extremity.  Skin: Left heel dressing clean and dry. Mid sacral area with stage I wound which appears mostly as macerated skin but no clear open wound. Right heel stage IV chronic ulcer without acute findings.    Data Reviewed: I have personally reviewed following labs and imaging studies  CBC:  Recent Labs Lab 02/04/17 1255 02/05/17 0536 02/05/17 1157 02/06/17 0421 02/07/17 0326  WBC 7.4 6.0  --  6.9 6.4  NEUTROABS 4.5  --   --   --   --   HGB 8.8* 7.7* 8.4* 7.5* 7.5*  HCT 28.2* 23.6* 26.6* 23.4* 23.6*  MCV 93.4 92.9  --  94.0 94.4  PLT 235 189  --  200 197   Basic Metabolic Panel:  Recent Labs Lab 02/04/17 1255 02/05/17 0536 02/06/17 0421 02/07/17 0326  NA 136 139 139 137  K 3.7 4.1 3.7 3.8  CL 107 107 107 108  CO2 24 24 25 25   GLUCOSE 50* 62* 150* 141*  BUN 21* 19 17 14   CREATININE 0.83 0.87 1.01 0.95  CALCIUM 8.2* 8.3* 7.9* 7.9*   GFR: Estimated Creatinine Clearance: 94.5 mL/min (by C-G formula based on SCr of 0.95 mg/dL). Liver Function Tests:  Recent Labs Lab 02/04/17 1255 02/04/17 2116  AST 33  --   ALT 22  --   ALKPHOS 103  --   BILITOT 0.4  --   PROT 5.7*  --   ALBUMIN 2.0* 2.0*   HbA1C: No results for input(s): HGBA1C in the last 72  hours. CBG:  Recent Labs Lab 02/06/17 1200 02/06/17 1605 02/06/17 2032 02/07/17 0023 02/07/17 0626  GLUCAP 184* 152* 139* 136* 130*   Urine analysis:    Component Value Date/Time   COLORURINE STRAW (A) 02/04/2017 2316   APPEARANCEUR CLEAR 02/04/2017 2316   LABSPEC 1.008 02/04/2017 2316   PHURINE 7.0 02/04/2017 2316   GLUCOSEU NEGATIVE 02/04/2017 2316   HGBUR NEGATIVE 02/04/2017 2316   BILIRUBINUR NEGATIVE 02/04/2017 2316   KETONESUR NEGATIVE 02/04/2017 2316   PROTEINUR 100 (A) 02/04/2017 2316   NITRITE NEGATIVE 02/04/2017 2316   LEUKOCYTESUR NEGATIVE 02/04/2017 2316     ) Recent Results (from the past 240 hour(s))  Blood culture (routine x 2)     Status: Abnormal (Preliminary result)   Collection Time: 02/04/17  1:00 PM  Result Value Ref Range Status   Specimen Description BLOOD RIGHT ANTECUBITAL  Final   Special Requests   Final    BOTTLES DRAWN AEROBIC ONLY Blood Culture adequate volume   Culture  Setup Time   Final    GRAM POSITIVE COCCI AEROBIC BOTTLE ONLY CRITICAL RESULT CALLED TO, READ BACK BY AND VERIFIED WITH: PHARMD N BATCHELDER [phone] MLM    Culture STAPHYLOCOCCUS SPECIES (COAGULASE NEGATIVE) (A)  Final   Report Status PENDING  Incomplete  Blood Culture ID Panel (Reflexed)     Status: None   Collection Time: 02/04/17  1:00 PM  Result Value Ref Range Status   Enterococcus species NOT DETECTED NOT DETECTED Final   Listeria monocytogenes NOT DETECTED NOT DETECTED Final   Staphylococcus species NOT DETECTED NOT DETECTED Final   Staphylococcus aureus NOT DETECTED NOT DETECTED Final   Streptococcus species NOT DETECTED NOT DETECTED Final   Streptococcus agalactiae NOT DETECTED NOT DETECTED Final   Streptococcus pneumoniae NOT DETECTED NOT DETECTED Final   Streptococcus pyogenes NOT DETECTED NOT DETECTED Final   Acinetobacter baumannii NOT DETECTED NOT DETECTED Final   Enterobacteriaceae species NOT DETECTED NOT DETECTED Final   Enterobacter cloacae  complex NOT DETECTED NOT DETECTED Final   Escherichia coli NOT DETECTED NOT DETECTED Final   Klebsiella oxytoca NOT DETECTED NOT DETECTED Final   Klebsiella pneumoniae NOT DETECTED NOT DETECTED Final   Proteus species NOT DETECTED NOT DETECTED Final   Serratia marcescens NOT DETECTED NOT DETECTED Final   Haemophilus influenzae NOT DETECTED NOT DETECTED Final   Neisseria meningitidis NOT DETECTED NOT DETECTED Final   Pseudomonas aeruginosa NOT DETECTED NOT DETECTED Final   Candida albicans NOT DETECTED NOT DETECTED Final   Candida glabrata NOT DETECTED NOT DETECTED Final   Candida krusei NOT DETECTED  NOT DETECTED Final   Candida parapsilosis NOT DETECTED NOT DETECTED Final   Candida tropicalis NOT DETECTED NOT DETECTED Final  Blood culture (routine x 2)     Status: Abnormal (Preliminary result)   Collection Time: 02/04/17  8:05 PM  Result Value Ref Range Status   Specimen Description BLOOD BLOOD LEFT HAND  Final   Special Requests IN PEDIATRIC BOTTLE Blood Culture adequate volume  Final   Culture  Setup Time   Final    GRAM POSITIVE COCCI IN CLUSTERS IN PEDIATRIC BOTTLE CRITICAL RESULT CALLED TO, READ BACK BY AND VERIFIED WITH: Lytle Butte PHARMD 2231 02/05/17 A BROWNING    Culture (A)  Final    STAPHYLOCOCCUS SPECIES (COAGULASE NEGATIVE) SUSCEPTIBILITIES TO FOLLOW    Report Status PENDING  Incomplete  MRSA PCR Screening     Status: Abnormal   Collection Time: 02/05/17  6:03 AM  Result Value Ref Range Status   MRSA by PCR POSITIVE (A) NEGATIVE Final    Comment:        The GeneXpert MRSA Assay (FDA approved for NASAL specimens only), is one component of a comprehensive MRSA colonization surveillance program. It is not intended to diagnose MRSA infection nor to guide or monitor treatment for MRSA infections. RESULT CALLED TO, READ BACK BY AND VERIFIED WITH: S. CRADDOCK [id number] 10.04.2018 N. MORRIS   Culture, blood (Routine X 2) w Reflex to ID Panel     Status: None (Preliminary  result)   Collection Time: 02/06/17  6:35 PM  Result Value Ref Range Status   Specimen Description BLOOD LEFT HAND  Final   Special Requests   Final    BOTTLES DRAWN AEROBIC AND ANAEROBIC Blood Culture adequate volume   Culture NO GROWTH < 24 HOURS  Final   Report Status PENDING  Incomplete  Culture, blood (Routine X 2) w Reflex to ID Panel     Status: None (Preliminary result)   Collection Time: 02/06/17  6:48 PM  Result Value Ref Range Status   Specimen Description BLOOD RIGHT HAND  Final   Special Requests IN PEDIATRIC BOTTLE Blood Culture adequate volume  Final   Culture NO GROWTH < 24 HOURS  Final   Report Status PENDING  Incomplete      Anti-infectives    Start     Dose/Rate Route Frequency Ordered Stop   02/06/17 1200  vancomycin (VANCOCIN) IVPB 1000 mg/200 mL premix     1,000 mg 200 mL/hr over 60 Minutes Intravenous Every 24 hours 02/06/17 1046     02/04/17 2100  ciprofloxacin (CIPRO) tablet 500 mg     500 mg Per Tube Every 12 hours 02/04/17 1911 02/05/17 2125   02/04/17 2000  ciprofloxacin (CIPRO) tablet 500 mg  Status:  Discontinued     500 mg Per Tube 2 times daily 02/04/17 1617 02/04/17 1911   02/04/17 1730  vancomycin (VANCOCIN) IVPB 750 mg/150 ml premix     750 mg 150 mL/hr over 60 Minutes Intravenous Every 18 hours 02/04/17 1709 02/05/17 1232       Radiology Studies: Dg Abd Portable 1v  Result Date: 02/05/2017 CLINICAL DATA:  Possible ileus. Unknown hx concerning bowel movements and emesis.Hx of DM, CHF, stroke. EXAM: PORTABLE ABDOMEN - 1 VIEW COMPARISON:  01/02/2017 FINDINGS: There is no bowel dilation to suggest obstruction or generalized adynamic ileus. Gastrostomy tube projects in the mid stomach, stable. No evidence of renal or ureteral stones. Stable vascular calcifications are noted along the aorta and iliac and femoral vessels. No  acute skeletal abnormality. IMPRESSION: 1. No evidence of bowel obstruction or generalized adynamic ileus. No acute findings.  Electronically Signed   By: Amie Portland M.D.   On: 02/05/2017 19:04        Scheduled Meds: . apixaban  5 mg Per Tube BID  . atorvastatin  80 mg Oral QHS  . baclofen  10 mg Per Tube TID  . cholestyramine  4 g Per Tube 3 times per day  . feeding supplement (PRO-STAT SUGAR FREE 64)  30 mL Per Tube TID  . FLUoxetine  20 mg Per Tube Daily  . furosemide  40 mg Per Tube BID  . gabapentin  300 mg Per Tube BID  . hydrALAZINE  100 mg Per Tube Q8H  . isosorbide dinitrate  20 mg Per Tube TID  . levETIRAcetam  750 mg Per Tube BID  . losartan  25 mg Per Tube Daily  . mouth rinse  15 mL Mouth Rinse BID  . metoprolol tartrate  50 mg Per Tube BID  . oxyCODONE-acetaminophen  0.5 tablet Per Tube BID   Continuous Infusions: . feeding supplement (GLUCERNA 1.2 CAL) 1,000 mL (02/07/17 1500)  . vancomycin Stopped (02/06/17 1724)     LOS: 2 days    Time spent: 35 min    HONGALGI,ANAND, MD, FACP, FHM. [facility] Pager [phone]  If 7PM-7AM, please contact night-coverage [email or web address] Password [email or web address] 02/07/2017, 4:59 PM

## 2017-02-07 NOTE — Progress Notes (Signed)
Upon assessment patient was noted to have vomited a large amount of his tube feedings all over himself and the ground. Patient was cleaned and mouth care was provided. Patient said "thank you" when we were done. Palliative MD saw patient in this condition and said she will call the daughter for further planning.

## 2017-02-08 LAB — BASIC METABOLIC PANEL
Anion gap: 4 — ABNORMAL LOW (ref 5–15)
BUN: 13 mg/dL (ref 6–20)
CO2: 26 mmol/L (ref 22–32)
CREATININE: 0.81 mg/dL (ref 0.61–1.24)
Calcium: 8 mg/dL — ABNORMAL LOW (ref 8.9–10.3)
Chloride: 109 mmol/L (ref 101–111)
GFR calc Af Amer: >60 mL/min (ref >60)
Glucose, Bld: 129 mg/dL — ABNORMAL HIGH (ref 65–99)
Potassium: 3.6 mmol/L (ref 3.5–5.1)
SODIUM: 139 mmol/L (ref 135–145)

## 2017-02-08 LAB — CBC
HCT: 24 % — ABNORMAL LOW (ref 39.0–52.0)
Hemoglobin: 7.5 g/dL — ABNORMAL LOW (ref 13.0–17.0)
MCH: 29.2 pg (ref 26.0–34.0)
MCHC: 31.3 g/dL (ref 30.0–36.0)
MCV: 93.4 fL (ref 78.0–100.0)
PLATELETS: 198 10*3/uL (ref 150–400)
RBC: 2.57 MIL/uL — ABNORMAL LOW (ref 4.22–5.81)
RDW: 14.4 % (ref 11.5–15.5)
WBC: 6.6 10*3/uL (ref 4.0–10.5)

## 2017-02-08 LAB — GLUCOSE, CAPILLARY
GLUCOSE-CAPILLARY: 126 mg/dL — AB (ref 65–99)
GLUCOSE-CAPILLARY: 147 mg/dL — AB (ref 65–99)
GLUCOSE-CAPILLARY: 142 mg/dL — AB (ref 65–99)
Glucose-Capillary: 112 mg/dL — ABNORMAL HIGH (ref 65–99)
Glucose-Capillary: 129 mg/dL — ABNORMAL HIGH (ref 65–99)
Glucose-Capillary: 108 mg/dL — ABNORMAL HIGH (ref 65–99)

## 2017-02-08 MED ORDER — FUROSEMIDE 10 MG/ML IJ SOLN
40.0000 mg | Freq: Two times a day (BID) | INTRAMUSCULAR | Status: DC
  Administered 2017-02-08 – 2017-02-11 (×6): 40 mg via INTRAVENOUS
  Filled 2017-02-08 (×6): qty 4

## 2017-02-08 NOTE — Progress Notes (Signed)
Returned call to paatilent's daughter silvia who was calling in for an update on her dad [phone]

## 2017-02-08 NOTE — Progress Notes (Signed)
PHARMACY - PHYSICIAN COMMUNICATION CRITICAL VALUE ALERT - BLOOD CULTURE IDENTIFICATION (BCID)  Results for orders placed or performed during the hospital encounter of 02/04/17  Blood Culture ID Panel (Reflexed) (Collected: 02/04/2017  1:00 PM)  Result Value Ref Range   Enterococcus species NOT DETECTED NOT DETECTED   Listeria monocytogenes NOT DETECTED NOT DETECTED   Staphylococcus species NOT DETECTED NOT DETECTED   Staphylococcus aureus NOT DETECTED NOT DETECTED   Streptococcus species NOT DETECTED NOT DETECTED   Streptococcus agalactiae NOT DETECTED NOT DETECTED   Streptococcus pneumoniae NOT DETECTED NOT DETECTED   Streptococcus pyogenes NOT DETECTED NOT DETECTED   Acinetobacter baumannii NOT DETECTED NOT DETECTED   Enterobacteriaceae species NOT DETECTED NOT DETECTED   Enterobacter cloacae complex NOT DETECTED NOT DETECTED   Escherichia coli NOT DETECTED NOT DETECTED   Klebsiella oxytoca NOT DETECTED NOT DETECTED   Klebsiella pneumoniae NOT DETECTED NOT DETECTED   Proteus species NOT DETECTED NOT DETECTED   Serratia marcescens NOT DETECTED NOT DETECTED   Haemophilus influenzae NOT DETECTED NOT DETECTED   Neisseria meningitidis NOT DETECTED NOT DETECTED   Pseudomonas aeruginosa NOT DETECTED NOT DETECTED   Candida albicans NOT DETECTED NOT DETECTED   Candida glabrata NOT DETECTED NOT DETECTED   Candida krusei NOT DETECTED NOT DETECTED   Candida parapsilosis NOT DETECTED NOT DETECTED   Candida tropicalis NOT DETECTED NOT DETECTED    Name of physician (or Provider) Contacted: TOpyd  Changes to prescribed antibiotics required: BCID invalid x2, running full cx, last cx was coag-neg staph, likely same, no need to add ABX unless change in clinical picture.  Vernard Gambles, PharmD, BCPS  02/08/2017  5:48 AM

## 2017-02-08 NOTE — Progress Notes (Signed)
RN informed MD that patient has vomited. Nursing will continue to monitor.

## 2017-02-08 NOTE — Progress Notes (Signed)
PROGRESS NOTE    Dalton Ferguson  ZOX:[id number] DOB: 1950-10-25 DOA: 02/04/2017 PCP: Oneal Grout, MD   Outpatient Specialists:     Brief Narrative:  Dalton Ferguson is a 66 y.o. male with medical history significant hypertension, diabetes, stroke with hemiplegia, seizure disorder, congestive heart failure, blindness in left eye, chronic anemia, nonhealing wound status post debridement at [facility], osteomyelitis presents to the emergency Department chief complaint of hypoglycemia anasarca and nonfunctional PICC line. So evaluation reveals a serum glucose of 50. [facility] asked to admit     Assessment & Plan:   Principal Problem:   Hypoglycemia Active Problems:   History of CVA (cerebrovascular accident)   Seizures (HCC)   Diabetes mellitus type 2, insulin dependent (HCC)   Pressure ulcer of right heel, stage 4 (HCC)   Coagulase negative Staphylococcus bacteremia   Anasarca   Hypertension   Ileus (HCC)   Acute osteomyelitis of right calcaneus (HCC)   Encounter for palliative care   Goals of care, counseling/discussion   Diabetes with Hypoglycemia.  -Etiology uncertain -Holding long-acting insulin -hemoglobin A1c: 5.3 - Resumed and tolerating tube feeds. Resolved.  Staphylococcal bacteremia related to non-healing wound and osteo.  -  IV team cleared PICC line use while in ED. - As per outpatient records, patient was supposed to complete course of IV vancomycin and ciprofloxacin on 10/4 which he completed. - Repeat blood cultures from 02/04/17: blood culture's shows coag is negative staph. Vancomycin was continued and repeat blood cultures were drawn 10/6. ID was consulted. As per ID follow-up 10/6, admission blood cultures showed 2 different species of coagulase-negative staph which suggested that both are contaminants rather than true bacteremia. They recommended stopping vancomycin, removing PICC line (will do prior to discharge due to difficult stick)  and ID signed off. - 1 of 2 blood cultures from 10/6 shows gram-positive cocci in clusters, follow final cultures, possibly contaminant.  Vomiting - KUB without acute findings. Mostly tolerating tube feeds. Had an episode of vomiting on 10/6. Tube feeds currently at 60 mL per hour. Strict aspiration precautions.  Pressure ulcer of right heel stage IV/osteomyelitis.  -patient discharged from [facility] 01/28/2017  -Prior to that he was at [facility] for completion of IV antibiotics for wound care and nutritional support since September 7.  -Wound care consult 10/4 appreciated. Multiple wounds. Prior sacral stage IV pressure injury has healed. Still has right heel chronic stage IV pressure injury. Management per wound care team. - pulses found with doppler to left foot  Anasarca. Worsening. Seen by PCP recently who started a Lasix trial. - Likely multifactorial related to poor nutritional status, hypoalbuminemia and chronic systolic CHF.  -Nutritional consult -pre-albumin 23.2 -Continue tube feeding - despite increasing dose of oral/tube Lasix, persisting significant edema without significant improvement. Changed Lasix to 40 mg IV every 12 hours. Follow BMP closely.  History of CVA. 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 That time he had a right lower extremity DVT. Stroke was thought to be cardioembolic in origin and anticoagulation was initiated.   Patient is aphasic but nods head to few questions. Contractures and hemiplegia on the right side. Also seems paraplegic.  Chronic systolic CHF. Chest xray with Mild left basilar subsegmental atelectasis is noted. Right upper lobe opacity is noted concerning for possible pneumonia. Afebrile, no leukocytosis non-toxic appearing. Lactic acid within limits of normal. Oxygen saturation level greater than 90% on  room air. Chart review  indicates ejection fraction 30-35% 2 years ago. Care everywhere indicates echo September 2018 EF 55%, mild apical hypokinesis, trace tricuspid regurg, mild left atrial enlargement home medications include Lasix,isordil, cozaar, metoprolol. - Changed to IV Lasix as above.  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   Adult failure to thrive  - Multifactorial. Discussed with palliative care and their input appreciated. At this time, family wishes to pursue aggressive care.  Anemia     likely due to chronic disease. Baseline hemoglobin probably in the 8-9 gram range. This has fluctuated over the last 24 hours and down to 7.5. No overt bleeding reported. Follow CBC in a.m. May consider transfusion if hemoglobin <7. Hemoglobin remained stable at 7.5.  DVT prophylaxis: Fully anticoagulated on Eliquis Code Status:Full Code Family Communication: Discussed in detail with patients daughter/HCPOA in detail on 10/6. None at bedside today. Disposition Plan:  Patient's daughter expresses desire to take patient home and not to a skilled nursing facility.    Consultants:    ID  Palliative care     Subjective: Patient is nonverbal. Does not appear to be in any distress.  Objective: Vitals:   02/07/17 1156 02/07/17 1700 02/07/17 1933 02/08/17 0501  BP: (!) 152/76 (!) 161/70 (!) 145/65 (!) 148/66  Pulse: 73 77 84 77  Resp:      Temp: 98.3 F (36.8 C) (!) 97.5 F (36.4 C) 98.1 F (36.7 C) 98.4 F (36.9 C)  TempSrc: Axillary Oral Oral Axillary  SpO2:   96% 99%  Weight:        Intake/Output Summary (Last 24 hours) at 02/08/17 1438 Last data filed at 02/08/17 1200  Gross per 24 hour  Intake             1090 ml  Output             1550 ml  Net             -460 ml    Examination:  General exam: Middle-aged male, small built and frail,  chronically ill-looking lying comfortably propped up in bed.  Respiratory system: clear to auscultation. No increased work of breathing.  Cardiovascular system: S1 and S2 heard, RRR. No JVD or murmurs. 3+ pitting edema of all extremities but mostly of lower extremities. No significant change in edema. Gastrointestinal system: nondistended, soft and nontender. Normal bowel sounds heard. PEG tube intact without acute findings.  Central nervous system: alert and seems oriented, nods to simple questions. Aphasic/nonverbal. Left eye closed, appears chronic. No change and appears stable. Extremities: 3+ pitting edema of all extremities but more so of legs. Right upper extremity contractures. Some movement of left upper extremity but 0 x 5 movement in lower extremity.  Skin: Left heel dressing clean and dry. Mid sacral area with stage I wound which appears mostly as macerated skin but no clear open wound. Right heel stage IV chronic ulcer without acute findings.    Data Reviewed: I have personally reviewed following labs and imaging studies  CBC:  Recent Labs Lab 02/04/17 1255 02/05/17 0536 02/05/17 1157 02/06/17 0421 02/07/17 0326 02/08/17 0337  WBC 7.4 6.0  --  6.9 6.4 6.6  NEUTROABS 4.5  --   --   --   --   --  HGB 8.8* 7.7* 8.4* 7.5* 7.5* 7.5*  HCT 28.2* 23.6* 26.6* 23.4* 23.6* 24.0*  MCV 93.4 92.9  --  94.0 94.4 93.4  PLT 235 189  --  200 197 198   Basic Metabolic Panel:  Recent Labs Lab 02/04/17 1255 02/05/17 0536 02/06/17 0421 02/07/17 0326 02/08/17 0337  NA 136 139 139 137 139  K 3.7 4.1 3.7 3.8 3.6  CL 107 107 107 108 109  CO2 24 24 25 25 26   GLUCOSE 50* 62* 150* 141* 129*  BUN 21* 19 17 14 13   CREATININE 0.83 0.87 1.01 0.95 0.81  CALCIUM 8.2* 8.3* 7.9* 7.9* 8.0*   GFR: Estimated Creatinine Clearance: 110.8 mL/min (by C-G formula based on SCr of 0.81 mg/dL). Liver Function Tests:  Recent Labs Lab 02/04/17 1255 02/04/17 2116  AST 33  --   ALT 22  --     ALKPHOS 103  --   BILITOT 0.4  --   PROT 5.7*  --   ALBUMIN 2.0* 2.0*   HbA1C: No results for input(s): HGBA1C in the last 72 hours. CBG:  Recent Labs Lab 02/07/17 2006 02/08/17 0052 02/08/17 0503 02/08/17 0747 02/08/17 1131  GLUCAP 135* 112* 129* 126* 147*   Urine analysis:    Component Value Date/Time   COLORURINE STRAW (A) 02/04/2017 2316   APPEARANCEUR CLEAR 02/04/2017 2316   LABSPEC 1.008 02/04/2017 2316   PHURINE 7.0 02/04/2017 2316   GLUCOSEU NEGATIVE 02/04/2017 2316   HGBUR NEGATIVE 02/04/2017 2316   BILIRUBINUR NEGATIVE 02/04/2017 2316   KETONESUR NEGATIVE 02/04/2017 2316   PROTEINUR 100 (A) 02/04/2017 2316   NITRITE NEGATIVE 02/04/2017 2316   LEUKOCYTESUR NEGATIVE 02/04/2017 2316     ) Recent Results (from the past 240 hour(s))  Blood culture (routine x 2)     Status: Abnormal (Preliminary result)   Collection Time: 02/04/17  1:00 PM  Result Value Ref Range Status   Specimen Description BLOOD RIGHT ANTECUBITAL  Final   Special Requests   Final    BOTTLES DRAWN AEROBIC ONLY Blood Culture adequate volume   Culture  Setup Time   Final    GRAM POSITIVE COCCI AEROBIC BOTTLE ONLY CRITICAL RESULT CALLED TO, READ BACK BY AND VERIFIED WITH: PHARMD N BATCHELDER [phone] MLM    Culture (A)  Final    STAPHYLOCOCCUS SPECIES (COAGULASE NEGATIVE) THE SIGNIFICANCE OF ISOLATING THIS ORGANISM FROM A SINGLE SET OF BLOOD CULTURES WHEN MULTIPLE SETS ARE DRAWN IS UNCERTAIN. PLEASE NOTIFY THE MICROBIOLOGY DEPARTMENT WITHIN ONE WEEK IF SPECIATION AND SENSITIVITIES ARE REQUIRED.    Report Status PENDING  Incomplete  Blood Culture ID Panel (Reflexed)     Status: None   Collection Time: 02/04/17  1:00 PM  Result Value Ref Range Status   Enterococcus species NOT DETECTED NOT DETECTED Final   Listeria monocytogenes NOT DETECTED NOT DETECTED Final   Staphylococcus species NOT DETECTED NOT DETECTED Final   Staphylococcus aureus NOT DETECTED NOT DETECTED Final    Streptococcus species NOT DETECTED NOT DETECTED Final   Streptococcus agalactiae NOT DETECTED NOT DETECTED Final   Streptococcus pneumoniae NOT DETECTED NOT DETECTED Final   Streptococcus pyogenes NOT DETECTED NOT DETECTED Final   Acinetobacter baumannii NOT DETECTED NOT DETECTED Final   Enterobacteriaceae species NOT DETECTED NOT DETECTED Final   Enterobacter cloacae complex NOT DETECTED NOT DETECTED Final   Escherichia coli NOT DETECTED NOT DETECTED Final   Klebsiella oxytoca NOT DETECTED NOT DETECTED Final   Klebsiella pneumoniae NOT DETECTED NOT DETECTED Final  Proteus species NOT DETECTED NOT DETECTED Final   Serratia marcescens NOT DETECTED NOT DETECTED Final   Haemophilus influenzae NOT DETECTED NOT DETECTED Final   Neisseria meningitidis NOT DETECTED NOT DETECTED Final   Pseudomonas aeruginosa NOT DETECTED NOT DETECTED Final   Candida albicans NOT DETECTED NOT DETECTED Final   Candida glabrata NOT DETECTED NOT DETECTED Final   Candida krusei NOT DETECTED NOT DETECTED Final   Candida parapsilosis NOT DETECTED NOT DETECTED Final   Candida tropicalis NOT DETECTED NOT DETECTED Final  Blood culture (routine x 2)     Status: Abnormal   Collection Time: 02/04/17  8:05 PM  Result Value Ref Range Status   Specimen Description BLOOD BLOOD LEFT HAND  Final   Special Requests IN PEDIATRIC BOTTLE Blood Culture adequate volume  Final   Culture  Setup Time   Final    GRAM POSITIVE COCCI IN CLUSTERS IN PEDIATRIC BOTTLE CRITICAL RESULT CALLED TO, READ BACK BY AND VERIFIED WITH: Lytle Butte PHARMD 2231 02/05/17 A BROWNING    Culture (A)  Final    STAPHYLOCOCCUS EPIDERMIDIS THE SIGNIFICANCE OF ISOLATING THIS ORGANISM FROM A SINGLE SET OF BLOOD CULTURES WHEN MULTIPLE SETS ARE DRAWN IS UNCERTAIN. PLEASE NOTIFY THE MICROBIOLOGY DEPARTMENT WITHIN ONE WEEK IF SPECIATION AND SENSITIVITIES ARE REQUIRED.    Report Status 02/08/2017 FINAL  Final  MRSA PCR Screening     Status: Abnormal   Collection  Time: 02/05/17  6:03 AM  Result Value Ref Range Status   MRSA by PCR POSITIVE (A) NEGATIVE Final    Comment:        The GeneXpert MRSA Assay (FDA approved for NASAL specimens only), is one component of a comprehensive MRSA colonization surveillance program. It is not intended to diagnose MRSA infection nor to guide or monitor treatment for MRSA infections. RESULT CALLED TO, READ BACK BY AND VERIFIED WITH: S. CRADDOCK [id number] 10.04.2018 N. MORRIS   Culture, blood (Routine X 2) w Reflex to ID Panel     Status: None (Preliminary result)   Collection Time: 02/06/17  6:35 PM  Result Value Ref Range Status   Specimen Description BLOOD LEFT HAND  Final   Special Requests   Final    BOTTLES DRAWN AEROBIC AND ANAEROBIC Blood Culture adequate volume   Culture NO GROWTH < 24 HOURS  Final   Report Status PENDING  Incomplete  Culture, blood (Routine X 2) w Reflex to ID Panel     Status: None (Preliminary result)   Collection Time: 02/06/17  6:48 PM  Result Value Ref Range Status   Specimen Description BLOOD RIGHT HAND  Final   Special Requests IN PEDIATRIC BOTTLE Blood Culture adequate volume  Final   Culture  Setup Time   Final    GRAM POSITIVE COCCI IN CLUSTERS IN PEDIATRIC BOTTLE Organism ID to follow CRITICAL RESULT CALLED TO, READ BACK BY AND VERIFIED WITH: V.[email or web address],PHARMD AT 0542 ON 02/08/17 BY [email or web address]    Culture   Final    GRAM POSITIVE COCCI CULTURE REINCUBATED FOR BETTER GROWTH    Report Status PENDING  Incomplete      Anti-infectives    Start     Dose/Rate Route Frequency Ordered Stop   02/06/17 1200  vancomycin (VANCOCIN) IVPB 1000 mg/200 mL premix  Status:  Discontinued     1,000 mg 200 mL/hr over 60 Minutes Intravenous Every 24 hours 02/06/17 1046 02/07/17 1724   02/04/17 2100  ciprofloxacin (CIPRO) tablet 500 mg     500 mg Per Tube Every  12 hours 02/04/17 1911 02/05/17 2125   02/04/17 2000  ciprofloxacin (CIPRO) tablet 500 mg  Status:  Discontinued     500 mg Per Tube 2  times daily 02/04/17 1617 02/04/17 1911   02/04/17 1730  vancomycin (VANCOCIN) IVPB 750 mg/150 ml premix     750 mg 150 mL/hr over 60 Minutes Intravenous Every 18 hours 02/04/17 1709 02/05/17 1232       Radiology Studies: No results found.      Scheduled Meds: . apixaban  5 mg Per Tube BID  . atorvastatin  80 mg Oral QHS  . baclofen  10 mg Per Tube TID  . cholestyramine  4 g Per Tube 3 times per day  . feeding supplement (PRO-STAT SUGAR FREE 64)  30 mL Per Tube TID  . FLUoxetine  20 mg Per Tube Daily  . furosemide  40 mg Per Tube BID  . gabapentin  300 mg Per Tube BID  . hydrALAZINE  100 mg Per Tube Q8H  . isosorbide dinitrate  20 mg Per Tube TID  . levETIRAcetam  750 mg Per Tube BID  . losartan  25 mg Per Tube Daily  . mouth rinse  15 mL Mouth Rinse BID  . metoprolol tartrate  50 mg Per Tube BID  . oxyCODONE-acetaminophen  0.5 tablet Per Tube BID   Continuous Infusions: . feeding supplement (GLUCERNA 1.2 CAL) 1,000 mL (02/08/17 1200)     LOS: 3 days    Time spent: 35 min    Beldon Nowling, MD, FACP, FHM. [facility] Pager [phone]  If 7PM-7AM, please contact night-coverage [email or web address] Password [email or web address] 02/08/2017, 2:38 PM

## 2017-02-09 ENCOUNTER — Inpatient Hospital Stay (HOSPITAL_COMMUNITY): Payer: Medicare Other

## 2017-02-09 DIAGNOSIS — R1111 Vomiting without nausea: Secondary | ICD-10-CM

## 2017-02-09 LAB — BASIC METABOLIC PANEL
Anion gap: 6 (ref 5–15)
BUN: 14 mg/dL (ref 6–20)
CHLORIDE: 106 mmol/L (ref 101–111)
CO2: 28 mmol/L (ref 22–32)
CREATININE: 0.79 mg/dL (ref 0.61–1.24)
Calcium: 8.2 mg/dL — ABNORMAL LOW (ref 8.9–10.3)
GFR calc Af Amer: >60 mL/min (ref >60)
GFR calc non Af Amer: >60 mL/min (ref >60)
GLUCOSE: 133 mg/dL — AB (ref 65–99)
POTASSIUM: 3.7 mmol/L (ref 3.5–5.1)
Sodium: 140 mmol/L (ref 135–145)

## 2017-02-09 LAB — GLUCOSE, CAPILLARY
Glucose-Capillary: 104 mg/dL — ABNORMAL HIGH (ref 65–99)
Glucose-Capillary: 116 mg/dL — ABNORMAL HIGH (ref 65–99)
Glucose-Capillary: 119 mg/dL — ABNORMAL HIGH (ref 65–99)
Glucose-Capillary: 141 mg/dL — ABNORMAL HIGH (ref 65–99)
Glucose-Capillary: 145 mg/dL — ABNORMAL HIGH (ref 65–99)

## 2017-02-09 MED ORDER — PANTOPRAZOLE SODIUM 40 MG PO PACK
40.0000 mg | PACK | Freq: Every day | ORAL | Status: DC
  Administered 2017-02-09 – 2017-02-11 (×3): 40 mg
  Filled 2017-02-09 (×3): qty 20

## 2017-02-09 MED ORDER — MUPIROCIN 2 % EX OINT
1.0000 "application " | TOPICAL_OINTMENT | Freq: Two times a day (BID) | CUTANEOUS | Status: DC
  Administered 2017-02-09 – 2017-02-11 (×4): 1 via NASAL
  Filled 2017-02-09 (×2): qty 22

## 2017-02-09 MED ORDER — CHLORHEXIDINE GLUCONATE CLOTH 2 % EX PADS
6.0000 | MEDICATED_PAD | Freq: Every day | CUTANEOUS | Status: DC
  Administered 2017-02-11: 6 via TOPICAL

## 2017-02-09 MED ORDER — DEXTROSE 5 % IV SOLN
INTRAVENOUS | Status: DC
  Administered 2017-02-09: 17:00:00 via INTRAVENOUS

## 2017-02-09 MED ORDER — VITAL 1.5 CAL PO LIQD
1000.0000 mL | ORAL | Status: DC
  Administered 2017-02-09: 1000 mL
  Filled 2017-02-09 (×5): qty 1000

## 2017-02-09 MED ORDER — PRO-STAT SUGAR FREE PO LIQD
30.0000 mL | Freq: Two times a day (BID) | ORAL | Status: DC
  Administered 2017-02-10 – 2017-02-11 (×3): 30 mL
  Filled 2017-02-09 (×3): qty 30

## 2017-02-09 NOTE — Progress Notes (Signed)
PROGRESS NOTE    Dalton Ferguson  GUR:[id number] DOB: Jan 20, 1951 DOA: 02/04/2017 PCP: Oneal Grout, MD   Outpatient Specialists:     Brief Narrative:  Dalton Ferguson is a 66 y.o. male with medical history significant hypertension, diabetes, stroke with hemiplegia, seizure disorder, congestive heart failure, blindness in left eye, chronic anemia, nonhealing wound status post debridement at [facility], osteomyelitis presents to the emergency Department chief complaint of hypoglycemia anasarca and nonfunctional PICC line. So evaluation reveals a serum glucose of 50. [facility] asked to admit     Assessment & Plan:   Principal Problem:   Hypoglycemia Active Problems:   History of CVA (cerebrovascular accident)   Seizures (HCC)   Diabetes mellitus type 2, insulin dependent (HCC)   Pressure ulcer of right heel, stage 4 (HCC)   Coagulase negative Staphylococcus bacteremia   Anasarca   Hypertension   Ileus (HCC)   Acute osteomyelitis of right calcaneus (HCC)   Encounter for palliative care   Goals of care, counseling/discussion   Diabetes with Hypoglycemia.  -Etiology uncertain -Holding long-acting insulin -hemoglobin A1c: 5.3 - Patient again vomited last night, tube feeds were held and patient apparently declining resumption of tube feeds. Discussed with RN and monitoring CBGs closely. Will start low-dose D5 W until tube feeds are resumed.  Staphylococcal bacteremia related to non-healing wound and osteo.  -  IV team cleared PICC line use while in ED. - As per outpatient records, patient was supposed to complete course of IV vancomycin and ciprofloxacin on 10/4 which he completed. - Repeat blood cultures from 02/04/17: blood culture's shows coag is negative staph. Vancomycin was continued and repeat blood cultures were drawn 10/6. ID was consulted. As per ID follow-up 10/6, admission blood cultures showed 2 different species of coagulase-negative staph which  suggested that both are contaminants rather than true bacteremia. They recommended stopping vancomycin, removing PICC line (will do prior to discharge due to difficult stick) and ID signed off. - 1 of 2 blood cultures from 10/6 shows gram-positive cocci in clusters, follow final cultures, possibly contaminant. - I discussed with Dr. Daiva Eves, ID who reviewed blood culture results and indicated that these are most likely contaminants.  Vomiting, intermittent - Patient having intermittent vomiting may be up to 1 time daily for the last 2 days. KUB on 10/4 had no acute findings. Abdominal exam benign with good bowel sounds. Unclear etiology. Repeat KUB. As per my discussion with daughter couple days ago, he used to have vomiting when being bolus fed but currently on continuous infusion. Not sure if the volume is high for him. Requested dietitian to consult and make recommendations,? Change feeding type of volume. Added Protonix via PEG tube.  Pressure ulcer of right heel stage IV/osteomyelitis.  -patient discharged from [facility] 01/28/2017  -Prior to that he was at [facility] for completion of IV antibiotics for wound care and nutritional support since September 7.  -Wound care consult 10/4 appreciated. Multiple wounds. Prior sacral stage IV pressure injury has healed. Still has right heel chronic stage IV pressure injury. Management per wound care team. - pulses found with doppler to left foot  Anasarca. Worsening. Seen by PCP recently who started a Lasix trial. - Likely multifactorial related to poor nutritional status, hypoalbuminemia and chronic systolic CHF.  -Nutritional consult -pre-albumin 23.2 -Continue tube feeding - despite increasing dose of oral/tube Lasix, had significant edema without significant improvement. Changed Lasix to 40 mg IV every 12 hours on 10/7. Brisk diuresis, -  6.3 L since admission. Continue current management and monitor BMP closely.  History of CVA.  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 That time he had a right lower extremity DVT. Stroke was thought to be cardioembolic in origin and anticoagulation was initiated.   Patient is aphasic but nods head to few questions. Contractures and hemiplegia on the right side. Also seems paraplegic.  Chronic systolic CHF. Chest xray with Mild left basilar subsegmental atelectasis is noted. Right upper lobe opacity is noted concerning for possible pneumonia. Afebrile, no leukocytosis non-toxic appearing. Lactic acid within limits of normal. Oxygen saturation level greater than 90% on room air. Chart review indicates ejection fraction 30-35% 2 years ago. Care everywhere indicates echo September 2018 EF 55%, mild apical hypokinesis, trace tricuspid regurg, mild left atrial enlargement home medications include Lasix,isordil, cozaar, metoprolol. - Changed to IV Lasix as above.  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   Adult failure to thrive  - Multifactorial. Discussed with palliative care and their input appreciated. At this time, family wishes to pursue aggressive care.  Anemia     likely due to chronic disease. Baseline hemoglobin probably in the 8-9 gram range. This has fluctuated over the last 24 hours and down to 7.5. No overt bleeding reported. Follow CBC in a.m. May consider transfusion if hemoglobin <7. Hemoglobin remained stable at 7.5.  DVT prophylaxis: Fully anticoagulated on Eliquis Code Status:Full Code Family Communication: Discussed in detail with patients daughter/HCPOA. Updated care and answered questions. Disposition Plan:  Patient's daughter expresses desire to take patient home and not to a skilled nursing  facility.    Consultants:    ID  Palliative care     Subjective: Overnight events noted. Apparently had another episode of vomiting and patient declined RN attempts to reconnect tube feeding by nodding NO. Last BM was last night. Unable to understand speech.  Objective: Vitals:   02/08/17 1537 02/08/17 1900 02/09/17 0600 02/09/17 1421  BP: (!) 158/75 (!) 171/63 (!) 165/70 (!) 160/71  Pulse: 73 72 84 86  Resp: 16 20 18 16   Temp: 98.2 F (36.8 C) 98.9 F (37.2 C) 99 F (37.2 C) 99.1 F (37.3 C)  TempSrc: Axillary Axillary Axillary Axillary  SpO2: 98% 100% 98% 95%  Weight:        Intake/Output Summary (Last 24 hours) at 02/09/17 1551 Last data filed at 02/09/17 1422  Gross per 24 hour  Intake             1355 ml  Output             5200 ml  Net            -3845 ml    Examination:  General exam: Middle-aged male, small built and frail, chronically ill-looking lying comfortably propped up in bed.  Respiratory system: clear to auscultation. No increased work of breathing. Stable without change. Cardiovascular system: S1 and S2 heard, RRR. No JVD or murmurs. 3+ pitting edema of all extremities but mostly of lower extremities. Edema may be slightly better. Gastrointestinal system: Nondistended, soft and nontender. Normal bowel sounds heard. PEG tube intact. Central nervous system: Alert. Tracks activity around with his right eye. Speech is garbled and cannot  understand. Left eye closed, appears chronic. No change and appears stable. Extremities: 3+ pitting edema of all extremities but more so of legs. Right upper extremity contractures. Some movement of left upper extremity but 0 x 5 movement in lower extremity.  Skin: Left heel dressing clean and dry. Mid sacral area with stage I wound which appears mostly as macerated skin but no clear open wound. Right heel stage IV chronic ulcer without acute findings.    Data Reviewed: I have personally reviewed following labs and  imaging studies  CBC:  Recent Labs Lab 02/04/17 1255 02/05/17 0536 02/05/17 1157 02/06/17 0421 02/07/17 0326 02/08/17 0337  WBC 7.4 6.0  --  6.9 6.4 6.6  NEUTROABS 4.5  --   --   --   --   --   HGB 8.8* 7.7* 8.4* 7.5* 7.5* 7.5*  HCT 28.2* 23.6* 26.6* 23.4* 23.6* 24.0*  MCV 93.4 92.9  --  94.0 94.4 93.4  PLT 235 189  --  200 197 198   Basic Metabolic Panel:  Recent Labs Lab 02/05/17 0536 02/06/17 0421 02/07/17 0326 02/08/17 0337 02/09/17 0326  NA 139 139 137 139 140  K 4.1 3.7 3.8 3.6 3.7  CL 107 107 108 109 106  CO2 24 25 25 26 28   GLUCOSE 62* 150* 141* 129* 133*  BUN 19 17 14 13 14   CREATININE 0.87 1.01 0.95 0.81 0.79  CALCIUM 8.3* 7.9* 7.9* 8.0* 8.2*   GFR: Estimated Creatinine Clearance: 112.2 mL/min (by C-G formula based on SCr of 0.79 mg/dL). Liver Function Tests:  Recent Labs Lab 02/04/17 1255 02/04/17 2116  AST 33  --   ALT 22  --   ALKPHOS 103  --   BILITOT 0.4  --   PROT 5.7*  --   ALBUMIN 2.0* 2.0*   HbA1C: No results for input(s): HGBA1C in the last 72 hours. CBG:  Recent Labs Lab 02/08/17 1601 02/08/17 2008 02/09/17 0036 02/09/17 0502 02/09/17 1202  GLUCAP 142* 108* 116* 119* 141*   Urine analysis:    Component Value Date/Time   COLORURINE STRAW (A) 02/04/2017 2316   APPEARANCEUR CLEAR 02/04/2017 2316   LABSPEC 1.008 02/04/2017 2316   PHURINE 7.0 02/04/2017 2316   GLUCOSEU NEGATIVE 02/04/2017 2316   HGBUR NEGATIVE 02/04/2017 2316   BILIRUBINUR NEGATIVE 02/04/2017 2316   KETONESUR NEGATIVE 02/04/2017 2316   PROTEINUR 100 (A) 02/04/2017 2316   NITRITE NEGATIVE 02/04/2017 2316   LEUKOCYTESUR NEGATIVE 02/04/2017 2316     ) Recent Results (from the past 240 hour(s))  Blood culture (routine x 2)     Status: Abnormal (Preliminary result)   Collection Time: 02/04/17  1:00 PM  Result Value Ref Range Status   Specimen Description BLOOD RIGHT ANTECUBITAL  Final   Special Requests   Final    BOTTLES DRAWN AEROBIC ONLY Blood  Culture adequate volume   Culture  Setup Time   Final    GRAM POSITIVE COCCI AEROBIC BOTTLE ONLY CRITICAL RESULT CALLED TO, READ BACK BY AND VERIFIED WITH: PHARMD N BATCHELDER [phone] MLM    Culture (A)  Final    STAPHYLOCOCCUS SPECIES (COAGULASE NEGATIVE) THE SIGNIFICANCE OF ISOLATING THIS ORGANISM FROM A SINGLE SET OF BLOOD CULTURES WHEN MULTIPLE SETS ARE DRAWN IS UNCERTAIN. PLEASE NOTIFY THE MICROBIOLOGY DEPARTMENT WITHIN ONE WEEK IF SPECIATION AND SENSITIVITIES ARE REQUIRED.    Report Status PENDING  Incomplete  Blood Culture ID Panel (Reflexed)     Status: None   Collection Time: 02/04/17  1:00  PM  Result Value Ref Range Status   Enterococcus species NOT DETECTED NOT DETECTED Final   Listeria monocytogenes NOT DETECTED NOT DETECTED Final   Staphylococcus species NOT DETECTED NOT DETECTED Final   Staphylococcus aureus NOT DETECTED NOT DETECTED Final   Streptococcus species NOT DETECTED NOT DETECTED Final   Streptococcus agalactiae NOT DETECTED NOT DETECTED Final   Streptococcus pneumoniae NOT DETECTED NOT DETECTED Final   Streptococcus pyogenes NOT DETECTED NOT DETECTED Final   Acinetobacter baumannii NOT DETECTED NOT DETECTED Final   Enterobacteriaceae species NOT DETECTED NOT DETECTED Final   Enterobacter cloacae complex NOT DETECTED NOT DETECTED Final   Escherichia coli NOT DETECTED NOT DETECTED Final   Klebsiella oxytoca NOT DETECTED NOT DETECTED Final   Klebsiella pneumoniae NOT DETECTED NOT DETECTED Final   Proteus species NOT DETECTED NOT DETECTED Final   Serratia marcescens NOT DETECTED NOT DETECTED Final   Haemophilus influenzae NOT DETECTED NOT DETECTED Final   Neisseria meningitidis NOT DETECTED NOT DETECTED Final   Pseudomonas aeruginosa NOT DETECTED NOT DETECTED Final   Candida albicans NOT DETECTED NOT DETECTED Final   Candida glabrata NOT DETECTED NOT DETECTED Final   Candida krusei NOT DETECTED NOT DETECTED Final   Candida parapsilosis NOT DETECTED NOT  DETECTED Final   Candida tropicalis NOT DETECTED NOT DETECTED Final  Blood culture (routine x 2)     Status: Abnormal (Preliminary result)   Collection Time: 02/04/17  8:05 PM  Result Value Ref Range Status   Specimen Description BLOOD BLOOD LEFT HAND  Final   Special Requests IN PEDIATRIC BOTTLE Blood Culture adequate volume  Final   Culture  Setup Time   Final    GRAM POSITIVE COCCI IN CLUSTERS IN PEDIATRIC BOTTLE CRITICAL RESULT CALLED TO, READ BACK BY AND VERIFIED WITH: Lytle Butte PHARMD 2231 02/05/17 A BROWNING    Culture (A)  Final    STAPHYLOCOCCUS EPIDERMIDIS THE SIGNIFICANCE OF ISOLATING THIS ORGANISM FROM A SINGLE SET OF BLOOD CULTURES WHEN MULTIPLE SETS ARE DRAWN IS UNCERTAIN. PLEASE NOTIFY THE MICROBIOLOGY DEPARTMENT WITHIN ONE WEEK IF SPECIATION AND SENSITIVITIES ARE REQUIRED.    Report Status PENDING  Incomplete  MRSA PCR Screening     Status: Abnormal   Collection Time: 02/05/17  6:03 AM  Result Value Ref Range Status   MRSA by PCR POSITIVE (A) NEGATIVE Final    Comment:        The GeneXpert MRSA Assay (FDA approved for NASAL specimens only), is one component of a comprehensive MRSA colonization surveillance program. It is not intended to diagnose MRSA infection nor to guide or monitor treatment for MRSA infections. RESULT CALLED TO, READ BACK BY AND VERIFIED WITH: S. CRADDOCK 0852 10.04.2018 N. MORRIS   Culture, blood (Routine X 2) w Reflex to ID Panel     Status: None (Preliminary result)   Collection Time: 02/06/17  6:35 PM  Result Value Ref Range Status   Specimen Description BLOOD LEFT HAND  Final   Special Requests   Final    BOTTLES DRAWN AEROBIC AND ANAEROBIC Blood Culture adequate volume   Culture NO GROWTH 3 DAYS  Final   Report Status PENDING  Incomplete  Culture, blood (Routine X 2) w Reflex to ID Panel     Status: None (Preliminary result)   Collection Time: 02/06/17  6:48 PM  Result Value Ref Range Status   Specimen Description BLOOD RIGHT HAND   Final   Special Requests IN PEDIATRIC BOTTLE Blood Culture adequate volume  Final   Culture  Setup Time   Final    GRAM POSITIVE COCCI IN CLUSTERS IN PEDIATRIC BOTTLE CRITICAL RESULT CALLED TO, READ BACK BY AND VERIFIED WITH: V.BRYK,PHARMD AT 0542 ON 02/08/17 BY [email or web address]    Culture   Final    GRAM POSITIVE COCCI CULTURE REINCUBATED FOR BETTER GROWTH    Report Status PENDING  Incomplete      Anti-infectives    Start     Dose/Rate Route Frequency Ordered Stop   02/06/17 1200  vancomycin (VANCOCIN) IVPB 1000 mg/200 mL premix  Status:  Discontinued     1,000 mg 200 mL/hr over 60 Minutes Intravenous Every 24 hours 02/06/17 1046 02/07/17 1724   02/04/17 2100  ciprofloxacin (CIPRO) tablet 500 mg     500 mg Per Tube Every 12 hours 02/04/17 1911 02/05/17 2125   02/04/17 2000  ciprofloxacin (CIPRO) tablet 500 mg  Status:  Discontinued     500 mg Per Tube 2 times daily 02/04/17 1617 02/04/17 1911   02/04/17 1730  vancomycin (VANCOCIN) IVPB 750 mg/150 ml premix     750 mg 150 mL/hr over 60 Minutes Intravenous Every 18 hours 02/04/17 1709 02/05/17 1232       Radiology Studies: No results found.      Scheduled Meds: . apixaban  5 mg Per Tube BID  . atorvastatin  80 mg Oral QHS  . baclofen  10 mg Per Tube TID  . cholestyramine  4 g Per Tube 3 times per day  . feeding supplement (PRO-STAT SUGAR FREE 64)  30 mL Per Tube TID  . FLUoxetine  20 mg Per Tube Daily  . furosemide  40 mg Intravenous BID  . gabapentin  300 mg Per Tube BID  . hydrALAZINE  100 mg Per Tube Q8H  . isosorbide dinitrate  20 mg Per Tube TID  . levETIRAcetam  750 mg Per Tube BID  . losartan  25 mg Per Tube Daily  . mouth rinse  15 mL Mouth Rinse BID  . metoprolol tartrate  50 mg Per Tube BID  . oxyCODONE-acetaminophen  0.5 tablet Per Tube BID   Continuous Infusions: . feeding supplement (GLUCERNA 1.2 CAL) Stopped (02/09/17 0035)     LOS: 4 days    Time spent: 35 min    Riyansh Gerstner, MD, FACP,  FHM. [facility] Pager [phone]  If 7PM-7AM, please contact night-coverage [email or web address] Password [email or web address] 02/09/2017, 3:51 PM

## 2017-02-09 NOTE — Care Management Note (Signed)
Case Management Note  Patient Details  Name: Dalton Ferguson MRN: [id number] Date of Birth: 14-Aug-1950  Subjective/Objective:  66 yr old male was admitted with complaint of hypoglycemia anasarca and nonfunctional PICC line.              Action/Plan: Patient's daughter Dalton Ferguson says that patient will return home, not going to SNF. Patient has been seen by [facility]. CM called referral to Shaune Leeks, [facility] Liaison. CM will continue to monitor.    Expected Discharge Date:    pending              Expected Discharge Plan:  Home w Home Health Services  In-House Referral:  NA, Clinical Social Work  Discharge planning Services  CM Consult  Post Acute Care Choice:  Home Health Choice offered to:  Adult Children  DME Arranged:  N/A DME Agency:     HH Arranged:  RN, OT, Nurse's Aide HH Agency:  [facility]  Status of Service:  In process, will continue to follow  If discussed at Long Length of Stay Meetings, dates discussed:    Additional Comments:  Durenda Guthrie, RN 02/09/2017, 3:36 PM

## 2017-02-09 NOTE — Clinical Social Work Note (Signed)
Clinical Social Work Assessment  Patient Details  Name: Dalton Ferguson MRN: [id number] Date of Birth: January 15, 1951  Date of referral:  02/09/17               Reason for consult:  Facility Placement                Permission sought to share information with:  Oceanographer granted to share information::  No  Name::        Agency::     Relationship::     Contact Information:     Housing/Transportation Living arrangements for the past 2 months:  Single Family Home Source of Information:  Adult Children Patient Interpreter Needed:  None Criminal Activity/Legal Involvement Pertinent to Current Situation/Hospitalization:  No - Comment as needed Significant Relationships:  Adult Children, Other Family Members Lives with:  Adult Children Do you feel safe going back to the place where you live?  Yes Need for family participation in patient care:  Yes (Comment)  Care giving concerns:  CSW spoke with daughters Dalton Ferguson and Dalton Ferguson and they confirmed that patient from home and will return home at DC. Daughters confirmed that he will not go to SNF and they have [facility] at home. CW validated and will advise RNCM of same. Daughter, Dalton Ferguson is the main contact and she can be reached at [phone].  Social Worker assessment / plan:  No CSW needs as patient will return home at DC per family.  Employment status:  Disabled (Comment on whether or not currently receiving Disability) Insurance information:  Medicare PT Recommendations:  Skilled Nursing Facility Information / Referral to community resources:  Skilled Nursing Facility  Patient/Family's Response to care:  Family appreciative of CSW assistance and reports no issues or concerns.  Patient/Family's Understanding of and Emotional Response to Diagnosis, Current Treatment, and Prognosis:  Family/Patient has good understanding of diagnosis, current treatment, and prognosis. Pt will return home with home  services as patient resides with daughter and being cared for sufficiently at home. No issues or concerns are identified.  Emotional Assessment Appearance:  Appears older than stated age Attitude/Demeanor/Rapport:  Other, Unable to Assess Affect (typically observed):  Unable to Assess Orientation:  Oriented to Self, Oriented to Place, Oriented to Situation Alcohol / Substance use:  Not Applicable Psych involvement (Current and /or in the community):  No (Comment)  Discharge Needs  Concerns to be addressed:  Care Coordination Readmission within the last 30 days:  No Current discharge risk:  Dependent with Mobility, Physical Impairment Barriers to Discharge:  No Barriers Identified   Dalton Moore, LCSW 02/09/2017, 3:14 PM

## 2017-02-09 NOTE — Progress Notes (Signed)
Patient shakes head NO when RN attempts to reconnect tube feeding. RN notified MD. Nursing will continue to monitor.

## 2017-02-09 NOTE — Progress Notes (Signed)
Nutrition Follow-up  DOCUMENTATION CODES:   Not applicable  INTERVENTION:  Initiate Vital 1.5 formula @ 25 ml/hr via PEG and increase by 10 ml every 4 hours to goal rate of 55 ml/hr.   30 ml Prostat BID per tube.  Tube feeding regimen provides 2180 kcal (100% of needs), 119 grams of protein, and 1003 ml of H2O.   Upon discharge recommend continuation of Vital 1.5 formula (or similar formula) with goal rate of 55 ml/hr with 30 ml Prostat BID (or equivalent). Tube feeding regimen provides 2180 kcal (100% of needs), 119 grams of protein, and 1003 ml of H2O.   NUTRITION DIAGNOSIS:   Inadequate oral intake related to inability to eat as evidenced by NPO status; ongoing  GOAL:   Patient will meet greater than or equal to 90% of their needs; progressing  MONITOR:   TF tolerance, Weight trends, Labs, I & O's, Skin  REASON FOR ASSESSMENT:   Consult Assessment of nutrition requirement/status  ASSESSMENT:   66 y.o. male with medical history significant hypertension, diabetes, stroke with hemiplegia, seizure disorder, congestive heart failure, blindness in left eye, chronic anemia, nonhealing wound status post debridement at [facility], osteomyelitis presents to the emergency Department chief complaint of hypoglycemia anasarca and nonfunctional PICC line.  Tube feeds have been stopped. Pt unable to tolerate Glucerna 1.2 formula. Pt with emesis which per RN has been ongoing at least once daily over the weekend and today. RD consulted to reassess tube feeds. RD to modify tube feeding order and order Vital 1.5 formula product instead to help promote tolerance as Vital formula is a hydrolyzed, peptide based protein and suitable for lactose intolerance. RD to continue to monitor.  Labs and medications reviewed.   Diet Order:   NPO  Skin:  Wound (see comment) (Stg 4 to R heel, Stg 2 sacrum, wound to R leg)  Last BM:  10/6  Height:   Ht Readings from Last 1 Encounters:   02/03/17 6' (1.829 m)    Weight:   Wt Readings from Last 1 Encounters:  02/07/17 231 lb (104.8 kg)    Ideal Body Weight:  81 kg  BMI:  Body mass index is 31.33 kg/m.  Estimated Nutritional Needs:   Kcal:  2000-2200  Protein:  100-110 grams  Fluid:  Per MD  EDUCATION NEEDS:   No education needs identified at this time  Roslyn Smiling, MS, RD, LDN Pager # [phone] After hours/ weekend pager # [phone]

## 2017-02-09 NOTE — Progress Notes (Signed)
Pt has not been on continuous tube feeding today due to refusal by patient this AM after he had an episode of emesis. Pt has apparently had multiple episodes of emesis since tube feeding started. Dr. Waymon Amato aware. He stated to re-consult the dietician to assess patient needs and make changes as sees fit to feedings. Consult placed at 1100.

## 2017-02-10 ENCOUNTER — Encounter (HOSPITAL_COMMUNITY): Payer: Self-pay

## 2017-02-10 LAB — CULTURE, BLOOD (ROUTINE X 2)
SPECIAL REQUESTS: ADEQUATE
SPECIAL REQUESTS: ADEQUATE
Special Requests: ADEQUATE

## 2017-02-10 LAB — GLUCOSE, CAPILLARY
GLUCOSE-CAPILLARY: 204 mg/dL — AB (ref 65–99)
GLUCOSE-CAPILLARY: 151 mg/dL — AB (ref 65–99)
Glucose-Capillary: 163 mg/dL — ABNORMAL HIGH (ref 65–99)
Glucose-Capillary: 200 mg/dL — ABNORMAL HIGH (ref 65–99)
Glucose-Capillary: 215 mg/dL — ABNORMAL HIGH (ref 65–99)
Glucose-Capillary: 212 mg/dL — ABNORMAL HIGH (ref 65–99)

## 2017-02-10 LAB — BASIC METABOLIC PANEL
ANION GAP: 6 (ref 5–15)
BUN: 14 mg/dL (ref 6–20)
CO2: 28 mmol/L (ref 22–32)
Calcium: 7.9 mg/dL — ABNORMAL LOW (ref 8.9–10.3)
Chloride: 105 mmol/L (ref 101–111)
Creatinine, Ser: 0.89 mg/dL (ref 0.61–1.24)
GFR calc Af Amer: >60 mL/min (ref >60)
GLUCOSE: 210 mg/dL — AB (ref 65–99)
POTASSIUM: 3.2 mmol/L — AB (ref 3.5–5.1)
Sodium: 139 mmol/L (ref 135–145)

## 2017-02-10 LAB — CBC
HCT: 24.4 % — ABNORMAL LOW (ref 39.0–52.0)
HEMOGLOBIN: 7.8 g/dL — AB (ref 13.0–17.0)
MCH: 29.9 pg (ref 26.0–34.0)
MCHC: 32 g/dL (ref 30.0–36.0)
MCV: 93.5 fL (ref 78.0–100.0)
PLATELETS: 221 10*3/uL (ref 150–400)
RBC: 2.61 MIL/uL — AB (ref 4.22–5.81)
RDW: 14.5 % (ref 11.5–15.5)
WBC: 5.4 10*3/uL (ref 4.0–10.5)

## 2017-02-10 MED ORDER — PNEUMOCOCCAL VAC POLYVALENT 25 MCG/0.5ML IJ INJ: 0.5000 mL | INJECTION | INTRAMUSCULAR | Status: DC

## 2017-02-10 MED ORDER — INFLUENZA VAC SPLIT HIGH-DOSE 0.5 ML IM SUSY
0.5000 mL | PREFILLED_SYRINGE | INTRAMUSCULAR | Status: DC
  Filled 2017-02-10: qty 0.5

## 2017-02-10 MED ORDER — POTASSIUM CHLORIDE CRYS ER 20 MEQ PO TBCR
40.0000 meq | EXTENDED_RELEASE_TABLET | Freq: Every day | ORAL | Status: DC
  Administered 2017-02-10 – 2017-02-11 (×2): 40 meq via ORAL
  Filled 2017-02-10 (×2): qty 2

## 2017-02-10 NOTE — Progress Notes (Signed)
Increased PEG tube feeding to goal rate of 55 mL/hr around 11:45 AM. Checked on pt throughout the day and he continued to deny any S/S or nausea or discomfort. Able to administer scheduled medications through PEG tube without difficulty or back flow. Will continue to monitor

## 2017-02-10 NOTE — Progress Notes (Signed)
PROGRESS NOTE    Dalton Ferguson  UEA:[id number] DOB: 1951/04/09 DOA: 02/04/2017 PCP: Oneal Grout, MD   Outpatient Specialists:     Brief Narrative:  Dalton Ferguson is a 66 y.o. male with medical history significant hypertension, diabetes, stroke with hemiplegia, seizure disorder, congestive heart failure, blindness in left eye, chronic anemia, nonhealing wound status post debridement at [facility], osteomyelitis presents to the emergency Department chief complaint of hypoglycemia anasarca and nonfunctional PICC line. So evaluation reveals a serum glucose of 50. [facility] asked to admit     Assessment & Plan:   Principal Problem:   Hypoglycemia Active Problems:   History of CVA (cerebrovascular accident)   Seizures (HCC)   Diabetes mellitus type 2, insulin dependent (HCC)   Pressure ulcer of right heel, stage 4 (HCC)   Coagulase negative Staphylococcus bacteremia   Anasarca   Hypertension   Ileus (HCC)   Acute osteomyelitis of right calcaneus (HCC)   Encounter for palliative care   Goals of care, counseling/discussion   Diabetes with Hypoglycemia.  -Etiology uncertain -Holding long-acting insulin -hemoglobin A1c: 5.3 - Patient again vomited last night, tube feeds were held and patient apparently declining resumption of tube feeds. Discussed with RN and monitoring CBGs closely. Will start low-dose D5 W until tube feeds are resumed.  Staphylococcal bacteremia related to non-healing wound and osteo.  -  IV team cleared PICC line use while in ED. - As per outpatient records, patient was supposed to complete course of IV vancomycin and ciprofloxacin on 10/4 which he completed. - Repeat blood cultures from 02/04/17: blood culture's shows coag is negative staph. Vancomycin was continued and repeat blood cultures were drawn 10/6. ID was consulted. As per ID follow-up 10/6, admission blood cultures showed 2 different species of coagulase-negative staph which  suggested that both are contaminants -- recommended stopping vancomycin, removing PICC line (will do prior to discharge due to difficult stick)  - 1 of 2 blood cultures from 10/5 shows gram-positive cocci in clusters--> Staphylococcus epidermidis  Vomiting, intermittent - Patient having intermittent vomiting may be up to 1 time daily for the last 2 days. KUB on 10/4 had no acute findings. Abdominal exam benign with good bowel sounds. Unclear etiology. Repeat KUB. As per my discussion with daughter couple days ago, he used to have vomiting when being bolus fed but currently on continuous infusion.  Feed goals adjusted to 55cc per hour by dietitian 10/8 and input appreciated--Type of feeds also adjusted . Added Protonix via PEG tube. So far no further vomiting so reassess 1 more day and see how things go and if stabilized can discharge hopefully home  Pressure ulcer of right heel stage IV/osteomyelitis.  -patient discharged from [facility] 01/28/2017  -Prior to that he was at [facility] for completion of IV antibiotics for wound care and nutritional support since September 7.  -Wound care consult 10/4 appreciated. Multiple wounds. Prior sacral stage IV pressure injury has healed. Still has right heel chronic stage IV pressure injury. Management per wound care team. - pulses found with doppler to left foot  Anasarca. Worsening. Seen by PCP recently who started a Lasix trial. - Likely multifactorial related to poor nutritional status, hypoalbuminemia and chronic systolic CHF.  -Nutritional consult -pre-albumin 23.2 -Continue tube feeding - despite increasing dose of oral/tube Lasix, had significant edema without significant improvement. Changed Lasix to 40 mg IV every 12 hours on 10/7. Brisk diuresis, -6.3 L since admission. Continue current management and monitor BMP closely.  History of CVA. Chart review indicates patient at [facility] 2016 with left middle cerebral  artery ischemic stroke with hemorrhagic conversion. That time he had a right lower extremity DVT. Stroke was thought to be cardioembolic in origin and anticoagulation was initiated.   Patient is aphasic but nods head to few questions. Contractures and hemiplegia on the right side. Also seems paraplegic.  Chronic systolic CHF. Chest xray with Mild left basilar subsegmental atelectasis is noted. Right upper lobe opacity is noted concerning for possible pneumonia. Afebrile, no leukocytosis non-toxic appearing. Lactic acid within limits of normal. Oxygen saturation level greater than 90% on room air. Chart review indicates ejection fraction 30-35% 2 years ago. Care everywhere indicates echo September 2018 EF 55%, mild apical hypokinesis, trace tricuspid regurg, mild left atrial enlargement home medications include Lasix,isordil, cozaar, metoprolol. - Changed to IV Lasix as above. -now out 5.8 L--> Baseline weight unknown--hopefully to convert to by mouth Lasix in the next 24-48 hours  Hypertension -Continue home meds -Monitor  History of seizure disorder -Continue home med                  Chronic encephalopathy  - Likely related to prior strokes and vascular dementia.   Adult failure to thrive  - Multifactorial. Discussed with palliative care and their input appreciated. At this time, family wishes to pursue aggressive care. -I called to update family on 10/9 and left a voicemail  Anemia     likely due to chronic disease. Baseline hemoglobin probably in the 8-9 gram range. This has fluctuated over the last 24 hours and down to 7.5. No overt bleeding reported. Follow CBC in a.m. May consider transfusion if hemoglobin <7. Hemoglobin remained stable at 7.5.  DVT prophylaxis: Fully anticoagulated on Eliquis Code Status:Full Code Family Communication: no family present but did call and update on 10/9 Disposition Plan:  Patient's daughter expresses desire to take patient home and not to a skilled  nursing facility.    Consultants:    ID  Palliative care     Subjective:   Objective: Vitals:   02/09/17 1421 02/09/17 2107 02/10/17 0525 02/10/17 0633  BP: (!) 160/71 (!) 142/72 (!) 145/79 (!) 143/76  Pulse: 86 (!) 116 94 60  Resp: 16 18 17 18   Temp: 99.1 F (37.3 C) 98.6 F (37 C) 98.2 F (36.8 C) 97.6 F (36.4 C)  TempSrc: Axillary Axillary Axillary Axillary  SpO2: 95% 100% 100% 100%  Weight:        Intake/Output Summary (Last 24 hours) at 02/10/17 1130 Last data filed at 02/10/17 0900  Gross per 24 hour  Intake               10 ml  Output             2150 ml  Net            -2140 ml    Examination: Alert nonverbal trying to speak howeverfacial twisting right eye opens completely Flexure contracture on left upper extremity Chest clinically clear Abdomen soft nontender no rebound PEG tube area seems clean Lower extremities not swollen No rales no rhonchi in axilla Nod yes and no appropriately to answer questions   Data Reviewed: I have personally reviewed following labs and imaging studies  CBC:  Recent Labs Lab 02/04/17 1255 02/05/17 0536 02/05/17 1157 02/06/17 0421 02/07/17 0326 02/08/17 0337 02/10/17 0353  WBC 7.4 6.0  --  6.9 6.4 6.6 5.4  NEUTROABS 4.5  --   --   --   --   --   --  HGB 8.8* 7.7* 8.4* 7.5* 7.5* 7.5* 7.8*  HCT 28.2* 23.6* 26.6* 23.4* 23.6* 24.0* 24.4*  MCV 93.4 92.9  --  94.0 94.4 93.4 93.5  PLT 235 189  --  200 197 198 221   Basic Metabolic Panel:  Recent Labs Lab 02/06/17 0421 02/07/17 0326 02/08/17 0337 02/09/17 0326 02/10/17 0353  NA 139 137 139 140 139  K 3.7 3.8 3.6 3.7 3.2*  CL 107 108 109 106 105  CO2 25 25 26 28 28   GLUCOSE 150* 141* 129* 133* 210*  BUN 17 14 13 14 14   CREATININE 1.01 0.95 0.81 0.79 0.89  CALCIUM 7.9* 7.9* 8.0* 8.2* 7.9*   GFR: Estimated Creatinine Clearance: 100.8 mL/min (by C-G formula based on SCr of 0.89 mg/dL). Liver Function Tests:  Recent Labs Lab 02/04/17 1255  02/04/17 2116  AST 33  --   ALT 22  --   ALKPHOS 103  --   BILITOT 0.4  --   PROT 5.7*  --   ALBUMIN 2.0* 2.0*   HbA1C: No results for input(s): HGBA1C in the last 72 hours. CBG:  Recent Labs Lab 02/09/17 1628 02/09/17 2131 02/10/17 0123 02/10/17 0457 02/10/17 1002  GLUCAP 104* 145* 204* 163* 151*   Urine analysis:    Component Value Date/Time   COLORURINE STRAW (A) 02/04/2017 2316   APPEARANCEUR CLEAR 02/04/2017 2316   LABSPEC 1.008 02/04/2017 2316   PHURINE 7.0 02/04/2017 2316   GLUCOSEU NEGATIVE 02/04/2017 2316   HGBUR NEGATIVE 02/04/2017 2316   BILIRUBINUR NEGATIVE 02/04/2017 2316   KETONESUR NEGATIVE 02/04/2017 2316   PROTEINUR 100 (A) 02/04/2017 2316   NITRITE NEGATIVE 02/04/2017 2316   LEUKOCYTESUR NEGATIVE 02/04/2017 2316      Anti-infectives    Start     Dose/Rate Route Frequency Ordered Stop   02/06/17 1200  vancomycin (VANCOCIN) IVPB 1000 mg/200 mL premix  Status:  Discontinued     1,000 mg 200 mL/hr over 60 Minutes Intravenous Every 24 hours 02/06/17 1046 02/07/17 1724   02/04/17 2100  ciprofloxacin (CIPRO) tablet 500 mg     500 mg Per Tube Every 12 hours 02/04/17 1911 02/05/17 2125   02/04/17 2000  ciprofloxacin (CIPRO) tablet 500 mg  Status:  Discontinued     500 mg Per Tube 2 times daily 02/04/17 1617 02/04/17 1911   02/04/17 1730  vancomycin (VANCOCIN) IVPB 750 mg/150 ml premix     750 mg 150 mL/hr over 60 Minutes Intravenous Every 18 hours 02/04/17 1709 02/05/17 1232       Radiology Studies: Dg Abd Portable 1v  Result Date: 02/09/2017 CLINICAL DATA:  Vomiting EXAM: PORTABLE ABDOMEN - 1 VIEW COMPARISON:  Abdominal radiograph of February 05, 2017 FINDINGS: There are loops of normal caliber gas-filled small and large bowel throughout the abdomen. No free extraluminal gas collections are observed. A gastrostomy tube is in place to the left of midline. The bony structures exhibit no acute abnormalities. IMPRESSION: Fairly stable appearance of the  abdomen. No evidence of bowel obstruction. Electronically Signed   By: David  [place] M.D.   On: 02/09/2017 16:14        Scheduled Meds: . apixaban  5 mg Per Tube BID  . atorvastatin  80 mg Oral QHS  . baclofen  10 mg Per Tube TID  . Chlorhexidine Gluconate Cloth  6 each Topical Q0600  . cholestyramine  4 g Per Tube 3 times per day  . feeding supplement (PRO-STAT SUGAR FREE 64)  30 mL Per Tube BID  .  FLUoxetine  20 mg Per Tube Daily  . furosemide  40 mg Intravenous BID  . gabapentin  300 mg Per Tube BID  . hydrALAZINE  100 mg Per Tube Q8H  . isosorbide dinitrate  20 mg Per Tube TID  . levETIRAcetam  750 mg Per Tube BID  . losartan  25 mg Per Tube Daily  . mouth rinse  15 mL Mouth Rinse BID  . metoprolol tartrate  50 mg Per Tube BID  . mupirocin ointment  1 application Nasal BID  . oxyCODONE-acetaminophen  0.5 tablet Per Tube BID  . pantoprazole sodium  40 mg Per Tube Daily  . potassium chloride  40 mEq Oral Daily   Continuous Infusions: . dextrose 10 mL/hr at 02/09/17 1707  . feeding supplement (VITAL 1.5 CAL) 1,000 mL (02/09/17 1706)     LOS: 5 days    Time spent: 15 min  Pleas Koch, MD [facility] [phone]

## 2017-02-11 LAB — CULTURE, BLOOD (ROUTINE X 2)
CULTURE: NO GROWTH
Special Requests: ADEQUATE

## 2017-02-11 LAB — GLUCOSE, CAPILLARY
GLUCOSE-CAPILLARY: 257 mg/dL — AB (ref 65–99)
Glucose-Capillary: 171 mg/dL — ABNORMAL HIGH (ref 65–99)
Glucose-Capillary: 223 mg/dL — ABNORMAL HIGH (ref 65–99)
Glucose-Capillary: 232 mg/dL — ABNORMAL HIGH (ref 65–99)
Glucose-Capillary: 232 mg/dL — ABNORMAL HIGH (ref 65–99)

## 2017-02-11 MED ORDER — "AQUACEL FOAM 3.2""X3.2"" EX PADS": 1.0000 | MEDICATED_PAD | Freq: Every day | CUTANEOUS | 0 refills | Status: DC

## 2017-02-11 MED ORDER — COLLAGENASE 250 UNIT/GM EX OINT: 1.0000 "application " | TOPICAL_OINTMENT | Freq: Every day | CUTANEOUS | 0 refills | Status: DC

## 2017-02-11 MED ORDER — PRO-STAT SUGAR FREE PO LIQD: 30.0000 mL | Freq: Two times a day (BID) | ORAL | 0 refills | Status: DC

## 2017-02-11 MED ORDER — PANTOPRAZOLE SODIUM 40 MG PO PACK: 40.0000 mg | PACK | Freq: Every day | ORAL | 2 refills | Status: DC

## 2017-02-11 MED ORDER — VITAL 1.5 CAL PO LIQD: 1000.0000 mL | ORAL | 0 refills | Status: DC

## 2017-02-11 MED ORDER — OXYCODONE-ACETAMINOPHEN 5-325 MG PO TABS: 0.5000 | ORAL_TABLET | Freq: Two times a day (BID) | ORAL | 0 refills | Status: DC

## 2017-02-11 NOTE — Social Work (Addendum)
CSW will assist with setting up transport via [facility].  CSW will sign off for now as social work intervention is no longer needed. Please consult [place] again if new need arises.  Keene Breath, LCSW Clinical Social Worker [phone]

## 2017-02-11 NOTE — Discharge Summary (Signed)
Physician Discharge Summary  MICHAH SCHOU IHK:[id number] DOB: 05-Jan-1951 DOA: 02/04/2017  PCP: Oneal Grout, MD  Admit date: 02/04/2017 Discharge date: 02/11/2017  Time spent: 50 minutes  Recommendations for Outpatient Follow-up:  1. Needs NO further insulin-was hypoglycemic this admit--would just monitor and reassessa s OP--could maybe start low dose 70/30 if sugars are stable 2. Needs local wound care to RLE with santyl and prevalon boots on d/c 3. Continuous TF 55cc/h 4. Needs OP goals of care   Discharge Diagnoses:  Principal Problem:   Hypoglycemia Active Problems:   History of CVA (cerebrovascular accident)   Seizures (HCC)   Diabetes mellitus type 2, insulin dependent (HCC)   Pressure ulcer of right heel, stage 4 (HCC)   Coagulase negative Staphylococcus bacteremia   Anasarca   Hypertension   Ileus (HCC)   Acute osteomyelitis of right calcaneus (HCC)   Encounter for palliative care   Goals of care, counseling/discussion   Discharge Condition: gaurded  Diet recommendation: diabetic hh  Filed Weights   02/07/17 1048  Weight: 104.8 kg (231 lb)    History of present illness:    Dalton Thome Smithis a 66 y.o.malewith medical history significant hypertension, diabetes, stroke with hemiplegia, seizure disorder, congestive heart failure, blindness in left eye, chronic anemia, nonhealing wound status post debridement at [facility], osteomyelitis presents to the emergency Department chief complaint of hypoglycemia anasarca and nonfunctional PICC line. So evaluation reveals a serum glucose of 50. [facility] asked to admit   Hospital Course:  Diabetes with Hypoglycemia.  -Etiology uncertain -Holding long-acting insulin -hemoglobin A1c: 5.3 - Patient again vomited last night, tube feeds were held and patient apparently declining resumption of tube feeds.  Discussed with RN and monitoring CBGs closely.  Tolerating TF 55cc/h  Staphylococcal bacteremia  related to non-healing wound and osteo.  -  IV team cleared PICC line use while in ED. - As per outpatient records, patient was supposed to complete course of IV vancomycin and ciprofloxacin on 10/4 which he completed. - Repeat blood cultures from 02/04/17: blood culture's shows coag is negative staph. Vancomycin was continued and repeat blood cultures were drawn 10/6. ID was consulted. As per ID follow-up 10/6, admission blood cultures showed 2 different species of coagulase-negative staph which suggested that both are contaminants  -- stopping vancomycin, removing PICC line on d/c  - 1 of 2 blood cultures from 10/5 shows gram-positive cocci in clusters--> Staphylococcus epidermidis  Vomiting, intermittent - Patient having intermittent vomiting may be up to 1 time daily for the last 2 days. KUB on 10/4 had no acute findings. Abdominal exam benign with good bowel sounds. Unclear etiology. Repeat KUB. Per daughter--had had vomiting when being bolus fed but currently on continuous infusion.  Feed goals adjusted to 55cc per hour by dietitian 10/8 and input appreciated--Type of feeds also adjusted .Added Protonix via PEG tube. Resolved on d/c home  Pressure ulcer of right heel stage IV/osteomyelitis.  -patient discharged from [facility] 01/28/2017  -Prior to that he was at [facility] for completion of IV antibiotics for wound care and nutritional support since September 7.  -Wound care consult 10/4 appreciated. Multiple wounds. Prior sacral stage IV pressure injury has healed. Still has right heel chronic stage IV pressure injury. Management per wound care team. - pulses found with doppler to left foot -Aquacel dressings to heel  Anasarca. Worsening. Seen by PCP recently who started a Lasix trial. - Likely multifactorial related to poor nutritional status, hypoalbuminemia and chronic systolic CHF.  -  Nutritional consult -pre-albumin 23.2 -Continue tube feeding - despite increasing  dose of oral/tube Lasix, had significant edema without significant improvement. Changed Lasix to 40 mg IV every 12 hours on 10/7. Brisk diuresis, -6.3 L since admission. Continue current management and monitor BMP closely.  History of CVA. 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 That time he had a right lower extremity DVT. Stroke was thought to be cardioembolic in origin and anticoagulation was initiated.  Patient is aphasic but nods head to few questions. Contractures and hemiplegia on the right side. Also seems paraplegic.  Chronic systolic CHF. Chest xray with Mild left basilar subsegmental atelectasis is noted. Right upper lobe opacity is noted concerning for possible pneumonia. Afebrile, no leukocytosis non-toxic appearing. Lactic acid within limits of normal. Oxygen saturation level greater than 90% on room air. Chart review indicates ejection fraction 30-35% 2 years ago. Care everywhere indicates echo September 2018 EF 55%, mild apical hypokinesis, trace tricuspid regurg, mild left atrial enlargement home medications include Lasix,isordil, cozaar, metoprolol. - Changed to IV Lasix as above. -now out 5.8 L--> Baseline weight unknow On d/c home lasix 40 daily   Hypertension -Continue home meds -Monitor  History of seizure disorder -Continue home med   Chronic encephalopathy  - Likely related to prior strokes and vascular dementia.   Adult failure to thrive  - Multifactorial. Discussed with palliative care and their input appreciated.  Anemia   likely due to chronic disease. Baseline hemoglobin probably in the 8-9 gram range. This has fluctuated over the last 24 hours and down to 7.5. No overt bleeding reported. Follow CBC in a.m. May consider transfusion if hemoglobin <7. Hemoglobin remained stable at 7.5.  Consultations:  ID  nephro  Discharge  Exam: Vitals:   02/11/17 0600 02/11/17 1310  BP: (!) 149/65 (!) 133/52  Pulse: 63 61  Resp: 14 16  Temp: 98.3 F (36.8 C) 98.1 F (36.7 C)  SpO2: 100% 100%    General: alert pleasant motionjing with hands Cardiovascular: s1 s 2no m/r/g Respiratory: clear no added sound Dense flexure contracture deficit to L side L eye closed.  R heel has wound--also aquacel foam In prevalon boots  Discharge Instructions   Discharge Instructions    Diet - low sodium heart healthy    Complete by:  As directed    Increase activity slowly    Complete by:  As directed      Current Discharge Medication List    START taking these medications   Details  Amino Acids-Protein Hydrolys (FEEDING SUPPLEMENT, PRO-STAT SUGAR FREE 64,) LIQD Place 30 mLs into feeding tube 2 (two) times daily. Qty: 900 mL, Refills: 0    Nutritional Supplements (FEEDING SUPPLEMENT, VITAL 1.5 CAL,) LIQD Place 1,000 mLs into feeding tube continuous. Qty: 1 Can, Refills: 0    pantoprazole sodium (PROTONIX) 40 mg/20 mL PACK Place 20 mLs (40 mg total) into feeding tube daily. Qty: 60 each, Refills: 2    Wound Dressings (AQUACEL FOAM 3.2"X3.2") PADS Apply 1 each topically daily. Qty: 30 each, Refills: 0      CONTINUE these medications which have NOT CHANGED   Details  apixaban (ELIQUIS) 5 MG TABS tablet Place 5 mg into feeding tube daily.    atorvastatin (LIPITOR) 80 MG tablet 80 mg by PEG Tube route at bedtime.    baclofen (LIORESAL) 10 MG tablet Place 10 mg into feeding tube 3 (three) times daily.  Cholecalciferol (VITAMIN D-3 PO) 3,000 Units by PEG Tube route daily.     cholestyramine (QUESTRAN) 4 g packet Place 4 g into feeding tube 3 (three) times daily.    FLUoxetine (PROZAC) 20 MG capsule Place 20 mg into feeding tube daily.    furosemide (LASIX) 40 MG tablet Place 40 mg into feeding tube daily.    gabapentin (NEURONTIN) 300 MG capsule Place 300 mg into feeding tube 2 (two) times daily.    hydrALAZINE  (APRESOLINE) 100 MG tablet Place 100 mg into feeding tube every 8 (eight) hours.    isosorbide dinitrate (ISORDIL) 20 MG tablet Place 20 mg into feeding tube 3 (three) times daily.    LACTOBACILLUS PO 1 tablet by PEG Tube route 2 (two) times daily.     levETIRAcetam (KEPPRA) 100 MG/ML solution Place 750 mg into feeding tube 2 (two) times daily.    losartan (COZAAR) 25 MG tablet Place 25 mg into feeding tube daily.    metoprolol tartrate (LOPRESSOR) 50 MG tablet Place 50 mg into feeding tube 2 (two) times daily.    ondansetron (ZOFRAN) 4 MG tablet Place 4 mg into feeding tube every 8 (eight) hours as needed for nausea or vomiting.    oxyCODONE-acetaminophen (PERCOCET/ROXICET) 5-325 MG tablet Place 0.5 tablets into feeding tube 2 (two) times daily.      STOP taking these medications     ciprofloxacin (CIPRO) 500 MG tablet      Insulin Detemir (LEVEMIR FLEXTOUCH) 100 UNIT/ML Pen      Vancomycin HCl in NaCl 750-0.9 MG/250ML-% SOLN        No Known Allergies    The results of significant diagnostics from this hospitalization (including imaging, microbiology, ancillary and laboratory) are listed below for reference.    Significant Diagnostic Studies: Ct Head Wo Contrast  Result Date: 02/04/2017 CLINICAL DATA:  Altered mental status EXAM: CT HEAD WITHOUT CONTRAST TECHNIQUE: Contiguous axial images were obtained from the base of the skull through the vertex without intravenous contrast. COMPARISON:  Brain MRI 05/06/2016 FINDINGS: Brain: No mass lesion, intraparenchymal hemorrhage or extra-axial collection. No evidence of acute cortical infarct. Severe encephalomalacia at multiple bilateral MCA distribution locations. Vascular: No hyperdense vessel or unexpected calcification. Skull: Normal visualized skull base, calvarium and extracranial soft tissues. Sinuses/Orbits: No sinus fluid levels or advanced mucosal thickening. No mastoid effusion. Left globe prosthesis. IMPRESSION: 1. No acute  abnormality. 2. Severe encephalomalacia at the site of multiple remote MCA distribution infarcts bilaterally. Electronically Signed   By: Deatra Robinson M.D.   On: 02/04/2017 19:06   Dg Chest Port 1 View  Result Date: 02/04/2017 CLINICAL DATA:  Status post PICC placement. EXAM: PORTABLE CHEST 1 VIEW COMPARISON:  Radiograph of January 02, 2017. FINDINGS: Stable cardiomegaly is noted. Atherosclerosis of thoracic aorta is noted. Sternotomy wires are noted. Interval placement of left-sided PICC line with distal tip seen in expected position of the SVC. Mild left basilar subsegmental atelectasis is noted with associated minimal pleural effusion. Minimal right pleural effusion is noted. Right upper lobe opacity is noted concerning for possible pneumonia. Bony thorax is unremarkable. No pneumothorax is noted. IMPRESSION: Interval placement of left-sided PICC line with distal tip in expected position of the SVC. Aortic atherosclerosis. Minimal bilateral pleural effusions are noted. Mild left basilar subsegmental atelectasis is noted. Right upper lobe opacity is noted concerning for possible pneumonia. Electronically Signed   By: Lupita Raider, M.D.   On: 02/04/2017 11:08   Dg Ankle Right Port  Result Date: 02/04/2017  CLINICAL DATA:  Cutaneous wound over the right heel positive for MRSA. EXAM: PORTABLE RIGHT ANKLE - 2 VIEW COMPARISON:  MRI of the foot and x-ray of the heel dated August 30th and December 31, 2016 respectively. FINDINGS: The bones are subjectively osteopenic. The destructive changes of the posterior aspect of the calcaneus are not evident currently. There is no fracture. The soft tissues are unremarkable. There is osteopenia of the distal tibia and fibula and of the foot. IMPRESSION: The previously demonstrated loss of the sharp cortical line of the posterior inferior aspect of the calcaneus is no longer evident. No new destructive changes are observed. Electronically Signed   By: David  [place] M.D.    On: 02/04/2017 11:46   Dg Abd Portable 1v  Result Date: 02/09/2017 CLINICAL DATA:  Vomiting EXAM: PORTABLE ABDOMEN - 1 VIEW COMPARISON:  Abdominal radiograph of February 05, 2017 FINDINGS: There are loops of normal caliber gas-filled small and large bowel throughout the abdomen. No free extraluminal gas collections are observed. A gastrostomy tube is in place to the left of midline. The bony structures exhibit no acute abnormalities. IMPRESSION: Fairly stable appearance of the abdomen. No evidence of bowel obstruction. Electronically Signed   By: David  [place] M.D.   On: 02/09/2017 16:14   Dg Abd Portable 1v  Result Date: 02/05/2017 CLINICAL DATA:  Possible ileus. Unknown hx concerning bowel movements and emesis.Hx of DM, CHF, stroke. EXAM: PORTABLE ABDOMEN - 1 VIEW COMPARISON:  01/02/2017 FINDINGS: There is no bowel dilation to suggest obstruction or generalized adynamic ileus. Gastrostomy tube projects in the mid stomach, stable. No evidence of renal or ureteral stones. Stable vascular calcifications are noted along the aorta and iliac and femoral vessels. No acute skeletal abnormality. IMPRESSION: 1. No evidence of bowel obstruction or generalized adynamic ileus. No acute findings. Electronically Signed   By: Amie Portland M.D.   On: 02/05/2017 19:04    Microbiology: Recent Results (from the past 240 hour(s))  Blood culture (routine x 2)     Status: Abnormal   Collection Time: 02/04/17  1:00 PM  Result Value Ref Range Status   Specimen Description BLOOD RIGHT ANTECUBITAL  Final   Special Requests   Final    BOTTLES DRAWN AEROBIC ONLY Blood Culture adequate volume   Culture  Setup Time   Final    GRAM POSITIVE COCCI AEROBIC BOTTLE ONLY CRITICAL RESULT CALLED TO, READ BACK BY AND VERIFIED WITH: PHARMD N BATCHELDER [phone] MLM    Culture (A)  Final    STAPHYLOCOCCUS SPECIES (COAGULASE NEGATIVE) THE SIGNIFICANCE OF ISOLATING THIS ORGANISM FROM A SINGLE SET OF BLOOD CULTURES WHEN MULTIPLE  SETS ARE DRAWN IS UNCERTAIN. PLEASE NOTIFY THE MICROBIOLOGY DEPARTMENT WITHIN ONE WEEK IF SPECIATION AND SENSITIVITIES ARE REQUIRED.    Report Status 02/10/2017 FINAL  Final  Blood Culture ID Panel (Reflexed)     Status: None   Collection Time: 02/04/17  1:00 PM  Result Value Ref Range Status   Enterococcus species NOT DETECTED NOT DETECTED Final   Listeria monocytogenes NOT DETECTED NOT DETECTED Final   Staphylococcus species NOT DETECTED NOT DETECTED Final   Staphylococcus aureus NOT DETECTED NOT DETECTED Final   Streptococcus species NOT DETECTED NOT DETECTED Final   Streptococcus agalactiae NOT DETECTED NOT DETECTED Final   Streptococcus pneumoniae NOT DETECTED NOT DETECTED Final   Streptococcus pyogenes NOT DETECTED NOT DETECTED Final   Acinetobacter baumannii NOT DETECTED NOT DETECTED Final   Enterobacteriaceae species NOT DETECTED NOT DETECTED Final   Enterobacter  cloacae complex NOT DETECTED NOT DETECTED Final   Escherichia coli NOT DETECTED NOT DETECTED Final   Klebsiella oxytoca NOT DETECTED NOT DETECTED Final   Klebsiella pneumoniae NOT DETECTED NOT DETECTED Final   Proteus species NOT DETECTED NOT DETECTED Final   Serratia marcescens NOT DETECTED NOT DETECTED Final   Haemophilus influenzae NOT DETECTED NOT DETECTED Final   Neisseria meningitidis NOT DETECTED NOT DETECTED Final   Pseudomonas aeruginosa NOT DETECTED NOT DETECTED Final   Candida albicans NOT DETECTED NOT DETECTED Final   Candida glabrata NOT DETECTED NOT DETECTED Final   Candida krusei NOT DETECTED NOT DETECTED Final   Candida parapsilosis NOT DETECTED NOT DETECTED Final   Candida tropicalis NOT DETECTED NOT DETECTED Final  Blood culture (routine x 2)     Status: Abnormal   Collection Time: 02/04/17  8:05 PM  Result Value Ref Range Status   Specimen Description BLOOD BLOOD LEFT HAND  Final   Special Requests IN PEDIATRIC BOTTLE Blood Culture adequate volume  Final   Culture  Setup Time   Final    GRAM  POSITIVE COCCI IN CLUSTERS IN PEDIATRIC BOTTLE CRITICAL RESULT CALLED TO, READ BACK BY AND VERIFIED WITH: Lytle Butte PHARMD 2231 02/05/17 A BROWNING    Culture (A)  Final    STAPHYLOCOCCUS EPIDERMIDIS SUSCEPTIBILITIES PERFORMED ON PREVIOUS CULTURE WITHIN THE LAST 5 DAYS.    Report Status 02/10/2017 FINAL  Final  MRSA PCR Screening     Status: Abnormal   Collection Time: 02/05/17  6:03 AM  Result Value Ref Range Status   MRSA by PCR POSITIVE (A) NEGATIVE Final    Comment:        The GeneXpert MRSA Assay (FDA approved for NASAL specimens only), is one component of a comprehensive MRSA colonization surveillance program. It is not intended to diagnose MRSA infection nor to guide or monitor treatment for MRSA infections. RESULT CALLED TO, READ BACK BY AND VERIFIED WITH: S. CRADDOCK [id number] 10.04.2018 N. MORRIS   Culture, blood (Routine X 2) w Reflex to ID Panel     Status: None   Collection Time: 02/06/17  6:35 PM  Result Value Ref Range Status   Specimen Description BLOOD LEFT HAND  Final   Special Requests   Final    BOTTLES DRAWN AEROBIC AND ANAEROBIC Blood Culture adequate volume   Culture NO GROWTH 5 DAYS  Final   Report Status 02/11/2017 FINAL  Final  Culture, blood (Routine X 2) w Reflex to ID Panel     Status: Abnormal   Collection Time: 02/06/17  6:48 PM  Result Value Ref Range Status   Specimen Description BLOOD RIGHT HAND  Final   Special Requests IN PEDIATRIC BOTTLE Blood Culture adequate volume  Final   Culture  Setup Time   Final    GRAM POSITIVE COCCI IN CLUSTERS IN PEDIATRIC BOTTLE CRITICAL RESULT CALLED TO, READ BACK BY AND VERIFIED WITH: V.BRYK,PHARMD AT 0542 ON 02/08/17 BY [email or web address]    Culture STAPHYLOCOCCUS EPIDERMIDIS (A)  Final   Report Status 02/10/2017 FINAL  Final   Organism ID, Bacteria STAPHYLOCOCCUS EPIDERMIDIS  Final      Susceptibility   Staphylococcus epidermidis - MIC*    CIPROFLOXACIN 4 RESISTANT Resistant     ERYTHROMYCIN >=8 RESISTANT Resistant      GENTAMICIN <=0.5 SENSITIVE Sensitive     OXACILLIN <=0.25 SENSITIVE Sensitive     TETRACYCLINE 2 SENSITIVE Sensitive     VANCOMYCIN <=0.5 SENSITIVE Sensitive     TRIMETH/SULFA 80 RESISTANT Resistant  CLINDAMYCIN <=0.25 SENSITIVE Sensitive     RIFAMPIN <=0.5 SENSITIVE Sensitive     Inducible Clindamycin NEGATIVE Sensitive     * STAPHYLOCOCCUS EPIDERMIDIS     Labs: Basic Metabolic Panel:  Recent Labs Lab 02/06/17 0421 02/07/17 0326 02/08/17 0337 02/09/17 0326 02/10/17 0353  NA 139 137 139 140 139  K 3.7 3.8 3.6 3.7 3.2*  CL 107 108 109 106 105  CO2 25 25 26 28 28   GLUCOSE 150* 141* 129* 133* 210*  BUN 17 14 13 14 14   CREATININE 1.01 0.95 0.81 0.79 0.89  CALCIUM 7.9* 7.9* 8.0* 8.2* 7.9*   Liver Function Tests:  Recent Labs Lab 02/04/17 2116  ALBUMIN 2.0*   No results for input(s): LIPASE, AMYLASE in the last 168 hours. No results for input(s): AMMONIA in the last 168 hours. CBC:  Recent Labs Lab 02/05/17 0536 02/05/17 1157 02/06/17 0421 02/07/17 0326 02/08/17 0337 02/10/17 0353  WBC 6.0  --  6.9 6.4 6.6 5.4  HGB 7.7* 8.4* 7.5* 7.5* 7.5* 7.8*  HCT 23.6* 26.6* 23.4* 23.6* 24.0* 24.4*  MCV 92.9  --  94.0 94.4 93.4 93.5  PLT 189  --  200 197 198 221   Cardiac Enzymes: No results for input(s): CKTOTAL, CKMB, CKMBINDEX, TROPONINI in the last 168 hours. BNP: BNP (last 3 results) No results for input(s): BNP in the last 8760 hours.  ProBNP (last 3 results) No results for input(s): PROBNP in the last 8760 hours.  CBG:  Recent Labs Lab 02/10/17 2146 02/11/17 0036 02/11/17 0600 02/11/17 0804 02/11/17 1144  GLUCAP 212* 171* 232* 257* 223*       Signed:  Rhetta Mura MD   [facility] 02/11/2017, 2:46 PM

## 2017-02-11 NOTE — Progress Notes (Addendum)
[facility] here to pick up pt for transport home. Copy of d/c instructions sent with pt for daughter. Discussed with daughter, Dalton Ferguson earlier and informed of discharge home order from Dr Mahala Menghini. Pt had PICC line d/c'd earlier, dsg CDI. Pt d/c'd with belongings at this time, dressings in room sent with pt for continued care of wounds, as pt will have HHRN to follow at home ( as well as other [facility] services).  Also sent with bottle of Vital tube feeding for home use in case pt supply not delivered yet.   Attempted to call daughter, Dalton Ferguson to inform of pt leaving. Unable reach her, no answer at time. Attempted call again at 2025, no answer, phone attempts to go to allowing to leave message then goes out.

## 2017-02-16 DIAGNOSIS — L89610 Pressure ulcer of right heel, unstageable: Secondary | ICD-10-CM | POA: Diagnosis not present

## 2017-02-16 DIAGNOSIS — I69391 Dysphagia following cerebral infarction: Secondary | ICD-10-CM | POA: Diagnosis not present

## 2017-02-16 DIAGNOSIS — Z931 Gastrostomy status: Secondary | ICD-10-CM | POA: Diagnosis not present

## 2017-02-16 DIAGNOSIS — I509 Heart failure, unspecified: Secondary | ICD-10-CM | POA: Diagnosis not present

## 2017-02-16 DIAGNOSIS — E11649 Type 2 diabetes mellitus with hypoglycemia without coma: Secondary | ICD-10-CM | POA: Diagnosis not present

## 2017-02-16 DIAGNOSIS — G40909 Epilepsy, unspecified, not intractable, without status epilepticus: Secondary | ICD-10-CM | POA: Diagnosis not present

## 2017-02-16 DIAGNOSIS — D649 Anemia, unspecified: Secondary | ICD-10-CM | POA: Diagnosis not present

## 2017-02-16 DIAGNOSIS — I69351 Hemiplegia and hemiparesis following cerebral infarction affecting right dominant side: Secondary | ICD-10-CM | POA: Diagnosis not present

## 2017-02-16 DIAGNOSIS — I110 Hypertensive heart disease with heart failure: Secondary | ICD-10-CM | POA: Diagnosis not present

## 2017-02-16 DIAGNOSIS — Z7901 Long term (current) use of anticoagulants: Secondary | ICD-10-CM | POA: Diagnosis not present

## 2017-02-16 DIAGNOSIS — L89892 Pressure ulcer of other site, stage 2: Secondary | ICD-10-CM | POA: Diagnosis not present

## 2017-02-16 DIAGNOSIS — F329 Major depressive disorder, single episode, unspecified: Secondary | ICD-10-CM | POA: Diagnosis not present

## 2017-02-18 DIAGNOSIS — I69351 Hemiplegia and hemiparesis following cerebral infarction affecting right dominant side: Secondary | ICD-10-CM | POA: Diagnosis not present

## 2017-02-18 DIAGNOSIS — L89892 Pressure ulcer of other site, stage 2: Secondary | ICD-10-CM | POA: Diagnosis not present

## 2017-02-18 DIAGNOSIS — E11649 Type 2 diabetes mellitus with hypoglycemia without coma: Secondary | ICD-10-CM | POA: Diagnosis not present

## 2017-02-18 DIAGNOSIS — I110 Hypertensive heart disease with heart failure: Secondary | ICD-10-CM | POA: Diagnosis not present

## 2017-02-18 DIAGNOSIS — I509 Heart failure, unspecified: Secondary | ICD-10-CM | POA: Diagnosis not present

## 2017-02-18 DIAGNOSIS — L89610 Pressure ulcer of right heel, unstageable: Secondary | ICD-10-CM | POA: Diagnosis not present

## 2017-02-19 DIAGNOSIS — L89892 Pressure ulcer of other site, stage 2: Secondary | ICD-10-CM | POA: Diagnosis not present

## 2017-02-19 DIAGNOSIS — I69351 Hemiplegia and hemiparesis following cerebral infarction affecting right dominant side: Secondary | ICD-10-CM | POA: Diagnosis not present

## 2017-02-19 DIAGNOSIS — L89610 Pressure ulcer of right heel, unstageable: Secondary | ICD-10-CM | POA: Diagnosis not present

## 2017-02-19 DIAGNOSIS — E11649 Type 2 diabetes mellitus with hypoglycemia without coma: Secondary | ICD-10-CM | POA: Diagnosis not present

## 2017-02-19 DIAGNOSIS — I110 Hypertensive heart disease with heart failure: Secondary | ICD-10-CM | POA: Diagnosis not present

## 2017-02-19 DIAGNOSIS — I509 Heart failure, unspecified: Secondary | ICD-10-CM | POA: Diagnosis not present

## 2017-02-20 DIAGNOSIS — L89892 Pressure ulcer of other site, stage 2: Secondary | ICD-10-CM | POA: Diagnosis not present

## 2017-02-20 DIAGNOSIS — E11649 Type 2 diabetes mellitus with hypoglycemia without coma: Secondary | ICD-10-CM | POA: Diagnosis not present

## 2017-02-20 DIAGNOSIS — I110 Hypertensive heart disease with heart failure: Secondary | ICD-10-CM | POA: Diagnosis not present

## 2017-02-20 DIAGNOSIS — I509 Heart failure, unspecified: Secondary | ICD-10-CM | POA: Diagnosis not present

## 2017-02-20 DIAGNOSIS — I69351 Hemiplegia and hemiparesis following cerebral infarction affecting right dominant side: Secondary | ICD-10-CM | POA: Diagnosis not present

## 2017-02-20 DIAGNOSIS — L89610 Pressure ulcer of right heel, unstageable: Secondary | ICD-10-CM | POA: Diagnosis not present

## 2017-02-23 DIAGNOSIS — L89892 Pressure ulcer of other site, stage 2: Secondary | ICD-10-CM | POA: Diagnosis not present

## 2017-02-23 DIAGNOSIS — I69351 Hemiplegia and hemiparesis following cerebral infarction affecting right dominant side: Secondary | ICD-10-CM | POA: Diagnosis not present

## 2017-02-23 DIAGNOSIS — E11649 Type 2 diabetes mellitus with hypoglycemia without coma: Secondary | ICD-10-CM | POA: Diagnosis not present

## 2017-02-23 DIAGNOSIS — L89610 Pressure ulcer of right heel, unstageable: Secondary | ICD-10-CM | POA: Diagnosis not present

## 2017-02-23 DIAGNOSIS — I509 Heart failure, unspecified: Secondary | ICD-10-CM | POA: Diagnosis not present

## 2017-02-23 DIAGNOSIS — I110 Hypertensive heart disease with heart failure: Secondary | ICD-10-CM | POA: Diagnosis not present

## 2017-02-25 DIAGNOSIS — E11649 Type 2 diabetes mellitus with hypoglycemia without coma: Secondary | ICD-10-CM | POA: Diagnosis not present

## 2017-02-25 DIAGNOSIS — I69351 Hemiplegia and hemiparesis following cerebral infarction affecting right dominant side: Secondary | ICD-10-CM | POA: Diagnosis not present

## 2017-02-25 DIAGNOSIS — I509 Heart failure, unspecified: Secondary | ICD-10-CM | POA: Diagnosis not present

## 2017-02-25 DIAGNOSIS — L89610 Pressure ulcer of right heel, unstageable: Secondary | ICD-10-CM | POA: Diagnosis not present

## 2017-02-25 DIAGNOSIS — I110 Hypertensive heart disease with heart failure: Secondary | ICD-10-CM | POA: Diagnosis not present

## 2017-02-25 DIAGNOSIS — L89892 Pressure ulcer of other site, stage 2: Secondary | ICD-10-CM | POA: Diagnosis not present

## 2017-02-26 DIAGNOSIS — Z7901 Long term (current) use of anticoagulants: Secondary | ICD-10-CM | POA: Diagnosis not present

## 2017-02-26 DIAGNOSIS — I5022 Chronic systolic (congestive) heart failure: Secondary | ICD-10-CM | POA: Diagnosis not present

## 2017-02-26 DIAGNOSIS — L8990 Pressure ulcer of unspecified site, unspecified stage: Secondary | ICD-10-CM | POA: Diagnosis not present

## 2017-02-26 DIAGNOSIS — Z86718 Personal history of other venous thrombosis and embolism: Secondary | ICD-10-CM | POA: Diagnosis not present

## 2017-02-26 DIAGNOSIS — E119 Type 2 diabetes mellitus without complications: Secondary | ICD-10-CM | POA: Diagnosis not present

## 2017-02-27 DIAGNOSIS — I110 Hypertensive heart disease with heart failure: Secondary | ICD-10-CM | POA: Diagnosis not present

## 2017-02-27 DIAGNOSIS — L89892 Pressure ulcer of other site, stage 2: Secondary | ICD-10-CM | POA: Diagnosis not present

## 2017-02-27 DIAGNOSIS — E11649 Type 2 diabetes mellitus with hypoglycemia without coma: Secondary | ICD-10-CM | POA: Diagnosis not present

## 2017-02-27 DIAGNOSIS — L89610 Pressure ulcer of right heel, unstageable: Secondary | ICD-10-CM | POA: Diagnosis not present

## 2017-02-27 DIAGNOSIS — I69351 Hemiplegia and hemiparesis following cerebral infarction affecting right dominant side: Secondary | ICD-10-CM | POA: Diagnosis not present

## 2017-02-27 DIAGNOSIS — I509 Heart failure, unspecified: Secondary | ICD-10-CM | POA: Diagnosis not present

## 2017-03-03 DIAGNOSIS — Z951 Presence of aortocoronary bypass graft: Secondary | ICD-10-CM | POA: Diagnosis not present

## 2017-03-03 DIAGNOSIS — Z2239 Carrier of other specified bacterial diseases: Secondary | ICD-10-CM | POA: Diagnosis not present

## 2017-03-03 DIAGNOSIS — N390 Urinary tract infection, site not specified: Secondary | ICD-10-CM | POA: Diagnosis not present

## 2017-03-03 DIAGNOSIS — L89312 Pressure ulcer of right buttock, stage 2: Secondary | ICD-10-CM | POA: Diagnosis present

## 2017-03-03 DIAGNOSIS — G4089 Other seizures: Secondary | ICD-10-CM | POA: Diagnosis not present

## 2017-03-03 DIAGNOSIS — E11649 Type 2 diabetes mellitus with hypoglycemia without coma: Secondary | ICD-10-CM | POA: Diagnosis not present

## 2017-03-03 DIAGNOSIS — T83511A Infection and inflammatory reaction due to indwelling urethral catheter, initial encounter: Secondary | ICD-10-CM | POA: Diagnosis not present

## 2017-03-03 DIAGNOSIS — I69351 Hemiplegia and hemiparesis following cerebral infarction affecting right dominant side: Secondary | ICD-10-CM | POA: Diagnosis not present

## 2017-03-03 DIAGNOSIS — R569 Unspecified convulsions: Secondary | ICD-10-CM | POA: Diagnosis not present

## 2017-03-03 DIAGNOSIS — Z794 Long term (current) use of insulin: Secondary | ICD-10-CM | POA: Diagnosis not present

## 2017-03-03 DIAGNOSIS — L89892 Pressure ulcer of other site, stage 2: Secondary | ICD-10-CM | POA: Diagnosis not present

## 2017-03-03 DIAGNOSIS — I509 Heart failure, unspecified: Secondary | ICD-10-CM | POA: Diagnosis present

## 2017-03-03 DIAGNOSIS — J9811 Atelectasis: Secondary | ICD-10-CM | POA: Diagnosis not present

## 2017-03-03 DIAGNOSIS — R4789 Other speech disturbances: Secondary | ICD-10-CM | POA: Diagnosis not present

## 2017-03-03 DIAGNOSIS — Z7901 Long term (current) use of anticoagulants: Secondary | ICD-10-CM | POA: Diagnosis not present

## 2017-03-03 DIAGNOSIS — Z8679 Personal history of other diseases of the circulatory system: Secondary | ICD-10-CM | POA: Diagnosis not present

## 2017-03-03 DIAGNOSIS — Y846 Urinary catheterization as the cause of abnormal reaction of the patient, or of later complication, without mention of misadventure at the time of the procedure: Secondary | ICD-10-CM | POA: Diagnosis not present

## 2017-03-03 DIAGNOSIS — E119 Type 2 diabetes mellitus without complications: Secondary | ICD-10-CM | POA: Diagnosis not present

## 2017-03-03 DIAGNOSIS — I10 Essential (primary) hypertension: Secondary | ICD-10-CM | POA: Diagnosis not present

## 2017-03-03 DIAGNOSIS — E872 Acidosis: Secondary | ICD-10-CM | POA: Diagnosis present

## 2017-03-03 DIAGNOSIS — Z903 Acquired absence of stomach [part of]: Secondary | ICD-10-CM | POA: Diagnosis not present

## 2017-03-03 DIAGNOSIS — I110 Hypertensive heart disease with heart failure: Secondary | ICD-10-CM | POA: Diagnosis present

## 2017-03-03 DIAGNOSIS — N319 Neuromuscular dysfunction of bladder, unspecified: Secondary | ICD-10-CM | POA: Diagnosis not present

## 2017-03-03 DIAGNOSIS — G40409 Other generalized epilepsy and epileptic syndromes, not intractable, without status epilepticus: Secondary | ICD-10-CM | POA: Diagnosis present

## 2017-03-03 DIAGNOSIS — I2510 Atherosclerotic heart disease of native coronary artery without angina pectoris: Secondary | ICD-10-CM | POA: Diagnosis present

## 2017-03-03 DIAGNOSIS — I6340 Cerebral infarction due to embolism of unspecified cerebral artery: Secondary | ICD-10-CM | POA: Diagnosis not present

## 2017-03-03 DIAGNOSIS — Z8639 Personal history of other endocrine, nutritional and metabolic disease: Secondary | ICD-10-CM | POA: Diagnosis not present

## 2017-03-03 DIAGNOSIS — I69398 Other sequelae of cerebral infarction: Secondary | ICD-10-CM | POA: Diagnosis not present

## 2017-03-03 DIAGNOSIS — R1310 Dysphagia, unspecified: Secondary | ICD-10-CM | POA: Diagnosis present

## 2017-03-03 DIAGNOSIS — I150 Renovascular hypertension: Secondary | ICD-10-CM | POA: Diagnosis not present

## 2017-03-03 DIAGNOSIS — Z931 Gastrostomy status: Secondary | ICD-10-CM | POA: Diagnosis not present

## 2017-03-03 DIAGNOSIS — N3000 Acute cystitis without hematuria: Secondary | ICD-10-CM | POA: Diagnosis not present

## 2017-03-03 DIAGNOSIS — Z7401 Bed confinement status: Secondary | ICD-10-CM | POA: Diagnosis not present

## 2017-03-03 DIAGNOSIS — R9401 Abnormal electroencephalogram [EEG]: Secondary | ICD-10-CM | POA: Diagnosis not present

## 2017-03-03 DIAGNOSIS — L89322 Pressure ulcer of left buttock, stage 2: Secondary | ICD-10-CM | POA: Diagnosis present

## 2017-03-03 DIAGNOSIS — L89610 Pressure ulcer of right heel, unstageable: Secondary | ICD-10-CM | POA: Diagnosis not present

## 2017-03-03 DIAGNOSIS — R64 Cachexia: Secondary | ICD-10-CM | POA: Diagnosis present

## 2017-03-03 DIAGNOSIS — I69898 Other sequelae of other cerebrovascular disease: Secondary | ICD-10-CM | POA: Diagnosis not present

## 2017-03-03 DIAGNOSIS — N318 Other neuromuscular dysfunction of bladder: Secondary | ICD-10-CM | POA: Diagnosis present

## 2017-03-03 DIAGNOSIS — I69391 Dysphagia following cerebral infarction: Secondary | ICD-10-CM | POA: Diagnosis not present

## 2017-03-03 DIAGNOSIS — Z7984 Long term (current) use of oral hypoglycemic drugs: Secondary | ICD-10-CM | POA: Diagnosis not present

## 2017-03-03 DIAGNOSIS — Z8673 Personal history of transient ischemic attack (TIA), and cerebral infarction without residual deficits: Secondary | ICD-10-CM | POA: Diagnosis not present

## 2017-03-03 DIAGNOSIS — Z87440 Personal history of urinary (tract) infections: Secondary | ICD-10-CM | POA: Diagnosis not present

## 2017-03-10 DIAGNOSIS — L89610 Pressure ulcer of right heel, unstageable: Secondary | ICD-10-CM | POA: Diagnosis not present

## 2017-03-10 DIAGNOSIS — E11649 Type 2 diabetes mellitus with hypoglycemia without coma: Secondary | ICD-10-CM | POA: Diagnosis not present

## 2017-03-10 DIAGNOSIS — I69351 Hemiplegia and hemiparesis following cerebral infarction affecting right dominant side: Secondary | ICD-10-CM | POA: Diagnosis not present

## 2017-03-10 DIAGNOSIS — L89892 Pressure ulcer of other site, stage 2: Secondary | ICD-10-CM | POA: Diagnosis not present

## 2017-03-10 DIAGNOSIS — I110 Hypertensive heart disease with heart failure: Secondary | ICD-10-CM | POA: Diagnosis not present

## 2017-03-10 DIAGNOSIS — I509 Heart failure, unspecified: Secondary | ICD-10-CM | POA: Diagnosis not present

## 2017-03-11 DIAGNOSIS — Z7901 Long term (current) use of anticoagulants: Secondary | ICD-10-CM | POA: Diagnosis not present

## 2017-03-11 DIAGNOSIS — R569 Unspecified convulsions: Secondary | ICD-10-CM | POA: Diagnosis not present

## 2017-03-11 DIAGNOSIS — L8990 Pressure ulcer of unspecified site, unspecified stage: Secondary | ICD-10-CM | POA: Diagnosis not present

## 2017-03-11 DIAGNOSIS — I5022 Chronic systolic (congestive) heart failure: Secondary | ICD-10-CM | POA: Diagnosis not present

## 2017-03-11 DIAGNOSIS — E119 Type 2 diabetes mellitus without complications: Secondary | ICD-10-CM | POA: Diagnosis not present

## 2017-03-12 DIAGNOSIS — L89610 Pressure ulcer of right heel, unstageable: Secondary | ICD-10-CM | POA: Diagnosis not present

## 2017-03-12 DIAGNOSIS — I110 Hypertensive heart disease with heart failure: Secondary | ICD-10-CM | POA: Diagnosis not present

## 2017-03-12 DIAGNOSIS — I69351 Hemiplegia and hemiparesis following cerebral infarction affecting right dominant side: Secondary | ICD-10-CM | POA: Diagnosis not present

## 2017-03-12 DIAGNOSIS — E11649 Type 2 diabetes mellitus with hypoglycemia without coma: Secondary | ICD-10-CM | POA: Diagnosis not present

## 2017-03-12 DIAGNOSIS — L89892 Pressure ulcer of other site, stage 2: Secondary | ICD-10-CM | POA: Diagnosis not present

## 2017-03-12 DIAGNOSIS — I509 Heart failure, unspecified: Secondary | ICD-10-CM | POA: Diagnosis not present

## 2017-03-13 DIAGNOSIS — I509 Heart failure, unspecified: Secondary | ICD-10-CM | POA: Diagnosis not present

## 2017-03-13 DIAGNOSIS — L89610 Pressure ulcer of right heel, unstageable: Secondary | ICD-10-CM | POA: Diagnosis not present

## 2017-03-13 DIAGNOSIS — I69351 Hemiplegia and hemiparesis following cerebral infarction affecting right dominant side: Secondary | ICD-10-CM | POA: Diagnosis not present

## 2017-03-13 DIAGNOSIS — L89892 Pressure ulcer of other site, stage 2: Secondary | ICD-10-CM | POA: Diagnosis not present

## 2017-03-13 DIAGNOSIS — I110 Hypertensive heart disease with heart failure: Secondary | ICD-10-CM | POA: Diagnosis not present

## 2017-03-13 DIAGNOSIS — E11649 Type 2 diabetes mellitus with hypoglycemia without coma: Secondary | ICD-10-CM | POA: Diagnosis not present

## 2017-03-16 DIAGNOSIS — L89892 Pressure ulcer of other site, stage 2: Secondary | ICD-10-CM | POA: Diagnosis not present

## 2017-03-16 DIAGNOSIS — B351 Tinea unguium: Secondary | ICD-10-CM | POA: Diagnosis not present

## 2017-03-16 DIAGNOSIS — I509 Heart failure, unspecified: Secondary | ICD-10-CM | POA: Diagnosis not present

## 2017-03-16 DIAGNOSIS — I69351 Hemiplegia and hemiparesis following cerebral infarction affecting right dominant side: Secondary | ICD-10-CM | POA: Diagnosis not present

## 2017-03-16 DIAGNOSIS — E0840 Diabetes mellitus due to underlying condition with diabetic neuropathy, unspecified: Secondary | ICD-10-CM | POA: Diagnosis not present

## 2017-03-16 DIAGNOSIS — I110 Hypertensive heart disease with heart failure: Secondary | ICD-10-CM | POA: Diagnosis not present

## 2017-03-16 DIAGNOSIS — E11649 Type 2 diabetes mellitus with hypoglycemia without coma: Secondary | ICD-10-CM | POA: Diagnosis not present

## 2017-03-16 DIAGNOSIS — L89610 Pressure ulcer of right heel, unstageable: Secondary | ICD-10-CM | POA: Diagnosis not present

## 2017-03-16 DIAGNOSIS — M2041 Other hammer toe(s) (acquired), right foot: Secondary | ICD-10-CM | POA: Diagnosis not present

## 2017-03-16 DIAGNOSIS — M79675 Pain in left toe(s): Secondary | ICD-10-CM | POA: Diagnosis not present

## 2017-03-18 DIAGNOSIS — L89892 Pressure ulcer of other site, stage 2: Secondary | ICD-10-CM | POA: Diagnosis not present

## 2017-03-18 DIAGNOSIS — I110 Hypertensive heart disease with heart failure: Secondary | ICD-10-CM | POA: Diagnosis not present

## 2017-03-18 DIAGNOSIS — I509 Heart failure, unspecified: Secondary | ICD-10-CM | POA: Diagnosis not present

## 2017-03-18 DIAGNOSIS — I69351 Hemiplegia and hemiparesis following cerebral infarction affecting right dominant side: Secondary | ICD-10-CM | POA: Diagnosis not present

## 2017-03-18 DIAGNOSIS — L89610 Pressure ulcer of right heel, unstageable: Secondary | ICD-10-CM | POA: Diagnosis not present

## 2017-03-18 DIAGNOSIS — E11649 Type 2 diabetes mellitus with hypoglycemia without coma: Secondary | ICD-10-CM | POA: Diagnosis not present

## 2017-03-19 ENCOUNTER — Other Ambulatory Visit: Payer: Self-pay | Admitting: Internal Medicine

## 2017-03-19 DIAGNOSIS — Z931 Gastrostomy status: Secondary | ICD-10-CM | POA: Diagnosis not present

## 2017-03-19 DIAGNOSIS — L89159 Pressure ulcer of sacral region, unspecified stage: Secondary | ICD-10-CM | POA: Diagnosis not present

## 2017-03-19 DIAGNOSIS — L89620 Pressure ulcer of left heel, unstageable: Secondary | ICD-10-CM | POA: Diagnosis not present

## 2017-03-19 DIAGNOSIS — I69391 Dysphagia following cerebral infarction: Secondary | ICD-10-CM | POA: Diagnosis not present

## 2017-03-19 DIAGNOSIS — I509 Heart failure, unspecified: Secondary | ICD-10-CM | POA: Diagnosis present

## 2017-03-19 DIAGNOSIS — R1310 Dysphagia, unspecified: Secondary | ICD-10-CM | POA: Diagnosis not present

## 2017-03-19 DIAGNOSIS — Z951 Presence of aortocoronary bypass graft: Secondary | ICD-10-CM | POA: Diagnosis not present

## 2017-03-19 DIAGNOSIS — Z1621 Resistance to vancomycin: Secondary | ICD-10-CM | POA: Diagnosis not present

## 2017-03-19 DIAGNOSIS — N390 Urinary tract infection, site not specified: Secondary | ICD-10-CM | POA: Diagnosis present

## 2017-03-19 DIAGNOSIS — L89600 Pressure ulcer of unspecified heel, unstageable: Secondary | ICD-10-CM | POA: Diagnosis not present

## 2017-03-19 DIAGNOSIS — R5381 Other malaise: Secondary | ICD-10-CM | POA: Diagnosis not present

## 2017-03-19 DIAGNOSIS — R1312 Dysphagia, oropharyngeal phase: Secondary | ICD-10-CM | POA: Diagnosis present

## 2017-03-19 DIAGNOSIS — M869 Osteomyelitis, unspecified: Secondary | ICD-10-CM | POA: Diagnosis not present

## 2017-03-19 DIAGNOSIS — Z87440 Personal history of urinary (tract) infections: Secondary | ICD-10-CM | POA: Diagnosis not present

## 2017-03-19 DIAGNOSIS — Z1624 Resistance to multiple antibiotics: Secondary | ICD-10-CM | POA: Diagnosis present

## 2017-03-19 DIAGNOSIS — T83518A Infection and inflammatory reaction due to other urinary catheter, initial encounter: Secondary | ICD-10-CM | POA: Diagnosis not present

## 2017-03-19 DIAGNOSIS — I5020 Unspecified systolic (congestive) heart failure: Secondary | ICD-10-CM | POA: Diagnosis not present

## 2017-03-19 DIAGNOSIS — L8995 Pressure ulcer of unspecified site, unstageable: Secondary | ICD-10-CM | POA: Diagnosis not present

## 2017-03-19 DIAGNOSIS — Z7982 Long term (current) use of aspirin: Secondary | ICD-10-CM | POA: Diagnosis not present

## 2017-03-19 DIAGNOSIS — I2109 ST elevation (STEMI) myocardial infarction involving other coronary artery of anterior wall: Secondary | ICD-10-CM | POA: Diagnosis not present

## 2017-03-19 DIAGNOSIS — Z515 Encounter for palliative care: Secondary | ICD-10-CM | POA: Diagnosis not present

## 2017-03-19 DIAGNOSIS — R402441 Other coma, without documented Glasgow coma scale score, or with partial score reported, in the field [EMT or ambulance]: Secondary | ICD-10-CM | POA: Diagnosis not present

## 2017-03-19 DIAGNOSIS — D649 Anemia, unspecified: Secondary | ICD-10-CM | POA: Diagnosis present

## 2017-03-19 DIAGNOSIS — R4182 Altered mental status, unspecified: Secondary | ICD-10-CM | POA: Diagnosis not present

## 2017-03-19 DIAGNOSIS — Z7401 Bed confinement status: Secondary | ICD-10-CM | POA: Diagnosis not present

## 2017-03-19 DIAGNOSIS — I517 Cardiomegaly: Secondary | ICD-10-CM | POA: Diagnosis not present

## 2017-03-19 DIAGNOSIS — E11649 Type 2 diabetes mellitus with hypoglycemia without coma: Secondary | ICD-10-CM | POA: Diagnosis not present

## 2017-03-19 DIAGNOSIS — I110 Hypertensive heart disease with heart failure: Secondary | ICD-10-CM | POA: Diagnosis present

## 2017-03-19 DIAGNOSIS — G40909 Epilepsy, unspecified, not intractable, without status epilepticus: Secondary | ICD-10-CM | POA: Diagnosis present

## 2017-03-19 DIAGNOSIS — N3000 Acute cystitis without hematuria: Secondary | ICD-10-CM | POA: Diagnosis not present

## 2017-03-19 DIAGNOSIS — I4510 Unspecified right bundle-branch block: Secondary | ICD-10-CM | POA: Diagnosis not present

## 2017-03-19 DIAGNOSIS — L089 Local infection of the skin and subcutaneous tissue, unspecified: Secondary | ICD-10-CM | POA: Diagnosis not present

## 2017-03-19 DIAGNOSIS — I25799 Atherosclerosis of other coronary artery bypass graft(s) with unspecified angina pectoris: Secondary | ICD-10-CM | POA: Diagnosis not present

## 2017-03-19 DIAGNOSIS — I69351 Hemiplegia and hemiparesis following cerebral infarction affecting right dominant side: Secondary | ICD-10-CM | POA: Diagnosis not present

## 2017-03-19 DIAGNOSIS — L89151 Pressure ulcer of sacral region, stage 1: Secondary | ICD-10-CM | POA: Diagnosis present

## 2017-03-19 DIAGNOSIS — L89610 Pressure ulcer of right heel, unstageable: Secondary | ICD-10-CM | POA: Diagnosis not present

## 2017-03-19 DIAGNOSIS — E8809 Other disorders of plasma-protein metabolism, not elsewhere classified: Secondary | ICD-10-CM | POA: Diagnosis present

## 2017-03-19 DIAGNOSIS — I2510 Atherosclerotic heart disease of native coronary artery without angina pectoris: Secondary | ICD-10-CM | POA: Diagnosis present

## 2017-03-19 DIAGNOSIS — R000 Tachycardia, unspecified: Secondary | ICD-10-CM | POA: Diagnosis not present

## 2017-03-19 DIAGNOSIS — E785 Hyperlipidemia, unspecified: Secondary | ICD-10-CM | POA: Diagnosis present

## 2017-03-19 DIAGNOSIS — Z794 Long term (current) use of insulin: Secondary | ICD-10-CM | POA: Diagnosis not present

## 2017-03-19 DIAGNOSIS — Z7901 Long term (current) use of anticoagulants: Secondary | ICD-10-CM | POA: Diagnosis not present

## 2017-03-19 DIAGNOSIS — Z960 Presence of urogenital implants: Secondary | ICD-10-CM | POA: Diagnosis not present

## 2017-03-19 DIAGNOSIS — I69321 Dysphasia following cerebral infarction: Secondary | ICD-10-CM | POA: Diagnosis not present

## 2017-03-19 DIAGNOSIS — B952 Enterococcus as the cause of diseases classified elsewhere: Secondary | ICD-10-CM | POA: Diagnosis not present

## 2017-03-19 DIAGNOSIS — Z87891 Personal history of nicotine dependence: Secondary | ICD-10-CM | POA: Diagnosis not present

## 2017-03-19 DIAGNOSIS — L89892 Pressure ulcer of other site, stage 2: Secondary | ICD-10-CM | POA: Diagnosis not present

## 2017-03-19 DIAGNOSIS — Z8673 Personal history of transient ischemic attack (TIA), and cerebral infarction without residual deficits: Secondary | ICD-10-CM | POA: Diagnosis not present

## 2017-03-19 DIAGNOSIS — R509 Fever, unspecified: Secondary | ICD-10-CM | POA: Diagnosis not present

## 2017-03-19 DIAGNOSIS — L8992 Pressure ulcer of unspecified site, stage 2: Secondary | ICD-10-CM | POA: Diagnosis not present

## 2017-03-19 DIAGNOSIS — A419 Sepsis, unspecified organism: Secondary | ICD-10-CM | POA: Diagnosis not present

## 2017-03-19 DIAGNOSIS — B9620 Unspecified Escherichia coli [E. coli] as the cause of diseases classified elsewhere: Secondary | ICD-10-CM | POA: Diagnosis not present

## 2017-03-19 DIAGNOSIS — Z7409 Other reduced mobility: Secondary | ICD-10-CM | POA: Diagnosis not present

## 2017-03-27 DIAGNOSIS — I110 Hypertensive heart disease with heart failure: Secondary | ICD-10-CM | POA: Diagnosis not present

## 2017-03-27 DIAGNOSIS — I509 Heart failure, unspecified: Secondary | ICD-10-CM | POA: Diagnosis not present

## 2017-03-27 DIAGNOSIS — L89610 Pressure ulcer of right heel, unstageable: Secondary | ICD-10-CM | POA: Diagnosis not present

## 2017-03-27 DIAGNOSIS — E11649 Type 2 diabetes mellitus with hypoglycemia without coma: Secondary | ICD-10-CM | POA: Diagnosis not present

## 2017-03-27 DIAGNOSIS — L89892 Pressure ulcer of other site, stage 2: Secondary | ICD-10-CM | POA: Diagnosis not present

## 2017-03-27 DIAGNOSIS — I69351 Hemiplegia and hemiparesis following cerebral infarction affecting right dominant side: Secondary | ICD-10-CM | POA: Diagnosis not present

## 2017-03-31 DIAGNOSIS — E11649 Type 2 diabetes mellitus with hypoglycemia without coma: Secondary | ICD-10-CM | POA: Diagnosis not present

## 2017-03-31 DIAGNOSIS — N390 Urinary tract infection, site not specified: Secondary | ICD-10-CM | POA: Diagnosis not present

## 2017-03-31 DIAGNOSIS — Z7401 Bed confinement status: Secondary | ICD-10-CM | POA: Diagnosis not present

## 2017-03-31 DIAGNOSIS — R0789 Other chest pain: Secondary | ICD-10-CM | POA: Diagnosis not present

## 2017-03-31 DIAGNOSIS — Z951 Presence of aortocoronary bypass graft: Secondary | ICD-10-CM | POA: Diagnosis not present

## 2017-03-31 DIAGNOSIS — I509 Heart failure, unspecified: Secondary | ICD-10-CM | POA: Diagnosis not present

## 2017-03-31 DIAGNOSIS — E119 Type 2 diabetes mellitus without complications: Secondary | ICD-10-CM | POA: Diagnosis not present

## 2017-03-31 DIAGNOSIS — I4510 Unspecified right bundle-branch block: Secondary | ICD-10-CM | POA: Diagnosis not present

## 2017-03-31 DIAGNOSIS — M62422 Contracture of muscle, left upper arm: Secondary | ICD-10-CM | POA: Diagnosis not present

## 2017-03-31 DIAGNOSIS — I110 Hypertensive heart disease with heart failure: Secondary | ICD-10-CM | POA: Diagnosis not present

## 2017-03-31 DIAGNOSIS — Z87891 Personal history of nicotine dependence: Secondary | ICD-10-CM | POA: Diagnosis not present

## 2017-03-31 DIAGNOSIS — Z8673 Personal history of transient ischemic attack (TIA), and cerebral infarction without residual deficits: Secondary | ICD-10-CM | POA: Diagnosis not present

## 2017-03-31 DIAGNOSIS — I2510 Atherosclerotic heart disease of native coronary artery without angina pectoris: Secondary | ICD-10-CM | POA: Diagnosis not present

## 2017-03-31 DIAGNOSIS — R05 Cough: Secondary | ICD-10-CM | POA: Diagnosis not present

## 2017-03-31 DIAGNOSIS — R072 Precordial pain: Secondary | ICD-10-CM | POA: Diagnosis not present

## 2017-03-31 DIAGNOSIS — R079 Chest pain, unspecified: Secondary | ICD-10-CM | POA: Diagnosis not present

## 2017-03-31 DIAGNOSIS — L89610 Pressure ulcer of right heel, unstageable: Secondary | ICD-10-CM | POA: Diagnosis not present

## 2017-03-31 DIAGNOSIS — L89892 Pressure ulcer of other site, stage 2: Secondary | ICD-10-CM | POA: Diagnosis not present

## 2017-03-31 DIAGNOSIS — S81801A Unspecified open wound, right lower leg, initial encounter: Secondary | ICD-10-CM | POA: Diagnosis not present

## 2017-03-31 DIAGNOSIS — I69351 Hemiplegia and hemiparesis following cerebral infarction affecting right dominant side: Secondary | ICD-10-CM | POA: Diagnosis not present

## 2017-04-07 DIAGNOSIS — L89610 Pressure ulcer of right heel, unstageable: Secondary | ICD-10-CM | POA: Diagnosis not present

## 2017-04-07 DIAGNOSIS — L89892 Pressure ulcer of other site, stage 2: Secondary | ICD-10-CM | POA: Diagnosis not present

## 2017-04-07 DIAGNOSIS — I69351 Hemiplegia and hemiparesis following cerebral infarction affecting right dominant side: Secondary | ICD-10-CM | POA: Diagnosis not present

## 2017-04-07 DIAGNOSIS — I110 Hypertensive heart disease with heart failure: Secondary | ICD-10-CM | POA: Diagnosis not present

## 2017-04-07 DIAGNOSIS — I509 Heart failure, unspecified: Secondary | ICD-10-CM | POA: Diagnosis not present

## 2017-04-07 DIAGNOSIS — E11649 Type 2 diabetes mellitus with hypoglycemia without coma: Secondary | ICD-10-CM | POA: Diagnosis not present

## 2017-04-14 DIAGNOSIS — I110 Hypertensive heart disease with heart failure: Secondary | ICD-10-CM | POA: Diagnosis not present

## 2017-04-14 DIAGNOSIS — L89610 Pressure ulcer of right heel, unstageable: Secondary | ICD-10-CM | POA: Diagnosis not present

## 2017-04-14 DIAGNOSIS — E11649 Type 2 diabetes mellitus with hypoglycemia without coma: Secondary | ICD-10-CM | POA: Diagnosis not present

## 2017-04-14 DIAGNOSIS — I509 Heart failure, unspecified: Secondary | ICD-10-CM | POA: Diagnosis not present

## 2017-04-14 DIAGNOSIS — L89892 Pressure ulcer of other site, stage 2: Secondary | ICD-10-CM | POA: Diagnosis not present

## 2017-04-14 DIAGNOSIS — I69351 Hemiplegia and hemiparesis following cerebral infarction affecting right dominant side: Secondary | ICD-10-CM | POA: Diagnosis not present

## 2017-04-17 DIAGNOSIS — D649 Anemia, unspecified: Secondary | ICD-10-CM | POA: Diagnosis not present

## 2017-04-17 DIAGNOSIS — F329 Major depressive disorder, single episode, unspecified: Secondary | ICD-10-CM | POA: Diagnosis not present

## 2017-04-17 DIAGNOSIS — Z931 Gastrostomy status: Secondary | ICD-10-CM | POA: Diagnosis not present

## 2017-04-17 DIAGNOSIS — L89892 Pressure ulcer of other site, stage 2: Secondary | ICD-10-CM | POA: Diagnosis not present

## 2017-04-17 DIAGNOSIS — I10 Essential (primary) hypertension: Secondary | ICD-10-CM | POA: Diagnosis not present

## 2017-04-17 DIAGNOSIS — Z87440 Personal history of urinary (tract) infections: Secondary | ICD-10-CM | POA: Diagnosis not present

## 2017-04-17 DIAGNOSIS — E119 Type 2 diabetes mellitus without complications: Secondary | ICD-10-CM | POA: Diagnosis not present

## 2017-04-17 DIAGNOSIS — L8990 Pressure ulcer of unspecified site, unspecified stage: Secondary | ICD-10-CM | POA: Diagnosis not present

## 2017-04-17 DIAGNOSIS — I69391 Dysphagia following cerebral infarction: Secondary | ICD-10-CM | POA: Diagnosis not present

## 2017-04-17 DIAGNOSIS — I110 Hypertensive heart disease with heart failure: Secondary | ICD-10-CM | POA: Diagnosis not present

## 2017-04-17 DIAGNOSIS — E11649 Type 2 diabetes mellitus with hypoglycemia without coma: Secondary | ICD-10-CM | POA: Diagnosis not present

## 2017-04-17 DIAGNOSIS — L89152 Pressure ulcer of sacral region, stage 2: Secondary | ICD-10-CM | POA: Diagnosis not present

## 2017-04-17 DIAGNOSIS — I69398 Other sequelae of cerebral infarction: Secondary | ICD-10-CM | POA: Diagnosis not present

## 2017-04-17 DIAGNOSIS — M79602 Pain in left arm: Secondary | ICD-10-CM | POA: Diagnosis not present

## 2017-04-17 DIAGNOSIS — Z7901 Long term (current) use of anticoagulants: Secondary | ICD-10-CM | POA: Diagnosis not present

## 2017-04-17 DIAGNOSIS — R569 Unspecified convulsions: Secondary | ICD-10-CM | POA: Diagnosis not present

## 2017-04-17 DIAGNOSIS — I5022 Chronic systolic (congestive) heart failure: Secondary | ICD-10-CM | POA: Diagnosis not present

## 2017-04-17 DIAGNOSIS — I2510 Atherosclerotic heart disease of native coronary artery without angina pectoris: Secondary | ICD-10-CM | POA: Diagnosis not present

## 2017-04-17 DIAGNOSIS — I69351 Hemiplegia and hemiparesis following cerebral infarction affecting right dominant side: Secondary | ICD-10-CM | POA: Diagnosis not present

## 2017-04-17 DIAGNOSIS — I509 Heart failure, unspecified: Secondary | ICD-10-CM | POA: Diagnosis not present

## 2017-04-20 DIAGNOSIS — I2510 Atherosclerotic heart disease of native coronary artery without angina pectoris: Secondary | ICD-10-CM | POA: Diagnosis not present

## 2017-04-20 DIAGNOSIS — L89152 Pressure ulcer of sacral region, stage 2: Secondary | ICD-10-CM | POA: Diagnosis not present

## 2017-04-20 DIAGNOSIS — E11649 Type 2 diabetes mellitus with hypoglycemia without coma: Secondary | ICD-10-CM | POA: Diagnosis not present

## 2017-04-20 DIAGNOSIS — I110 Hypertensive heart disease with heart failure: Secondary | ICD-10-CM | POA: Diagnosis not present

## 2017-04-20 DIAGNOSIS — I69351 Hemiplegia and hemiparesis following cerebral infarction affecting right dominant side: Secondary | ICD-10-CM | POA: Diagnosis not present

## 2017-04-20 DIAGNOSIS — I509 Heart failure, unspecified: Secondary | ICD-10-CM | POA: Diagnosis not present

## 2017-05-01 DIAGNOSIS — E11649 Type 2 diabetes mellitus with hypoglycemia without coma: Secondary | ICD-10-CM | POA: Diagnosis not present

## 2017-05-01 DIAGNOSIS — L89152 Pressure ulcer of sacral region, stage 2: Secondary | ICD-10-CM | POA: Diagnosis not present

## 2017-05-01 DIAGNOSIS — I110 Hypertensive heart disease with heart failure: Secondary | ICD-10-CM | POA: Diagnosis not present

## 2017-05-01 DIAGNOSIS — I2510 Atherosclerotic heart disease of native coronary artery without angina pectoris: Secondary | ICD-10-CM | POA: Diagnosis not present

## 2017-05-01 DIAGNOSIS — I509 Heart failure, unspecified: Secondary | ICD-10-CM | POA: Diagnosis not present

## 2017-05-01 DIAGNOSIS — I69351 Hemiplegia and hemiparesis following cerebral infarction affecting right dominant side: Secondary | ICD-10-CM | POA: Diagnosis not present

## 2017-05-07 DIAGNOSIS — E11649 Type 2 diabetes mellitus with hypoglycemia without coma: Secondary | ICD-10-CM | POA: Diagnosis not present

## 2017-05-07 DIAGNOSIS — I2510 Atherosclerotic heart disease of native coronary artery without angina pectoris: Secondary | ICD-10-CM | POA: Diagnosis not present

## 2017-05-07 DIAGNOSIS — I110 Hypertensive heart disease with heart failure: Secondary | ICD-10-CM | POA: Diagnosis not present

## 2017-05-07 DIAGNOSIS — L89152 Pressure ulcer of sacral region, stage 2: Secondary | ICD-10-CM | POA: Diagnosis not present

## 2017-05-07 DIAGNOSIS — I69351 Hemiplegia and hemiparesis following cerebral infarction affecting right dominant side: Secondary | ICD-10-CM | POA: Diagnosis not present

## 2017-05-07 DIAGNOSIS — I509 Heart failure, unspecified: Secondary | ICD-10-CM | POA: Diagnosis not present

## 2017-05-12 DIAGNOSIS — I110 Hypertensive heart disease with heart failure: Secondary | ICD-10-CM | POA: Diagnosis not present

## 2017-05-12 DIAGNOSIS — I2510 Atherosclerotic heart disease of native coronary artery without angina pectoris: Secondary | ICD-10-CM | POA: Diagnosis not present

## 2017-05-12 DIAGNOSIS — I509 Heart failure, unspecified: Secondary | ICD-10-CM | POA: Diagnosis not present

## 2017-05-12 DIAGNOSIS — L89152 Pressure ulcer of sacral region, stage 2: Secondary | ICD-10-CM | POA: Diagnosis not present

## 2017-05-12 DIAGNOSIS — E11649 Type 2 diabetes mellitus with hypoglycemia without coma: Secondary | ICD-10-CM | POA: Diagnosis not present

## 2017-05-12 DIAGNOSIS — I69351 Hemiplegia and hemiparesis following cerebral infarction affecting right dominant side: Secondary | ICD-10-CM | POA: Diagnosis not present

## 2017-05-21 DIAGNOSIS — L8990 Pressure ulcer of unspecified site, unspecified stage: Secondary | ICD-10-CM | POA: Diagnosis not present

## 2017-05-21 DIAGNOSIS — Z7901 Long term (current) use of anticoagulants: Secondary | ICD-10-CM | POA: Diagnosis not present

## 2017-05-21 DIAGNOSIS — I10 Essential (primary) hypertension: Secondary | ICD-10-CM | POA: Diagnosis not present

## 2017-05-21 DIAGNOSIS — E119 Type 2 diabetes mellitus without complications: Secondary | ICD-10-CM | POA: Diagnosis not present

## 2017-05-21 DIAGNOSIS — I5022 Chronic systolic (congestive) heart failure: Secondary | ICD-10-CM | POA: Diagnosis not present

## 2017-05-28 DIAGNOSIS — L89152 Pressure ulcer of sacral region, stage 2: Secondary | ICD-10-CM | POA: Diagnosis not present

## 2017-05-28 DIAGNOSIS — I110 Hypertensive heart disease with heart failure: Secondary | ICD-10-CM | POA: Diagnosis not present

## 2017-05-28 DIAGNOSIS — I509 Heart failure, unspecified: Secondary | ICD-10-CM | POA: Diagnosis not present

## 2017-05-28 DIAGNOSIS — I2510 Atherosclerotic heart disease of native coronary artery without angina pectoris: Secondary | ICD-10-CM | POA: Diagnosis not present

## 2017-05-28 DIAGNOSIS — I69351 Hemiplegia and hemiparesis following cerebral infarction affecting right dominant side: Secondary | ICD-10-CM | POA: Diagnosis not present

## 2017-05-28 DIAGNOSIS — E11649 Type 2 diabetes mellitus with hypoglycemia without coma: Secondary | ICD-10-CM | POA: Diagnosis not present

## 2017-05-29 DIAGNOSIS — E875 Hyperkalemia: Secondary | ICD-10-CM | POA: Diagnosis present

## 2017-05-29 DIAGNOSIS — L8990 Pressure ulcer of unspecified site, unspecified stage: Secondary | ICD-10-CM | POA: Diagnosis not present

## 2017-05-29 DIAGNOSIS — L89619 Pressure ulcer of right heel, unspecified stage: Secondary | ICD-10-CM | POA: Diagnosis not present

## 2017-05-29 DIAGNOSIS — L89322 Pressure ulcer of left buttock, stage 2: Secondary | ICD-10-CM | POA: Diagnosis present

## 2017-05-29 DIAGNOSIS — E1169 Type 2 diabetes mellitus with other specified complication: Secondary | ICD-10-CM | POA: Diagnosis not present

## 2017-05-29 DIAGNOSIS — L89159 Pressure ulcer of sacral region, unspecified stage: Secondary | ICD-10-CM | POA: Diagnosis not present

## 2017-05-29 DIAGNOSIS — M6281 Muscle weakness (generalized): Secondary | ICD-10-CM | POA: Diagnosis not present

## 2017-05-29 DIAGNOSIS — M62441 Contracture of muscle, right hand: Secondary | ICD-10-CM | POA: Diagnosis not present

## 2017-05-29 DIAGNOSIS — I69398 Other sequelae of cerebral infarction: Secondary | ICD-10-CM | POA: Diagnosis not present

## 2017-05-29 DIAGNOSIS — N3000 Acute cystitis without hematuria: Secondary | ICD-10-CM | POA: Diagnosis present

## 2017-05-29 DIAGNOSIS — G40909 Epilepsy, unspecified, not intractable, without status epilepticus: Secondary | ICD-10-CM | POA: Diagnosis not present

## 2017-05-29 DIAGNOSIS — I69359 Hemiplegia and hemiparesis following cerebral infarction affecting unspecified side: Secondary | ICD-10-CM | POA: Diagnosis not present

## 2017-05-29 DIAGNOSIS — E872 Acidosis: Secondary | ICD-10-CM | POA: Diagnosis not present

## 2017-05-29 DIAGNOSIS — Z1612 Extended spectrum beta lactamase (ESBL) resistance: Secondary | ICD-10-CM | POA: Diagnosis present

## 2017-05-29 DIAGNOSIS — F809 Developmental disorder of speech and language, unspecified: Secondary | ICD-10-CM | POA: Diagnosis not present

## 2017-05-29 DIAGNOSIS — I110 Hypertensive heart disease with heart failure: Secondary | ICD-10-CM | POA: Diagnosis not present

## 2017-05-29 DIAGNOSIS — R7989 Other specified abnormal findings of blood chemistry: Secondary | ICD-10-CM | POA: Diagnosis not present

## 2017-05-29 DIAGNOSIS — N390 Urinary tract infection, site not specified: Secondary | ICD-10-CM | POA: Diagnosis not present

## 2017-05-29 DIAGNOSIS — E785 Hyperlipidemia, unspecified: Secondary | ICD-10-CM | POA: Diagnosis present

## 2017-05-29 DIAGNOSIS — E871 Hypo-osmolality and hyponatremia: Secondary | ICD-10-CM | POA: Diagnosis not present

## 2017-05-29 DIAGNOSIS — R748 Abnormal levels of other serum enzymes: Secondary | ICD-10-CM | POA: Diagnosis present

## 2017-05-29 DIAGNOSIS — L89150 Pressure ulcer of sacral region, unstageable: Secondary | ICD-10-CM | POA: Diagnosis present

## 2017-05-29 DIAGNOSIS — L89892 Pressure ulcer of other site, stage 2: Secondary | ICD-10-CM | POA: Diagnosis not present

## 2017-05-29 DIAGNOSIS — L89609 Pressure ulcer of unspecified heel, unspecified stage: Secondary | ICD-10-CM | POA: Diagnosis not present

## 2017-05-29 DIAGNOSIS — R29898 Other symptoms and signs involving the musculoskeletal system: Secondary | ICD-10-CM | POA: Diagnosis not present

## 2017-05-29 DIAGNOSIS — G8250 Quadriplegia, unspecified: Secondary | ICD-10-CM | POA: Diagnosis present

## 2017-05-29 DIAGNOSIS — R5381 Other malaise: Secondary | ICD-10-CM | POA: Diagnosis present

## 2017-05-29 DIAGNOSIS — Z7901 Long term (current) use of anticoagulants: Secondary | ICD-10-CM | POA: Diagnosis not present

## 2017-05-29 DIAGNOSIS — I82401 Acute embolism and thrombosis of unspecified deep veins of right lower extremity: Secondary | ICD-10-CM | POA: Diagnosis not present

## 2017-05-29 DIAGNOSIS — Z794 Long term (current) use of insulin: Secondary | ICD-10-CM | POA: Diagnosis not present

## 2017-05-29 DIAGNOSIS — R4182 Altered mental status, unspecified: Secondary | ICD-10-CM | POA: Diagnosis not present

## 2017-05-29 DIAGNOSIS — R778 Other specified abnormalities of plasma proteins: Secondary | ICD-10-CM | POA: Diagnosis not present

## 2017-05-29 DIAGNOSIS — M62442 Contracture of muscle, left hand: Secondary | ICD-10-CM | POA: Diagnosis not present

## 2017-05-29 DIAGNOSIS — I10 Essential (primary) hypertension: Secondary | ICD-10-CM | POA: Diagnosis not present

## 2017-05-29 DIAGNOSIS — R339 Retention of urine, unspecified: Secondary | ICD-10-CM | POA: Diagnosis not present

## 2017-05-29 DIAGNOSIS — D72829 Elevated white blood cell count, unspecified: Secondary | ICD-10-CM | POA: Diagnosis not present

## 2017-05-29 DIAGNOSIS — R569 Unspecified convulsions: Secondary | ICD-10-CM | POA: Diagnosis not present

## 2017-05-29 DIAGNOSIS — R1310 Dysphagia, unspecified: Secondary | ICD-10-CM | POA: Diagnosis present

## 2017-05-29 DIAGNOSIS — L89893 Pressure ulcer of other site, stage 3: Secondary | ICD-10-CM | POA: Diagnosis not present

## 2017-05-29 DIAGNOSIS — A4151 Sepsis due to Escherichia coli [E. coli]: Secondary | ICD-10-CM | POA: Diagnosis present

## 2017-05-29 DIAGNOSIS — I69391 Dysphagia following cerebral infarction: Secondary | ICD-10-CM | POA: Diagnosis not present

## 2017-05-29 DIAGNOSIS — A419 Sepsis, unspecified organism: Secondary | ICD-10-CM | POA: Diagnosis not present

## 2017-05-29 DIAGNOSIS — E118 Type 2 diabetes mellitus with unspecified complications: Secondary | ICD-10-CM | POA: Diagnosis not present

## 2017-05-29 DIAGNOSIS — Z7401 Bed confinement status: Secondary | ICD-10-CM | POA: Diagnosis not present

## 2017-05-29 DIAGNOSIS — I5022 Chronic systolic (congestive) heart failure: Secondary | ICD-10-CM | POA: Diagnosis not present

## 2017-05-29 DIAGNOSIS — F0150 Vascular dementia without behavioral disturbance: Secondary | ICD-10-CM | POA: Diagnosis not present

## 2017-05-29 DIAGNOSIS — M6240 Contracture of muscle, unspecified site: Secondary | ICD-10-CM | POA: Diagnosis not present

## 2017-05-29 DIAGNOSIS — L89312 Pressure ulcer of right buttock, stage 2: Secondary | ICD-10-CM | POA: Diagnosis present

## 2017-05-29 DIAGNOSIS — I69351 Hemiplegia and hemiparesis following cerebral infarction affecting right dominant side: Secondary | ICD-10-CM | POA: Diagnosis not present

## 2017-05-29 DIAGNOSIS — Z86718 Personal history of other venous thrombosis and embolism: Secondary | ICD-10-CM | POA: Diagnosis not present

## 2017-05-29 DIAGNOSIS — R1312 Dysphagia, oropharyngeal phase: Secondary | ICD-10-CM | POA: Diagnosis not present

## 2017-05-29 DIAGNOSIS — R488 Other symbolic dysfunctions: Secondary | ICD-10-CM | POA: Diagnosis not present

## 2017-05-29 DIAGNOSIS — Z452 Encounter for adjustment and management of vascular access device: Secondary | ICD-10-CM | POA: Diagnosis not present

## 2017-05-29 DIAGNOSIS — I69321 Dysphasia following cerebral infarction: Secondary | ICD-10-CM | POA: Diagnosis not present

## 2017-05-29 DIAGNOSIS — R000 Tachycardia, unspecified: Secondary | ICD-10-CM | POA: Diagnosis not present

## 2017-05-29 DIAGNOSIS — Z515 Encounter for palliative care: Secondary | ICD-10-CM | POA: Diagnosis not present

## 2017-05-29 DIAGNOSIS — L89629 Pressure ulcer of left heel, unspecified stage: Secondary | ICD-10-CM | POA: Diagnosis not present

## 2017-05-29 DIAGNOSIS — R338 Other retention of urine: Secondary | ICD-10-CM | POA: Diagnosis present

## 2017-05-29 DIAGNOSIS — L89309 Pressure ulcer of unspecified buttock, unspecified stage: Secondary | ICD-10-CM | POA: Diagnosis not present

## 2017-05-29 DIAGNOSIS — R05 Cough: Secondary | ICD-10-CM | POA: Diagnosis not present

## 2017-05-29 DIAGNOSIS — A4150 Gram-negative sepsis, unspecified: Secondary | ICD-10-CM | POA: Diagnosis not present

## 2017-05-29 DIAGNOSIS — R32 Unspecified urinary incontinence: Secondary | ICD-10-CM | POA: Diagnosis present

## 2017-05-29 DIAGNOSIS — L89329 Pressure ulcer of left buttock, unspecified stage: Secondary | ICD-10-CM | POA: Diagnosis not present

## 2017-05-29 DIAGNOSIS — I2510 Atherosclerotic heart disease of native coronary artery without angina pectoris: Secondary | ICD-10-CM | POA: Diagnosis present

## 2017-05-29 DIAGNOSIS — I69365 Other paralytic syndrome following cerebral infarction, bilateral: Secondary | ICD-10-CM | POA: Diagnosis not present

## 2017-05-29 DIAGNOSIS — E119 Type 2 diabetes mellitus without complications: Secondary | ICD-10-CM | POA: Diagnosis present

## 2017-05-29 DIAGNOSIS — R6520 Severe sepsis without septic shock: Secondary | ICD-10-CM | POA: Diagnosis not present

## 2017-05-29 DIAGNOSIS — E870 Hyperosmolality and hypernatremia: Secondary | ICD-10-CM | POA: Diagnosis present

## 2017-05-29 DIAGNOSIS — E878 Other disorders of electrolyte and fluid balance, not elsewhere classified: Secondary | ICD-10-CM | POA: Diagnosis not present

## 2017-05-29 DIAGNOSIS — Z87440 Personal history of urinary (tract) infections: Secondary | ICD-10-CM | POA: Diagnosis not present

## 2017-05-29 DIAGNOSIS — N179 Acute kidney failure, unspecified: Secondary | ICD-10-CM | POA: Diagnosis not present

## 2017-05-29 DIAGNOSIS — L89319 Pressure ulcer of right buttock, unspecified stage: Secondary | ICD-10-CM | POA: Diagnosis not present

## 2017-05-29 DIAGNOSIS — I517 Cardiomegaly: Secondary | ICD-10-CM | POA: Diagnosis not present

## 2017-05-29 DIAGNOSIS — M2450 Contracture, unspecified joint: Secondary | ICD-10-CM | POA: Diagnosis not present

## 2017-05-29 DIAGNOSIS — N289 Disorder of kidney and ureter, unspecified: Secondary | ICD-10-CM | POA: Diagnosis not present

## 2017-05-29 DIAGNOSIS — R7881 Bacteremia: Secondary | ICD-10-CM | POA: Diagnosis not present

## 2017-05-29 DIAGNOSIS — G8220 Paraplegia, unspecified: Secondary | ICD-10-CM | POA: Diagnosis not present

## 2017-06-09 DIAGNOSIS — I69391 Dysphagia following cerebral infarction: Secondary | ICD-10-CM | POA: Diagnosis not present

## 2017-06-09 DIAGNOSIS — I69351 Hemiplegia and hemiparesis following cerebral infarction affecting right dominant side: Secondary | ICD-10-CM | POA: Diagnosis not present

## 2017-06-09 DIAGNOSIS — L89894 Pressure ulcer of other site, stage 4: Secondary | ICD-10-CM | POA: Diagnosis not present

## 2017-06-09 DIAGNOSIS — Z8673 Personal history of transient ischemic attack (TIA), and cerebral infarction without residual deficits: Secondary | ICD-10-CM | POA: Diagnosis not present

## 2017-06-09 DIAGNOSIS — A4151 Sepsis due to Escherichia coli [E. coli]: Secondary | ICD-10-CM | POA: Diagnosis not present

## 2017-06-09 DIAGNOSIS — I10 Essential (primary) hypertension: Secondary | ICD-10-CM | POA: Diagnosis not present

## 2017-06-09 DIAGNOSIS — I2510 Atherosclerotic heart disease of native coronary artery without angina pectoris: Secondary | ICD-10-CM | POA: Diagnosis not present

## 2017-06-09 DIAGNOSIS — L89611 Pressure ulcer of right heel, stage 1: Secondary | ICD-10-CM | POA: Diagnosis not present

## 2017-06-09 DIAGNOSIS — I5022 Chronic systolic (congestive) heart failure: Secondary | ICD-10-CM | POA: Diagnosis not present

## 2017-06-09 DIAGNOSIS — E870 Hyperosmolality and hypernatremia: Secondary | ICD-10-CM | POA: Diagnosis not present

## 2017-06-09 DIAGNOSIS — Z0000 Encounter for general adult medical examination without abnormal findings: Secondary | ICD-10-CM | POA: Diagnosis not present

## 2017-06-09 DIAGNOSIS — G8250 Quadriplegia, unspecified: Secondary | ICD-10-CM | POA: Diagnosis not present

## 2017-06-09 DIAGNOSIS — I82401 Acute embolism and thrombosis of unspecified deep veins of right lower extremity: Secondary | ICD-10-CM | POA: Diagnosis not present

## 2017-06-09 DIAGNOSIS — E875 Hyperkalemia: Secondary | ICD-10-CM | POA: Diagnosis not present

## 2017-06-09 DIAGNOSIS — G8220 Paraplegia, unspecified: Secondary | ICD-10-CM | POA: Diagnosis not present

## 2017-06-09 DIAGNOSIS — Z931 Gastrostomy status: Secondary | ICD-10-CM | POA: Diagnosis not present

## 2017-06-09 DIAGNOSIS — Z515 Encounter for palliative care: Secondary | ICD-10-CM | POA: Diagnosis not present

## 2017-06-09 DIAGNOSIS — M62441 Contracture of muscle, right hand: Secondary | ICD-10-CM | POA: Diagnosis not present

## 2017-06-09 DIAGNOSIS — E1169 Type 2 diabetes mellitus with other specified complication: Secondary | ICD-10-CM | POA: Diagnosis not present

## 2017-06-09 DIAGNOSIS — Z86718 Personal history of other venous thrombosis and embolism: Secondary | ICD-10-CM | POA: Diagnosis not present

## 2017-06-09 DIAGNOSIS — M62442 Contracture of muscle, left hand: Secondary | ICD-10-CM | POA: Diagnosis not present

## 2017-06-09 DIAGNOSIS — A4150 Gram-negative sepsis, unspecified: Secondary | ICD-10-CM | POA: Diagnosis not present

## 2017-06-09 DIAGNOSIS — R5381 Other malaise: Secondary | ICD-10-CM | POA: Diagnosis not present

## 2017-06-09 DIAGNOSIS — B9620 Unspecified Escherichia coli [E. coli] as the cause of diseases classified elsewhere: Secondary | ICD-10-CM | POA: Diagnosis not present

## 2017-06-09 DIAGNOSIS — R569 Unspecified convulsions: Secondary | ICD-10-CM | POA: Diagnosis not present

## 2017-06-09 DIAGNOSIS — L89621 Pressure ulcer of left heel, stage 1: Secondary | ICD-10-CM | POA: Diagnosis not present

## 2017-06-09 DIAGNOSIS — L89159 Pressure ulcer of sacral region, unspecified stage: Secondary | ICD-10-CM | POA: Diagnosis not present

## 2017-06-09 DIAGNOSIS — M6281 Muscle weakness (generalized): Secondary | ICD-10-CM | POA: Diagnosis not present

## 2017-06-09 DIAGNOSIS — R1312 Dysphagia, oropharyngeal phase: Secondary | ICD-10-CM | POA: Diagnosis not present

## 2017-06-09 DIAGNOSIS — N390 Urinary tract infection, site not specified: Secondary | ICD-10-CM | POA: Diagnosis not present

## 2017-06-09 DIAGNOSIS — M6240 Contracture of muscle, unspecified site: Secondary | ICD-10-CM | POA: Diagnosis not present

## 2017-06-09 DIAGNOSIS — N3000 Acute cystitis without hematuria: Secondary | ICD-10-CM | POA: Diagnosis not present

## 2017-06-09 DIAGNOSIS — R6520 Severe sepsis without septic shock: Secondary | ICD-10-CM | POA: Diagnosis not present

## 2017-06-09 DIAGNOSIS — E785 Hyperlipidemia, unspecified: Secondary | ICD-10-CM | POA: Diagnosis not present

## 2017-06-09 DIAGNOSIS — R1310 Dysphagia, unspecified: Secondary | ICD-10-CM | POA: Diagnosis not present

## 2017-06-09 DIAGNOSIS — L89614 Pressure ulcer of right heel, stage 4: Secondary | ICD-10-CM | POA: Diagnosis not present

## 2017-06-09 DIAGNOSIS — R4189 Other symptoms and signs involving cognitive functions and awareness: Secondary | ICD-10-CM | POA: Diagnosis not present

## 2017-06-09 DIAGNOSIS — N179 Acute kidney failure, unspecified: Secondary | ICD-10-CM | POA: Diagnosis not present

## 2017-06-09 DIAGNOSIS — R7881 Bacteremia: Secondary | ICD-10-CM | POA: Diagnosis not present

## 2017-06-09 DIAGNOSIS — L89152 Pressure ulcer of sacral region, stage 2: Secondary | ICD-10-CM | POA: Diagnosis not present

## 2017-06-09 DIAGNOSIS — R4182 Altered mental status, unspecified: Secondary | ICD-10-CM | POA: Diagnosis not present

## 2017-06-09 DIAGNOSIS — L89153 Pressure ulcer of sacral region, stage 3: Secondary | ICD-10-CM | POA: Diagnosis not present

## 2017-06-09 DIAGNOSIS — R488 Other symbolic dysfunctions: Secondary | ICD-10-CM | POA: Diagnosis not present

## 2017-06-09 DIAGNOSIS — I110 Hypertensive heart disease with heart failure: Secondary | ICD-10-CM | POA: Diagnosis not present

## 2017-06-09 DIAGNOSIS — L89892 Pressure ulcer of other site, stage 2: Secondary | ICD-10-CM | POA: Diagnosis not present

## 2017-06-09 DIAGNOSIS — R778 Other specified abnormalities of plasma proteins: Secondary | ICD-10-CM | POA: Diagnosis not present

## 2017-06-10 ENCOUNTER — Encounter: Payer: Self-pay | Admitting: Internal Medicine

## 2017-06-10 ENCOUNTER — Non-Acute Institutional Stay (SKILLED_NURSING_FACILITY): Payer: Medicare Other | Admitting: Internal Medicine

## 2017-06-10 DIAGNOSIS — N390 Urinary tract infection, site not specified: Secondary | ICD-10-CM

## 2017-06-10 DIAGNOSIS — N179 Acute kidney failure, unspecified: Secondary | ICD-10-CM

## 2017-06-10 DIAGNOSIS — G8250 Quadriplegia, unspecified: Secondary | ICD-10-CM

## 2017-06-10 DIAGNOSIS — A4151 Sepsis due to Escherichia coli [E. coli]: Secondary | ICD-10-CM

## 2017-06-10 DIAGNOSIS — I10 Essential (primary) hypertension: Secondary | ICD-10-CM | POA: Diagnosis not present

## 2017-06-10 DIAGNOSIS — R569 Unspecified convulsions: Secondary | ICD-10-CM

## 2017-06-10 DIAGNOSIS — R5381 Other malaise: Secondary | ICD-10-CM | POA: Diagnosis not present

## 2017-06-10 DIAGNOSIS — R4189 Other symptoms and signs involving cognitive functions and awareness: Secondary | ICD-10-CM

## 2017-06-10 DIAGNOSIS — Z8673 Personal history of transient ischemic attack (TIA), and cerebral infarction without residual deficits: Secondary | ICD-10-CM | POA: Diagnosis not present

## 2017-06-10 DIAGNOSIS — B9620 Unspecified Escherichia coli [E. coli] as the cause of diseases classified elsewhere: Secondary | ICD-10-CM | POA: Diagnosis not present

## 2017-06-10 DIAGNOSIS — Z86718 Personal history of other venous thrombosis and embolism: Secondary | ICD-10-CM

## 2017-06-10 DIAGNOSIS — Z931 Gastrostomy status: Secondary | ICD-10-CM | POA: Diagnosis not present

## 2017-06-10 NOTE — Progress Notes (Signed)
Location:  [facility] Room Number: [phone] Place of Service:  SNF (31)  No primary care provider on file.  No care team member to display  Extended Emergency Contact Information Primary Emergency Contact: Hopkins,Alaina Address: [address]          [place] [address] [place] Home Phone: [phone] Relation: None Secondary Emergency Contact: [facility] Address: [address]          [place]  [address] Home Phone: [phone] Relation: None    Allergies: Levemir [insulin detemir]  Chief Complaint  Patient presents with  . New Admit To SNF    HPI: Patient is 67 y.o. male who is a quadriplegic, G-tube dependent, who presented to [facility] with altered mental status. There is no family patient fighting, meaningful history. Per ED staff/EMS family found the patient who is bedridden from a prior stroke gurgling in bed with decreased mental status and he was brought to the hospital for further workup. Patient was admitted to [facility] from 1/25-2/5 where he was worked up and treated for Escherichia coli sepsis due to acute cystitis with bacteremia and acute renal insufficiency with hypernatremia and hypokalemia which improved with IV fluids. Patient is admitted to skilled nursing facility for care and IV antibiotics.. While at skilled nursing facility patient will be followed for history of DVT treated with Eliquis, hypertension treated with metoprolol and Cozaar and seizures treated with Keppra.    Past Medical History:  Diagnosis Date  . Anemia   . Blindness of left eye   . CHF (congestive heart failure) (HCC)   . Depression   . Diabetes mellitus without complication (HCC)   . Hypertension   . Seizures (HCC)   . Stroke [facility])     Past Surgical History:  Procedure Laterality Date  . PEG TUBE PLACEMENT      Allergies as of 06/10/2017      Reactions   Levemir [insulin Detemir]         Medication List        Accurate as of 06/10/17 10:23 AM. Always use your most recent med list.          apixaban 5 MG Tabs tablet Commonly known as:  ELIQUIS Place 5 mg into feeding tube daily.   ciprofloxacin 0.3 % ophthalmic ointment Commonly known as:  CILOXAN Place 1 drop into the left eye daily.   ertapenem IVPB Commonly known as:  INVANZ Inject 1 g into the vein daily.   feeding supplement (GLUCERNA 1.2 CAL) Liqd Place 75 mL/hr into feeding tube continuous.   FLUoxetine 20 MG capsule Commonly known as:  PROZAC Place 10 mg into feeding tube daily.   gabapentin 300 MG capsule Commonly known as:  NEURONTIN Place 600 mg into feeding tube 3 (three) times daily.   hydrALAZINE 100 MG tablet Commonly known as:  APRESOLINE Place 50 mg into feeding tube every 8 (eight) hours.   HYDROcodone-acetaminophen 5-325 MG tablet Commonly known as:  NORCO/VICODIN Take 1 tablet by mouth every 4 (four) hours as needed for moderate pain.   isosorbide dinitrate 20 MG tablet Commonly known as:  ISORDIL Place 20 mg into feeding tube 3 (three) times daily.   ketoconazole 2 % shampoo Commonly known as:  NIZORAL Apply 1 application topically as needed for irritation.   levETIRAcetam 750 MG tablet Commonly known as:  KEPPRA Take 1,500 mg by mouth 2 (two) times daily.   losartan 25  MG tablet Commonly known as:  COZAAR Place 25 mg into feeding tube daily.   metoprolol tartrate 50 MG tablet Commonly known as:  LOPRESSOR Place 50 mg into feeding tube 2 (two) times daily.   nitroGLYCERIN 0.4 MG SL tablet Commonly known as:  NITROSTAT Place 0.4 mg under the tongue every 5 (five) minutes as needed for chest pain.   ondansetron 4 MG tablet Commonly known as:  ZOFRAN Place 4 mg into feeding tube every 8 (eight) hours as needed for nausea or vomiting.   QUEtiapine 25 MG tablet Commonly known as:  SEROQUEL Take 25 mg by mouth at bedtime.   VITAMIN D-3 PO 3,000 Units by PEG Tube  route daily.       No orders of the defined types were placed in this encounter.   Immunization History  Administered Date(s) Administered  . Influenza, Seasonal, Injecte, Preservative Fre 05/18/2013  . Influenza-Unspecified 05/18/2013    Social History   Tobacco Use  . Smoking status: Unknown If Ever Smoked  Substance Use Topics  . Alcohol use: No    Alcohol/week: 0.0 oz    Review of  systems-unable to obtain secondary to patient's decreased cognition and inability to intelligibly; per nursing no acute concerns    Vitals:   06/10/17 1000  BP: (!) 102/55  Pulse: 72  Resp: 20  Temp: 98.9 F (37.2 C)  SpO2: 99%   Body mass index is 18.84 kg/m. Physical Exam  GENERAL APPEARANCE: Alert, , but I can't understand what he is saying, No acute distress  SKIN: No diaphoresis rash HEENT: Unremarkable RESPIRATORY: Breathing is even, unlabored. Lung sounds are clear   CARDIOVASCULAR: Heart RRR no murmurs, rubs or gallops. No peripheral edema  GASTROINTESTINAL: Abdomen is soft, non-tender, not distended w/ normal bowel sounds; PEG tube  GENITOURINARY: Bladder non tender, not distended  MUSCULOSKELETAL: No abnormal joints or musculature NEUROLOGIC: Cranial nerves 2-12 grossly intact; patient moves the left arm a little but no other movement  PSYCHIATRIC: increased cognition, no behavioral issues  Patient Active Problem List   Diagnosis Date Noted  . Encounter for palliative care   . Goals of care, counseling/discussion   . Ileus (HCC)   . Acute osteomyelitis of right calcaneus (HCC)   . Hypoglycemia 02/04/2017  . Hypertension 02/04/2017  . Ankle pain, right   . Anasarca 02/03/2017  . Pressure ulcer of right heel, stage 4 (HCC) 01/29/2017  . Coagulase negative Staphylococcus bacteremia 01/29/2017  . History of CVA (cerebrovascular accident) 05/22/2015  . Hx of deep venous thrombosis 05/22/2015  . Seizures (HCC) 05/22/2015  . Depression 05/22/2015  . Diabetes  mellitus type 2, insulin dependent (HCC) 05/22/2015    CMP     Component Value Date/Time   NA 139 02/10/2017 0353   NA 139 01/26/2017   K 3.2 (L) 02/10/2017 0353   CL 105 02/10/2017 0353   CO2 28 02/10/2017 0353   GLUCOSE 210 (H) 02/10/2017 0353   BUN 14 02/10/2017 0353   BUN 24 (A) 01/26/2017   CREATININE 0.89 02/10/2017 0353   CALCIUM 7.9 (L) 02/10/2017 0353   PROT 5.7 (L) 02/04/2017 1255   ALBUMIN 2.0 (L) 02/04/2017 2116   AST 33 02/04/2017 1255   ALT 22 02/04/2017 1255   ALKPHOS 103 02/04/2017 1255   BILITOT 0.4 02/04/2017 1255   GFRNONAA >60 02/10/2017 0353   GFRAA >60 02/10/2017 0353   Recent Labs    02/08/17 0337 02/09/17 0326 02/10/17 0353  NA 139 140 139  K  3.6 3.7 3.2*  CL 109 106 105  CO2 26 28 28   GLUCOSE 129* 133* 210*  BUN 13 14 14   CREATININE 0.81 0.79 0.89  CALCIUM 8.0* 8.2* 7.9*   Recent Labs    01/23/17 01/26/17 02/04/17 1255 02/04/17 2116  AST 40 38 33  --   ALT 31 37 22  --   ALKPHOS 100 93 103  --   BILITOT  --   --  0.4  --   PROT  --   --  5.7*  --   ALBUMIN  --   --  2.0* 2.0*   Recent Labs    02/04/17 1255  02/07/17 0326 02/08/17 0337 02/10/17 0353  WBC 7.4   < > 6.4 6.6 5.4  NEUTROABS 4.5  --   --   --   --   HGB 8.8*   < > 7.5* 7.5* 7.8*  HCT 28.2*   < > 23.6* 24.0* 24.4*  MCV 93.4   < > 94.4 93.4 93.5  PLT 235   < > 197 198 221   < > = values in this interval not displayed.   No results for input(s): CHOL, LDLCALC, TRIG in the last 8760 hours.  Invalid input(s): HCL No results found for: MICROALBUR No results found for: TSH Lab Results  Component Value Date   HGBA1C 5.3 02/04/2017   No results found for: CHOL, HDL, LDLCALC, LDLDIRECT, TRIG, CHOLHDL  Significant Diagnostic Results in last 30 days:  No results found.  Assessment and Plan  Escherichia coli sepsis/Escherichia coli cystitis-treated with IV antibiotics and white count improving from 21,000-13,000; patient will continue ertapenem 1 g IV every 24  hours through 06/16/2017 SNF - patient admitted for care and IV antibiotics; continue ertapenem 1 g IV every 24 hours through 06/16/2017  Quadriplegia/debility-continue to extensive history of CVA with loss of brain matter, contracted upper and lower extremities; prior to this admission patient was living with his daughter who cared for all his needs; patient's daughter has spoken about her care was to keep him a full code and wants to continue with aggressive treatments SNF - supportive care  Seizure disorder-  it was suspected that patient was having merely spasms and tremors and not truly seizures SNF - continue Keppra 750 mg P per tube twice a day  Acute renal insufficiency with hypernatremia and hyperkalemia-improved with IV fluids SNF - will follow-up BMP  History of DVT SNF - continue Eliquis 5 mg per tube twice a day  Hypertension SNF - controlled; continue hydralazine 50 mg per tube every 8 hours Isordil 20 mg per tube 3 times a day Cozaar 25 mg daily and Toprol-XL 50 mg twice a day    Time spent greater than 45 minutes'> 50% of time with patient was spent reviewing records, labs, tests and studies, counseling and developing plan of care  Thurston Holenne D. Lyn HollingsheadAlexander, MD

## 2017-06-12 LAB — BASIC METABOLIC PANEL
BUN: 19 (ref 4–21)
BUN: 19 (ref 4–21)
CALCIUM: 8.3
Creat: 0.47
Creatinine: 0.5 — AB (ref 0.6–1.3)
GLUCOSE: 100
Glucose: 100
POTASSIUM: 5.3
Potassium: 5.3 (ref 3.4–5.3)
SODIUM: 140 (ref 137–147)
Sodium: 140

## 2017-06-12 LAB — CBC AND DIFFERENTIAL
HEMATOCRIT: 26 — AB (ref 41–53)
Hemoglobin: 8.6 — AB (ref 13.5–17.5)
Platelets: 327 (ref 150–399)
WBC: 6.9

## 2017-06-12 LAB — CBC
HCT: 25.7
HEMOGLOBIN: 8.6
PLATELET COUNT: 327
WBC: 6.9

## 2017-06-13 ENCOUNTER — Encounter: Payer: Self-pay | Admitting: Internal Medicine

## 2017-06-13 DIAGNOSIS — R4189 Other symptoms and signs involving cognitive functions and awareness: Secondary | ICD-10-CM | POA: Insufficient documentation

## 2017-06-13 DIAGNOSIS — B9620 Unspecified Escherichia coli [E. coli] as the cause of diseases classified elsewhere: Secondary | ICD-10-CM | POA: Insufficient documentation

## 2017-06-13 DIAGNOSIS — N390 Urinary tract infection, site not specified: Secondary | ICD-10-CM

## 2017-06-13 DIAGNOSIS — R5381 Other malaise: Secondary | ICD-10-CM | POA: Insufficient documentation

## 2017-06-13 DIAGNOSIS — Z931 Gastrostomy status: Secondary | ICD-10-CM | POA: Insufficient documentation

## 2017-06-13 DIAGNOSIS — N179 Acute kidney failure, unspecified: Secondary | ICD-10-CM | POA: Insufficient documentation

## 2017-06-13 DIAGNOSIS — A4151 Sepsis due to Escherichia coli [E. coli]: Secondary | ICD-10-CM | POA: Insufficient documentation

## 2017-06-13 DIAGNOSIS — G8250 Quadriplegia, unspecified: Secondary | ICD-10-CM | POA: Insufficient documentation

## 2017-06-15 DIAGNOSIS — L89892 Pressure ulcer of other site, stage 2: Secondary | ICD-10-CM | POA: Diagnosis not present

## 2017-06-15 DIAGNOSIS — L89621 Pressure ulcer of left heel, stage 1: Secondary | ICD-10-CM | POA: Diagnosis not present

## 2017-06-15 DIAGNOSIS — L89894 Pressure ulcer of other site, stage 4: Secondary | ICD-10-CM | POA: Diagnosis not present

## 2017-06-15 DIAGNOSIS — L89611 Pressure ulcer of right heel, stage 1: Secondary | ICD-10-CM | POA: Diagnosis not present

## 2017-06-15 DIAGNOSIS — L89614 Pressure ulcer of right heel, stage 4: Secondary | ICD-10-CM | POA: Diagnosis not present

## 2017-06-15 DIAGNOSIS — L89153 Pressure ulcer of sacral region, stage 3: Secondary | ICD-10-CM | POA: Diagnosis not present

## 2017-06-16 ENCOUNTER — Encounter: Payer: Self-pay | Admitting: Internal Medicine

## 2017-06-16 NOTE — Progress Notes (Signed)
Opened in error; Disregard.

## 2017-06-22 DIAGNOSIS — L89894 Pressure ulcer of other site, stage 4: Secondary | ICD-10-CM | POA: Diagnosis not present

## 2017-06-22 DIAGNOSIS — L89614 Pressure ulcer of right heel, stage 4: Secondary | ICD-10-CM | POA: Diagnosis not present

## 2017-06-22 DIAGNOSIS — L89621 Pressure ulcer of left heel, stage 1: Secondary | ICD-10-CM | POA: Diagnosis not present

## 2017-06-22 DIAGNOSIS — L89153 Pressure ulcer of sacral region, stage 3: Secondary | ICD-10-CM | POA: Diagnosis not present

## 2017-06-23 DIAGNOSIS — A4151 Sepsis due to Escherichia coli [E. coli]: Secondary | ICD-10-CM | POA: Diagnosis not present

## 2017-06-23 DIAGNOSIS — N3000 Acute cystitis without hematuria: Secondary | ICD-10-CM | POA: Diagnosis not present

## 2017-06-23 DIAGNOSIS — L89152 Pressure ulcer of sacral region, stage 2: Secondary | ICD-10-CM | POA: Diagnosis not present

## 2017-06-24 ENCOUNTER — Non-Acute Institutional Stay (SKILLED_NURSING_FACILITY): Payer: Medicare Other

## 2017-06-24 ENCOUNTER — Encounter: Payer: Self-pay | Admitting: *Deleted

## 2017-06-24 DIAGNOSIS — Z0000 Encounter for general adult medical examination without abnormal findings: Secondary | ICD-10-CM

## 2017-06-24 NOTE — Progress Notes (Signed)
Subjective:   Dalton Ferguson is a 67 y.o. male who presents for an Initial Medicare Annual Wellness Visit at [facility]; incapacitated patient unable to answer questions appropriately       Objective:    Today's Vitals   06/24/17 1127  BP: 108/60  Pulse: (!) 59  Temp: 98 F (36.7 C)  TempSrc: Oral  SpO2: 97%  Weight: 138 lb (62.6 kg)  Height: 6' (1.829 m)   Body mass index is 18.72 kg/m.  Advanced Directives 06/24/2017 06/10/2017  Does Patient Have a Medical Advance Directive? No No  Would patient like information on creating a medical advance directive? No - Patient declined -    Current Medications (verified) Outpatient Encounter Medications as of 06/24/2017  Medication Sig  . apixaban (ELIQUIS) 5 MG TABS tablet Place 5 mg into feeding tube daily.  . Cholecalciferol (VITAMIN D-3 PO) 3,000 Units by PEG Tube route daily.   . ciprofloxacin (CILOXAN) 0.3 % ophthalmic ointment Place 1 drop into the left eye daily.  . ertapenem (INVANZ) IVPB Inject 1 g into the vein daily.  Marland Kitchen FLUoxetine (PROZAC) 20 MG capsule Place 10 mg into feeding tube daily.   Marland Kitchen gabapentin (NEURONTIN) 300 MG capsule Place 600 mg into feeding tube 3 (three) times daily.   . hydrALAZINE (APRESOLINE) 50 MG tablet Take 50 mg by mouth every 8 (eight) hours.  Marland Kitchen HYDROcodone-acetaminophen (NORCO/VICODIN) 5-325 MG tablet Take 1 tablet by mouth every 4 (four) hours as needed for moderate pain.  . isosorbide dinitrate (ISORDIL) 20 MG tablet Place 20 mg into feeding tube 3 (three) times daily.  Marland Kitchen ketoconazole (NIZORAL) 2 % shampoo Apply 1 application topically as needed for irritation.  . levETIRAcetam (KEPPRA) 750 MG tablet Take 1,500 mg by mouth 2 (two) times daily.  Marland Kitchen losartan (COZAAR) 25 MG tablet Place 25 mg into feeding tube daily.  . metoprolol tartrate (LOPRESSOR) 50 MG tablet Place 50 mg into feeding tube 2 (two) times daily.  . nitroGLYCERIN (NITROSTAT) 0.4 MG SL tablet Place 0.4 mg under the  tongue every 5 (five) minutes as needed for chest pain.  Marland Kitchen ondansetron (ZOFRAN) 4 MG tablet Place 4 mg into feeding tube every 8 (eight) hours as needed for nausea or vomiting.  Marland Kitchen QUEtiapine (SEROQUEL) 25 MG tablet Take 25 mg by mouth at bedtime.   No facility-administered encounter medications on file as of 06/24/2017.     Allergies (verified) Levemir [insulin detemir]   History: Past Medical History:  Diagnosis Date  . Anemia   . Blindness of left eye   . CHF (congestive heart failure) (HCC)   . Depression   . Diabetes mellitus without complication (HCC)   . Hypertension   . Seizures (HCC)   . Stroke [facility])    Past Surgical History:  Procedure Laterality Date  . PEG TUBE PLACEMENT     Family History  Problem Relation Age of Onset  . Stroke Mother   . Hypertension Father    Social History   Socioeconomic History  . Marital status: Single    Spouse name: None  . Number of children: None  . Years of education: None  . Highest education level: None  Social Needs  . Financial resource strain: None  . Food insecurity - worry: None  . Food insecurity - inability: None  . Transportation needs - medical: None  . Transportation needs - non-medical: None  Occupational History  . None  Tobacco Use  . Smoking status:  Unknown If Ever Smoked  . Smokeless tobacco: Never Used  Substance and Sexual Activity  . Alcohol use: No    Alcohol/week: 0.0 oz  . Drug use: No  . Sexual activity: Not Currently  Other Topics Concern  . None  Social History Narrative  . None   Tobacco Counseling Counseling given: Not Answered   Clinical Intake:  Pre-visit preparation completed: No  Pain : No/denies pain     Nutritional Status: BMI <19  Underweight Nutritional Risks: None Diabetes: No     Interpreter Needed?: No  Information entered by :: Dalton Russell, RN  Activities of Daily Living In your present state of health, do you have any difficulty performing the following  activities: 06/24/2017 02/10/2017  Hearing? N N  Vision? Y Y  Difficulty concentrating or making decisions? Malvin Johns  Walking or climbing stairs? Y Y  Dressing or bathing? Y Y  Doing errands, shopping? Malvin Johns  Preparing Food and eating ? Y -  Using the Toilet? Y -  In the past six months, have you accidently leaked urine? Y -  Do you have problems with loss of bowel control? Y -  Managing your Medications? Y -  Managing your Finances? Y -  Housekeeping or managing your Housekeeping? Y -  Some recent data might be hidden     Immunizations and Health Maintenance Immunization History  Administered Date(s) Administered  . Influenza, Seasonal, Injecte, Preservative Fre 05/18/2013  . Influenza-Unspecified 05/18/2013   Health Maintenance Due  Topic Date Due  . Hepatitis C Screening  [birth year]  . FOOT EXAM  08/01/1959  . OPHTHALMOLOGY EXAM  08/01/1959  . TETANUS/TDAP  07/31/1968  . COLONOSCOPY  08/01/1999  . PNA vac Low Risk Adult (1 of 2 - PCV13) 08/01/2014  . INFLUENZA VACCINE  12/03/2016    No care team member to display  Indicate any recent Medical Services you may have received from other than [facility] providers in the past year (date may be approximate).    Assessment:   This is a routine wellness examination for [name].  Hearing/Vision screen No exam data present  Dietary issues and exercise activities discussed: Current Exercise Habits: The patient does not participate in regular exercise at present, Exercise limited by: orthopedic condition(s)  Goals    None     Depression Screen PHQ 2/9 Scores 06/24/2017  Exception Documentation Medical reason    Fall Risk Fall Risk  06/24/2017  Falls in the past year? No    Is the patient's home free of loose throw rugs in walkways, pet beds, electrical cords, etc?   yes      Grab bars in the bathroom? yes      Handrails on the stairs?   yes      Adequate lighting?   yes  Cognitive Function:     6CIT Screen 06/24/2017  What  Year? 4 points  What month? 0 points  What time? 3 points  Count back from 20 4 points  Months in reverse 4 points  Repeat phrase 10 points  Total Score 25    Screening Tests Health Maintenance  Topic Date Due  . Hepatitis C Screening  10-07-1950  . FOOT EXAM  08/01/1959  . OPHTHALMOLOGY EXAM  08/01/1959  . TETANUS/TDAP  07/31/1968  . COLONOSCOPY  08/01/1999  . PNA vac Low Risk Adult (1 of 2 - PCV13) 08/01/2014  . INFLUENZA VACCINE  12/03/2016  . HEMOGLOBIN A1C  08/05/2017    Qualifies for Shingles Vaccine?  Yes, not in past records  Cancer Screenings: Lung: Low Dose CT Chest recommended if Age 37-80 years, 30 pack-year currently smoking OR have quit w/in 15years. Patient does not qualify. Colorectal: due, not ordered due to physical condition  Additional Screenings:  Hepatitis C Screening: declined PNA 13, flu vaccine, and tdap vaccine due and ordered       Plan:    I have personally reviewed and addressed the Medicare Annual Wellness questionnaire and have noted the following in the patient's chart:  A. Medical and social history B. Use of alcohol, tobacco or illicit drugs  C. Current medications and supplements D. Functional ability and status E.  Nutritional status F.  Physical activity G. Advance directives H. List of other physicians I.  Hospitalizations, surgeries, and ER visits in previous 12 months J.  Vitals K. Screenings to include hearing, vision, cognitive, depression L. Referrals and appointments - none  In addition, I am unable to review and discuss with incapacitated patient certain preventive protocols, quality metrics, and best practice recommendations. A written personalized care plan for preventive services as well as general preventive health recommendations were provided to patient.  See attached scanned questionnaire for additional information.   Signed,   Dalton Russell, RN Nurse Health Advisor  Patient Concerns:None

## 2017-06-26 ENCOUNTER — Non-Acute Institutional Stay (SKILLED_NURSING_FACILITY): Payer: Medicare Other | Admitting: Internal Medicine

## 2017-06-26 ENCOUNTER — Encounter: Payer: Self-pay | Admitting: Internal Medicine

## 2017-06-26 DIAGNOSIS — E870 Hyperosmolality and hypernatremia: Secondary | ICD-10-CM | POA: Diagnosis not present

## 2017-06-26 DIAGNOSIS — R569 Unspecified convulsions: Secondary | ICD-10-CM | POA: Diagnosis not present

## 2017-06-26 DIAGNOSIS — N179 Acute kidney failure, unspecified: Secondary | ICD-10-CM

## 2017-06-26 DIAGNOSIS — I10 Essential (primary) hypertension: Secondary | ICD-10-CM | POA: Diagnosis not present

## 2017-06-26 DIAGNOSIS — Z86718 Personal history of other venous thrombosis and embolism: Secondary | ICD-10-CM | POA: Diagnosis not present

## 2017-06-26 DIAGNOSIS — N390 Urinary tract infection, site not specified: Secondary | ICD-10-CM

## 2017-06-26 DIAGNOSIS — A4151 Sepsis due to Escherichia coli [E. coli]: Secondary | ICD-10-CM | POA: Diagnosis not present

## 2017-06-26 DIAGNOSIS — G8250 Quadriplegia, unspecified: Secondary | ICD-10-CM | POA: Diagnosis not present

## 2017-06-26 DIAGNOSIS — B9620 Unspecified Escherichia coli [E. coli] as the cause of diseases classified elsewhere: Secondary | ICD-10-CM

## 2017-06-26 NOTE — Progress Notes (Addendum)
Location:   Technical sales engineer of Service:   skilled nursing facility Provider: Margit Hanks MD  PCP: No primary care provider on file. No care team member to display  Extended Emergency Contact Information Primary Emergency Contact: Hopkins,Alaina Address: [address]          [place] [address] [place] Home Phone: [phone] Relation: None Secondary Emergency Contact: [facility] Address: [address]          [place]  [address] Home Phone: [phone] Relation: None  Allergies  Allergen Reactions  . Levemir [Insulin Detemir]     Chief Complaint  Patient presents with  . Discharge Note    HPI:  67 y.o. male  with quadriplegia, G-tube dependent, who presented to [facility] altered mental status. Patient was admitted to [facility] from 1/25-2/5 where he was worked up for and treated for Escherichia coli sepsis due to acute cystitis. He also had acute renal insufficiency with hypernatremia and hypokalemia. Patient was admitted to skilled nursing facility for care and IV antibiotics and is now ready to be discharged to home.    Past Medical History:  Diagnosis Date  . Anemia   . Blindness of left eye   . CHF (congestive heart failure) (HCC)   . Depression   . Diabetes mellitus without complication (HCC)   . Hypertension   . Seizures (HCC)   . Stroke [facility])     Past Surgical History:  Procedure Laterality Date  . PEG TUBE PLACEMENT       has an unknown smoking status. he has never used smokeless tobacco. He reports that he does not drink alcohol or use drugs. Social History   Socioeconomic History  . Marital status: Single    Spouse name: Not on file  . Number of children: Not on file  . Years of education: Not on file  . Highest education level: Not on file  Social Needs  . Financial resource strain: Not on file  . Food insecurity - worry: Not on file  . Food insecurity - inability: Not on  file  . Transportation needs - medical: Not on file  . Transportation needs - non-medical: Not on file  Occupational History  . Not on file  Tobacco Use  . Smoking status: Unknown If Ever Smoked  . Smokeless tobacco: Never Used  Substance and Sexual Activity  . Alcohol use: No    Alcohol/week: 0.0 oz  . Drug use: No  . Sexual activity: Not Currently  Other Topics Concern  . Not on file  Social History Narrative  . Not on file    Pertinent  Health Maintenance Due  Topic Date Due  . FOOT EXAM  08/01/1959  . OPHTHALMOLOGY EXAM  08/01/1959  . COLONOSCOPY  08/01/1999  . PNA vac Low Risk Adult (1 of 2 - PCV13) 08/01/2014  . INFLUENZA VACCINE  12/03/2016  . HEMOGLOBIN A1C  08/05/2017    Medications: Allergies as of 06/26/2017      Reactions   Levemir [insulin Detemir]       Medication List        Accurate as of 06/26/17 11:59 PM. Always use your most recent med list.          apixaban 5 MG Tabs tablet Commonly known as:  ELIQUIS Place 5 mg into feeding tube daily.   FLUoxetine 20 MG capsule Commonly known as:  PROZAC Place 10 mg into feeding tube daily.  gabapentin 300 MG capsule Commonly known as:  NEURONTIN Place 600 mg into feeding tube 3 (three) times daily.   hydrALAZINE 50 MG tablet Commonly known as:  APRESOLINE Take 50 mg by mouth every 8 (eight) hours.   HYDROcodone-acetaminophen 5-325 MG tablet Commonly known as:  NORCO/VICODIN Take 1 tablet by mouth every 4 (four) hours as needed for moderate pain.   isosorbide dinitrate 20 MG tablet Commonly known as:  ISORDIL Place 20 mg into feeding tube 3 (three) times daily.   ketoconazole 2 % shampoo Commonly known as:  NIZORAL Apply 1 application topically as needed for irritation.   levETIRAcetam 750 MG tablet Commonly known as:  KEPPRA Take 1,500 mg by mouth 2 (two) times daily.   losartan 25 MG tablet Commonly known as:  COZAAR Place 25 mg into feeding tube daily.   metoprolol tartrate 50 MG  tablet Commonly known as:  LOPRESSOR Place 50 mg into feeding tube 2 (two) times daily.   nitroGLYCERIN 0.4 MG SL tablet Commonly known as:  NITROSTAT Place 0.4 mg under the tongue every 5 (five) minutes as needed for chest pain.   ondansetron 4 MG tablet Commonly known as:  ZOFRAN Place 4 mg into feeding tube every 8 (eight) hours as needed for nausea or vomiting.   QUEtiapine 25 MG tablet Commonly known as:  SEROQUEL Take 25 mg by mouth at bedtime.   VITAMIN D-3 PO 3,000 Units by PEG Tube route daily.        Vitals:   06/26/17 1609  BP: 122/82  Pulse: 86  Resp: 20  Temp: 97.6 F (36.4 C)  SpO2: 96%   There is no height or weight on file to calculate BMI.  Physical Exam  GENERAL APPEARANCE: Alert, conversant. No acute distress.  HEENT: Unremarkable. RESPIRATORY: Breathing is even, unlabored. Lung sounds are clear   CARDIOVASCULAR: Heart RRR 3/6 murmurs, no rubs or gallops. No peripheral edema.  GASTROINTESTINAL: Abdomen is soft, non-tender, not distended w/ normal bowel sounds.  NEUROLOGIC: Cranial nerves 2-12 grossly intact; some upper extremity movement   Labs reviewed: Basic Metabolic Panel: Recent Labs    02/08/17 0337 02/09/17 0326 02/10/17 0353 06/12/17  NA 139 140 139 140  140  K 3.6 3.7 3.2* 5.3  5.3  CL 109 106 105  --   CO2 26 28 28   --   GLUCOSE 129* 133* 210*  --   BUN 13 14 14 19  19   CREATININE 0.81 0.79 0.89 0.5*  0.47  CALCIUM 8.0* 8.2* 7.9* 8.3   No results found for: [facility] Liver Function Tests: Recent Labs    01/23/17 01/26/17 02/04/17 1255 02/04/17 2116  AST 40 38 33  --   ALT 31 37 22  --   ALKPHOS 100 93 103  --   BILITOT  --   --  0.4  --   PROT  --   --  5.7*  --   ALBUMIN  --   --  2.0* 2.0*   No results for input(s): LIPASE, AMYLASE in the last 8760 hours. No results for input(s): AMMONIA in the last 8760 hours. CBC: Recent Labs    02/04/17 1255  02/07/17 0326 02/08/17 0337 02/10/17 0353 06/12/17  WBC  7.4   < > 6.4 6.6 5.4 6.9  6.9  NEUTROABS 4.5  --   --   --   --   --   HGB 8.8*   < > 7.5* 7.5* 7.8* 8.6*  8.6  HCT 28.2*   < >  23.6* 24.0* 24.4* 26*  25.7  MCV 93.4   < > 94.4 93.4 93.5  --   PLT 235   < > 197 198 221 327   < > = values in this interval not displayed.   Lipid No results for input(s): CHOL, HDL, LDLCALC, TRIG in the last 8760 hours. Cardiac Enzymes: No results for input(s): CKTOTAL, CKMB, CKMBINDEX, TROPONINI in the last 8760 hours. BNP: No results for input(s): BNP in the last 8760 hours. CBG: Recent Labs    02/11/17 0804 02/11/17 1144 02/11/17 1644  GLUCAP 257* 223* 232*    Procedures and Imaging Studies During Stay: No results found.  Assessment/Plan:   Escherichia coli sepsis (HCC)  Escherichia coli urinary tract infection  Quadriplegia (HCC)  Seizures (HCC)  Acute kidney injury (HCC)  Hypernatremia  Essential hypertension  Hx of deep venous thrombosis   Patient is being discharged with the following home health services:  OT/PT/nursing  Patient is being discharged with the following durable medical equipment: None   Patient has been advised to f/u with their PCP in 1-2 weeks to bring them up to date on their rehab stay.  Social services at facility was responsible for arranging this appointment.  Pt was provided with a 30 day supply of prescriptions for medications and refills must be obtained from their PCP.  For controlled substances, a more limited supply may be provided adequate until PCP appointment only.  Medications have been reconciled.  Time spent greater than 30 minutes;> 50% of time with patient was spent reviewing records, labs, tests and studies, counseling and developing plan of care  Merrilee SeashoreAnne Nour Rodrigues, MD

## 2017-06-27 DIAGNOSIS — E870 Hyperosmolality and hypernatremia: Secondary | ICD-10-CM | POA: Insufficient documentation

## 2017-06-29 DIAGNOSIS — L89621 Pressure ulcer of left heel, stage 1: Secondary | ICD-10-CM | POA: Diagnosis not present

## 2017-06-29 DIAGNOSIS — L89892 Pressure ulcer of other site, stage 2: Secondary | ICD-10-CM | POA: Diagnosis not present

## 2017-06-29 DIAGNOSIS — L89153 Pressure ulcer of sacral region, stage 3: Secondary | ICD-10-CM | POA: Diagnosis not present

## 2017-06-29 DIAGNOSIS — L89611 Pressure ulcer of right heel, stage 1: Secondary | ICD-10-CM | POA: Diagnosis not present

## 2017-06-30 DIAGNOSIS — M2041 Other hammer toe(s) (acquired), right foot: Secondary | ICD-10-CM | POA: Diagnosis not present

## 2017-06-30 DIAGNOSIS — M79675 Pain in left toe(s): Secondary | ICD-10-CM | POA: Diagnosis not present

## 2017-06-30 DIAGNOSIS — E0840 Diabetes mellitus due to underlying condition with diabetic neuropathy, unspecified: Secondary | ICD-10-CM | POA: Diagnosis not present

## 2017-06-30 DIAGNOSIS — B351 Tinea unguium: Secondary | ICD-10-CM | POA: Diagnosis not present

## 2017-06-30 DIAGNOSIS — L84 Corns and callosities: Secondary | ICD-10-CM | POA: Diagnosis not present

## 2017-07-02 ENCOUNTER — Telehealth: Payer: Self-pay | Admitting: *Deleted

## 2017-07-02 DIAGNOSIS — L89322 Pressure ulcer of left buttock, stage 2: Secondary | ICD-10-CM | POA: Diagnosis not present

## 2017-07-02 DIAGNOSIS — Z4800 Encounter for change or removal of nonsurgical wound dressing: Secondary | ICD-10-CM | POA: Diagnosis not present

## 2017-07-02 DIAGNOSIS — Z7901 Long term (current) use of anticoagulants: Secondary | ICD-10-CM | POA: Diagnosis not present

## 2017-07-02 DIAGNOSIS — L89892 Pressure ulcer of other site, stage 2: Secondary | ICD-10-CM | POA: Diagnosis not present

## 2017-07-02 DIAGNOSIS — Z931 Gastrostomy status: Secondary | ICD-10-CM | POA: Diagnosis not present

## 2017-07-02 DIAGNOSIS — I110 Hypertensive heart disease with heart failure: Secondary | ICD-10-CM | POA: Diagnosis not present

## 2017-07-02 DIAGNOSIS — I69365 Other paralytic syndrome following cerebral infarction, bilateral: Secondary | ICD-10-CM | POA: Diagnosis not present

## 2017-07-02 DIAGNOSIS — D649 Anemia, unspecified: Secondary | ICD-10-CM | POA: Diagnosis not present

## 2017-07-02 DIAGNOSIS — L89152 Pressure ulcer of sacral region, stage 2: Secondary | ICD-10-CM | POA: Diagnosis not present

## 2017-07-02 DIAGNOSIS — I509 Heart failure, unspecified: Secondary | ICD-10-CM | POA: Diagnosis not present

## 2017-07-02 DIAGNOSIS — F329 Major depressive disorder, single episode, unspecified: Secondary | ICD-10-CM | POA: Diagnosis not present

## 2017-07-02 DIAGNOSIS — L89612 Pressure ulcer of right heel, stage 2: Secondary | ICD-10-CM | POA: Diagnosis not present

## 2017-07-02 DIAGNOSIS — Z86718 Personal history of other venous thrombosis and embolism: Secondary | ICD-10-CM | POA: Diagnosis not present

## 2017-07-02 DIAGNOSIS — G8250 Quadriplegia, unspecified: Secondary | ICD-10-CM | POA: Diagnosis not present

## 2017-07-02 DIAGNOSIS — L89312 Pressure ulcer of right buttock, stage 2: Secondary | ICD-10-CM | POA: Diagnosis not present

## 2017-07-02 DIAGNOSIS — I2510 Atherosclerotic heart disease of native coronary artery without angina pectoris: Secondary | ICD-10-CM | POA: Diagnosis not present

## 2017-07-02 DIAGNOSIS — Z7401 Bed confinement status: Secondary | ICD-10-CM | POA: Diagnosis not present

## 2017-07-02 DIAGNOSIS — G40909 Epilepsy, unspecified, not intractable, without status epilepticus: Secondary | ICD-10-CM | POA: Diagnosis not present

## 2017-07-02 DIAGNOSIS — E119 Type 2 diabetes mellitus without complications: Secondary | ICD-10-CM | POA: Diagnosis not present

## 2017-07-02 DIAGNOSIS — L89151 Pressure ulcer of sacral region, stage 1: Secondary | ICD-10-CM | POA: Diagnosis not present

## 2017-07-02 DIAGNOSIS — L89890 Pressure ulcer of other site, unstageable: Secondary | ICD-10-CM | POA: Diagnosis not present

## 2017-07-02 DIAGNOSIS — I69391 Dysphagia following cerebral infarction: Secondary | ICD-10-CM | POA: Diagnosis not present

## 2017-07-02 DIAGNOSIS — Z87440 Personal history of urinary (tract) infections: Secondary | ICD-10-CM | POA: Diagnosis not present

## 2017-07-02 DIAGNOSIS — Z466 Encounter for fitting and adjustment of urinary device: Secondary | ICD-10-CM | POA: Diagnosis not present

## 2017-07-02 NOTE — Telephone Encounter (Signed)
[facility] called stating that they received Rx's via fax and they have some questions. Patient was a resident of [facility]. Informed pharmacy to call [facility], number given.

## 2017-07-06 DIAGNOSIS — L89152 Pressure ulcer of sacral region, stage 2: Secondary | ICD-10-CM | POA: Diagnosis not present

## 2017-07-06 DIAGNOSIS — G8250 Quadriplegia, unspecified: Secondary | ICD-10-CM | POA: Diagnosis not present

## 2017-07-06 DIAGNOSIS — L89312 Pressure ulcer of right buttock, stage 2: Secondary | ICD-10-CM | POA: Diagnosis not present

## 2017-07-06 DIAGNOSIS — I69365 Other paralytic syndrome following cerebral infarction, bilateral: Secondary | ICD-10-CM | POA: Diagnosis not present

## 2017-07-06 DIAGNOSIS — L89322 Pressure ulcer of left buttock, stage 2: Secondary | ICD-10-CM | POA: Diagnosis not present

## 2017-07-06 DIAGNOSIS — L89890 Pressure ulcer of other site, unstageable: Secondary | ICD-10-CM | POA: Diagnosis not present

## 2017-07-07 DIAGNOSIS — L89152 Pressure ulcer of sacral region, stage 2: Secondary | ICD-10-CM | POA: Diagnosis not present

## 2017-07-07 DIAGNOSIS — L89322 Pressure ulcer of left buttock, stage 2: Secondary | ICD-10-CM | POA: Diagnosis not present

## 2017-07-07 DIAGNOSIS — I69365 Other paralytic syndrome following cerebral infarction, bilateral: Secondary | ICD-10-CM | POA: Diagnosis not present

## 2017-07-07 DIAGNOSIS — L89312 Pressure ulcer of right buttock, stage 2: Secondary | ICD-10-CM | POA: Diagnosis not present

## 2017-07-07 DIAGNOSIS — G8250 Quadriplegia, unspecified: Secondary | ICD-10-CM | POA: Diagnosis not present

## 2017-07-07 DIAGNOSIS — L89890 Pressure ulcer of other site, unstageable: Secondary | ICD-10-CM | POA: Diagnosis not present

## 2017-07-08 DIAGNOSIS — G8250 Quadriplegia, unspecified: Secondary | ICD-10-CM | POA: Diagnosis not present

## 2017-07-08 DIAGNOSIS — L89152 Pressure ulcer of sacral region, stage 2: Secondary | ICD-10-CM | POA: Diagnosis not present

## 2017-07-08 DIAGNOSIS — L89322 Pressure ulcer of left buttock, stage 2: Secondary | ICD-10-CM | POA: Diagnosis not present

## 2017-07-08 DIAGNOSIS — L89890 Pressure ulcer of other site, unstageable: Secondary | ICD-10-CM | POA: Diagnosis not present

## 2017-07-08 DIAGNOSIS — L89312 Pressure ulcer of right buttock, stage 2: Secondary | ICD-10-CM | POA: Diagnosis not present

## 2017-07-08 DIAGNOSIS — I69365 Other paralytic syndrome following cerebral infarction, bilateral: Secondary | ICD-10-CM | POA: Diagnosis not present

## 2017-07-09 DIAGNOSIS — G8250 Quadriplegia, unspecified: Secondary | ICD-10-CM | POA: Diagnosis not present

## 2017-07-09 DIAGNOSIS — I69365 Other paralytic syndrome following cerebral infarction, bilateral: Secondary | ICD-10-CM | POA: Diagnosis not present

## 2017-07-09 DIAGNOSIS — L89322 Pressure ulcer of left buttock, stage 2: Secondary | ICD-10-CM | POA: Diagnosis not present

## 2017-07-09 DIAGNOSIS — L89312 Pressure ulcer of right buttock, stage 2: Secondary | ICD-10-CM | POA: Diagnosis not present

## 2017-07-09 DIAGNOSIS — L89152 Pressure ulcer of sacral region, stage 2: Secondary | ICD-10-CM | POA: Diagnosis not present

## 2017-07-09 DIAGNOSIS — L89890 Pressure ulcer of other site, unstageable: Secondary | ICD-10-CM | POA: Diagnosis not present

## 2017-07-10 DIAGNOSIS — G8250 Quadriplegia, unspecified: Secondary | ICD-10-CM | POA: Diagnosis not present

## 2017-07-10 DIAGNOSIS — L89312 Pressure ulcer of right buttock, stage 2: Secondary | ICD-10-CM | POA: Diagnosis not present

## 2017-07-10 DIAGNOSIS — I69365 Other paralytic syndrome following cerebral infarction, bilateral: Secondary | ICD-10-CM | POA: Diagnosis not present

## 2017-07-10 DIAGNOSIS — L89322 Pressure ulcer of left buttock, stage 2: Secondary | ICD-10-CM | POA: Diagnosis not present

## 2017-07-10 DIAGNOSIS — L89152 Pressure ulcer of sacral region, stage 2: Secondary | ICD-10-CM | POA: Diagnosis not present

## 2017-07-10 DIAGNOSIS — L89890 Pressure ulcer of other site, unstageable: Secondary | ICD-10-CM | POA: Diagnosis not present

## 2017-07-15 DIAGNOSIS — I69365 Other paralytic syndrome following cerebral infarction, bilateral: Secondary | ICD-10-CM | POA: Diagnosis not present

## 2017-07-15 DIAGNOSIS — L89322 Pressure ulcer of left buttock, stage 2: Secondary | ICD-10-CM | POA: Diagnosis not present

## 2017-07-15 DIAGNOSIS — G8250 Quadriplegia, unspecified: Secondary | ICD-10-CM | POA: Diagnosis not present

## 2017-07-15 DIAGNOSIS — L89152 Pressure ulcer of sacral region, stage 2: Secondary | ICD-10-CM | POA: Diagnosis not present

## 2017-07-15 DIAGNOSIS — L89312 Pressure ulcer of right buttock, stage 2: Secondary | ICD-10-CM | POA: Diagnosis not present

## 2017-07-15 DIAGNOSIS — L89890 Pressure ulcer of other site, unstageable: Secondary | ICD-10-CM | POA: Diagnosis not present

## 2017-07-21 DIAGNOSIS — I69365 Other paralytic syndrome following cerebral infarction, bilateral: Secondary | ICD-10-CM | POA: Diagnosis not present

## 2017-07-21 DIAGNOSIS — L89890 Pressure ulcer of other site, unstageable: Secondary | ICD-10-CM | POA: Diagnosis not present

## 2017-07-21 DIAGNOSIS — G8250 Quadriplegia, unspecified: Secondary | ICD-10-CM | POA: Diagnosis not present

## 2017-07-21 DIAGNOSIS — L89152 Pressure ulcer of sacral region, stage 2: Secondary | ICD-10-CM | POA: Diagnosis not present

## 2017-07-21 DIAGNOSIS — L89312 Pressure ulcer of right buttock, stage 2: Secondary | ICD-10-CM | POA: Diagnosis not present

## 2017-07-21 DIAGNOSIS — L89322 Pressure ulcer of left buttock, stage 2: Secondary | ICD-10-CM | POA: Diagnosis not present

## 2017-07-23 DIAGNOSIS — I2510 Atherosclerotic heart disease of native coronary artery without angina pectoris: Secondary | ICD-10-CM | POA: Diagnosis not present

## 2017-07-23 DIAGNOSIS — E119 Type 2 diabetes mellitus without complications: Secondary | ICD-10-CM | POA: Diagnosis not present

## 2017-07-24 DIAGNOSIS — L89312 Pressure ulcer of right buttock, stage 2: Secondary | ICD-10-CM | POA: Diagnosis not present

## 2017-07-24 DIAGNOSIS — I69365 Other paralytic syndrome following cerebral infarction, bilateral: Secondary | ICD-10-CM | POA: Diagnosis not present

## 2017-07-24 DIAGNOSIS — L89322 Pressure ulcer of left buttock, stage 2: Secondary | ICD-10-CM | POA: Diagnosis not present

## 2017-07-24 DIAGNOSIS — L89152 Pressure ulcer of sacral region, stage 2: Secondary | ICD-10-CM | POA: Diagnosis not present

## 2017-07-24 DIAGNOSIS — L89890 Pressure ulcer of other site, unstageable: Secondary | ICD-10-CM | POA: Diagnosis not present

## 2017-07-24 DIAGNOSIS — G8250 Quadriplegia, unspecified: Secondary | ICD-10-CM | POA: Diagnosis not present

## 2017-07-31 DIAGNOSIS — L89152 Pressure ulcer of sacral region, stage 2: Secondary | ICD-10-CM | POA: Diagnosis not present

## 2017-07-31 DIAGNOSIS — L89312 Pressure ulcer of right buttock, stage 2: Secondary | ICD-10-CM | POA: Diagnosis not present

## 2017-07-31 DIAGNOSIS — L89322 Pressure ulcer of left buttock, stage 2: Secondary | ICD-10-CM | POA: Diagnosis not present

## 2017-07-31 DIAGNOSIS — I69365 Other paralytic syndrome following cerebral infarction, bilateral: Secondary | ICD-10-CM | POA: Diagnosis not present

## 2017-07-31 DIAGNOSIS — G8250 Quadriplegia, unspecified: Secondary | ICD-10-CM | POA: Diagnosis not present

## 2017-07-31 DIAGNOSIS — L89890 Pressure ulcer of other site, unstageable: Secondary | ICD-10-CM | POA: Diagnosis not present

## 2017-08-06 DIAGNOSIS — L89152 Pressure ulcer of sacral region, stage 2: Secondary | ICD-10-CM | POA: Diagnosis not present

## 2017-08-06 DIAGNOSIS — I69365 Other paralytic syndrome following cerebral infarction, bilateral: Secondary | ICD-10-CM | POA: Diagnosis not present

## 2017-08-06 DIAGNOSIS — L89322 Pressure ulcer of left buttock, stage 2: Secondary | ICD-10-CM | POA: Diagnosis not present

## 2017-08-06 DIAGNOSIS — L89890 Pressure ulcer of other site, unstageable: Secondary | ICD-10-CM | POA: Diagnosis not present

## 2017-08-06 DIAGNOSIS — L89312 Pressure ulcer of right buttock, stage 2: Secondary | ICD-10-CM | POA: Diagnosis not present

## 2017-08-06 DIAGNOSIS — G8250 Quadriplegia, unspecified: Secondary | ICD-10-CM | POA: Diagnosis not present

## 2017-08-07 DIAGNOSIS — L89152 Pressure ulcer of sacral region, stage 2: Secondary | ICD-10-CM | POA: Diagnosis not present

## 2017-08-07 DIAGNOSIS — L89322 Pressure ulcer of left buttock, stage 2: Secondary | ICD-10-CM | POA: Diagnosis not present

## 2017-08-07 DIAGNOSIS — L89312 Pressure ulcer of right buttock, stage 2: Secondary | ICD-10-CM | POA: Diagnosis not present

## 2017-08-07 DIAGNOSIS — G8250 Quadriplegia, unspecified: Secondary | ICD-10-CM | POA: Diagnosis not present

## 2017-08-07 DIAGNOSIS — L89890 Pressure ulcer of other site, unstageable: Secondary | ICD-10-CM | POA: Diagnosis not present

## 2017-08-07 DIAGNOSIS — I69365 Other paralytic syndrome following cerebral infarction, bilateral: Secondary | ICD-10-CM | POA: Diagnosis not present

## 2017-08-14 DIAGNOSIS — L89312 Pressure ulcer of right buttock, stage 2: Secondary | ICD-10-CM | POA: Diagnosis not present

## 2017-08-14 DIAGNOSIS — L89890 Pressure ulcer of other site, unstageable: Secondary | ICD-10-CM | POA: Diagnosis not present

## 2017-08-14 DIAGNOSIS — G8250 Quadriplegia, unspecified: Secondary | ICD-10-CM | POA: Diagnosis not present

## 2017-08-14 DIAGNOSIS — I69365 Other paralytic syndrome following cerebral infarction, bilateral: Secondary | ICD-10-CM | POA: Diagnosis not present

## 2017-08-14 DIAGNOSIS — L89152 Pressure ulcer of sacral region, stage 2: Secondary | ICD-10-CM | POA: Diagnosis not present

## 2017-08-14 DIAGNOSIS — L89322 Pressure ulcer of left buttock, stage 2: Secondary | ICD-10-CM | POA: Diagnosis not present

## 2017-08-16 ENCOUNTER — Other Ambulatory Visit: Payer: Self-pay | Admitting: Internal Medicine

## 2017-08-20 DIAGNOSIS — L89152 Pressure ulcer of sacral region, stage 2: Secondary | ICD-10-CM | POA: Diagnosis not present

## 2017-08-20 DIAGNOSIS — L89890 Pressure ulcer of other site, unstageable: Secondary | ICD-10-CM | POA: Diagnosis not present

## 2017-08-20 DIAGNOSIS — G8250 Quadriplegia, unspecified: Secondary | ICD-10-CM | POA: Diagnosis not present

## 2017-08-20 DIAGNOSIS — I69365 Other paralytic syndrome following cerebral infarction, bilateral: Secondary | ICD-10-CM | POA: Diagnosis not present

## 2017-08-20 DIAGNOSIS — L89322 Pressure ulcer of left buttock, stage 2: Secondary | ICD-10-CM | POA: Diagnosis not present

## 2017-08-20 DIAGNOSIS — L89312 Pressure ulcer of right buttock, stage 2: Secondary | ICD-10-CM | POA: Diagnosis not present

## 2017-08-27 DIAGNOSIS — I69365 Other paralytic syndrome following cerebral infarction, bilateral: Secondary | ICD-10-CM | POA: Diagnosis not present

## 2017-08-28 DIAGNOSIS — L89152 Pressure ulcer of sacral region, stage 2: Secondary | ICD-10-CM | POA: Diagnosis not present

## 2017-08-28 DIAGNOSIS — G8250 Quadriplegia, unspecified: Secondary | ICD-10-CM | POA: Diagnosis not present

## 2017-08-28 DIAGNOSIS — L89312 Pressure ulcer of right buttock, stage 2: Secondary | ICD-10-CM | POA: Diagnosis not present

## 2017-08-28 DIAGNOSIS — I69365 Other paralytic syndrome following cerebral infarction, bilateral: Secondary | ICD-10-CM | POA: Diagnosis not present

## 2017-08-28 DIAGNOSIS — L89890 Pressure ulcer of other site, unstageable: Secondary | ICD-10-CM | POA: Diagnosis not present

## 2017-08-28 DIAGNOSIS — L89322 Pressure ulcer of left buttock, stage 2: Secondary | ICD-10-CM | POA: Diagnosis not present

## 2017-08-31 DIAGNOSIS — I110 Hypertensive heart disease with heart failure: Secondary | ICD-10-CM | POA: Diagnosis not present

## 2017-08-31 DIAGNOSIS — Z87440 Personal history of urinary (tract) infections: Secondary | ICD-10-CM | POA: Diagnosis not present

## 2017-08-31 DIAGNOSIS — L89892 Pressure ulcer of other site, stage 2: Secondary | ICD-10-CM | POA: Diagnosis not present

## 2017-08-31 DIAGNOSIS — I69391 Dysphagia following cerebral infarction: Secondary | ICD-10-CM | POA: Diagnosis not present

## 2017-08-31 DIAGNOSIS — I5032 Chronic diastolic (congestive) heart failure: Secondary | ICD-10-CM | POA: Diagnosis not present

## 2017-08-31 DIAGNOSIS — I2510 Atherosclerotic heart disease of native coronary artery without angina pectoris: Secondary | ICD-10-CM | POA: Diagnosis not present

## 2017-08-31 DIAGNOSIS — E119 Type 2 diabetes mellitus without complications: Secondary | ICD-10-CM | POA: Diagnosis not present

## 2017-08-31 DIAGNOSIS — G40909 Epilepsy, unspecified, not intractable, without status epilepticus: Secondary | ICD-10-CM | POA: Diagnosis not present

## 2017-08-31 DIAGNOSIS — F329 Major depressive disorder, single episode, unspecified: Secondary | ICD-10-CM | POA: Diagnosis not present

## 2017-08-31 DIAGNOSIS — Z4800 Encounter for change or removal of nonsurgical wound dressing: Secondary | ICD-10-CM | POA: Diagnosis not present

## 2017-08-31 DIAGNOSIS — Z931 Gastrostomy status: Secondary | ICD-10-CM | POA: Diagnosis not present

## 2017-08-31 DIAGNOSIS — L89612 Pressure ulcer of right heel, stage 2: Secondary | ICD-10-CM | POA: Diagnosis not present

## 2017-08-31 DIAGNOSIS — I69365 Other paralytic syndrome following cerebral infarction, bilateral: Secondary | ICD-10-CM | POA: Diagnosis not present

## 2017-08-31 DIAGNOSIS — D649 Anemia, unspecified: Secondary | ICD-10-CM | POA: Diagnosis not present

## 2017-08-31 DIAGNOSIS — G8250 Quadriplegia, unspecified: Secondary | ICD-10-CM | POA: Diagnosis not present

## 2017-08-31 DIAGNOSIS — Z7401 Bed confinement status: Secondary | ICD-10-CM | POA: Diagnosis not present

## 2017-08-31 DIAGNOSIS — Z7901 Long term (current) use of anticoagulants: Secondary | ICD-10-CM | POA: Diagnosis not present

## 2017-08-31 DIAGNOSIS — Z466 Encounter for fitting and adjustment of urinary device: Secondary | ICD-10-CM | POA: Diagnosis not present

## 2017-08-31 DIAGNOSIS — Z86718 Personal history of other venous thrombosis and embolism: Secondary | ICD-10-CM | POA: Diagnosis not present

## 2017-09-02 DIAGNOSIS — I2510 Atherosclerotic heart disease of native coronary artery without angina pectoris: Secondary | ICD-10-CM | POA: Diagnosis not present

## 2017-09-02 DIAGNOSIS — L89892 Pressure ulcer of other site, stage 2: Secondary | ICD-10-CM | POA: Diagnosis not present

## 2017-09-02 DIAGNOSIS — I69365 Other paralytic syndrome following cerebral infarction, bilateral: Secondary | ICD-10-CM | POA: Diagnosis not present

## 2017-09-02 DIAGNOSIS — G8250 Quadriplegia, unspecified: Secondary | ICD-10-CM | POA: Diagnosis not present

## 2017-09-02 DIAGNOSIS — I69391 Dysphagia following cerebral infarction: Secondary | ICD-10-CM | POA: Diagnosis not present

## 2017-09-02 DIAGNOSIS — L89612 Pressure ulcer of right heel, stage 2: Secondary | ICD-10-CM | POA: Diagnosis not present

## 2017-09-04 DIAGNOSIS — L89892 Pressure ulcer of other site, stage 2: Secondary | ICD-10-CM | POA: Diagnosis not present

## 2017-09-04 DIAGNOSIS — I69365 Other paralytic syndrome following cerebral infarction, bilateral: Secondary | ICD-10-CM | POA: Diagnosis not present

## 2017-09-04 DIAGNOSIS — I2510 Atherosclerotic heart disease of native coronary artery without angina pectoris: Secondary | ICD-10-CM | POA: Diagnosis not present

## 2017-09-04 DIAGNOSIS — L89612 Pressure ulcer of right heel, stage 2: Secondary | ICD-10-CM | POA: Diagnosis not present

## 2017-09-04 DIAGNOSIS — G8250 Quadriplegia, unspecified: Secondary | ICD-10-CM | POA: Diagnosis not present

## 2017-09-04 DIAGNOSIS — I69391 Dysphagia following cerebral infarction: Secondary | ICD-10-CM | POA: Diagnosis not present

## 2017-09-08 DIAGNOSIS — L89612 Pressure ulcer of right heel, stage 2: Secondary | ICD-10-CM | POA: Diagnosis not present

## 2017-09-08 DIAGNOSIS — I69365 Other paralytic syndrome following cerebral infarction, bilateral: Secondary | ICD-10-CM | POA: Diagnosis not present

## 2017-09-08 DIAGNOSIS — L89892 Pressure ulcer of other site, stage 2: Secondary | ICD-10-CM | POA: Diagnosis not present

## 2017-09-08 DIAGNOSIS — I69391 Dysphagia following cerebral infarction: Secondary | ICD-10-CM | POA: Diagnosis not present

## 2017-09-08 DIAGNOSIS — I2510 Atherosclerotic heart disease of native coronary artery without angina pectoris: Secondary | ICD-10-CM | POA: Diagnosis not present

## 2017-09-08 DIAGNOSIS — G8250 Quadriplegia, unspecified: Secondary | ICD-10-CM | POA: Diagnosis not present

## 2017-09-11 DIAGNOSIS — I69365 Other paralytic syndrome following cerebral infarction, bilateral: Secondary | ICD-10-CM | POA: Diagnosis not present

## 2017-09-11 DIAGNOSIS — L89612 Pressure ulcer of right heel, stage 2: Secondary | ICD-10-CM | POA: Diagnosis not present

## 2017-09-11 DIAGNOSIS — G8250 Quadriplegia, unspecified: Secondary | ICD-10-CM | POA: Diagnosis not present

## 2017-09-11 DIAGNOSIS — I69391 Dysphagia following cerebral infarction: Secondary | ICD-10-CM | POA: Diagnosis not present

## 2017-09-11 DIAGNOSIS — I2510 Atherosclerotic heart disease of native coronary artery without angina pectoris: Secondary | ICD-10-CM | POA: Diagnosis not present

## 2017-09-11 DIAGNOSIS — L89892 Pressure ulcer of other site, stage 2: Secondary | ICD-10-CM | POA: Diagnosis not present

## 2017-09-15 DIAGNOSIS — I2510 Atherosclerotic heart disease of native coronary artery without angina pectoris: Secondary | ICD-10-CM | POA: Diagnosis not present

## 2017-09-15 DIAGNOSIS — I69365 Other paralytic syndrome following cerebral infarction, bilateral: Secondary | ICD-10-CM | POA: Diagnosis not present

## 2017-09-15 DIAGNOSIS — G8250 Quadriplegia, unspecified: Secondary | ICD-10-CM | POA: Diagnosis not present

## 2017-09-15 DIAGNOSIS — I69391 Dysphagia following cerebral infarction: Secondary | ICD-10-CM | POA: Diagnosis not present

## 2017-09-15 DIAGNOSIS — L89612 Pressure ulcer of right heel, stage 2: Secondary | ICD-10-CM | POA: Diagnosis not present

## 2017-09-15 DIAGNOSIS — L89892 Pressure ulcer of other site, stage 2: Secondary | ICD-10-CM | POA: Diagnosis not present

## 2017-09-16 DIAGNOSIS — I69365 Other paralytic syndrome following cerebral infarction, bilateral: Secondary | ICD-10-CM | POA: Diagnosis not present

## 2017-09-16 DIAGNOSIS — L89612 Pressure ulcer of right heel, stage 2: Secondary | ICD-10-CM | POA: Diagnosis not present

## 2017-09-16 DIAGNOSIS — G8250 Quadriplegia, unspecified: Secondary | ICD-10-CM | POA: Diagnosis not present

## 2017-09-16 DIAGNOSIS — L89892 Pressure ulcer of other site, stage 2: Secondary | ICD-10-CM | POA: Diagnosis not present

## 2017-09-16 DIAGNOSIS — I2510 Atherosclerotic heart disease of native coronary artery without angina pectoris: Secondary | ICD-10-CM | POA: Diagnosis not present

## 2017-09-16 DIAGNOSIS — I69391 Dysphagia following cerebral infarction: Secondary | ICD-10-CM | POA: Diagnosis not present

## 2017-09-21 DIAGNOSIS — I69365 Other paralytic syndrome following cerebral infarction, bilateral: Secondary | ICD-10-CM | POA: Diagnosis not present

## 2017-09-21 DIAGNOSIS — I69391 Dysphagia following cerebral infarction: Secondary | ICD-10-CM | POA: Diagnosis not present

## 2017-09-21 DIAGNOSIS — I2510 Atherosclerotic heart disease of native coronary artery without angina pectoris: Secondary | ICD-10-CM | POA: Diagnosis not present

## 2017-09-21 DIAGNOSIS — G8250 Quadriplegia, unspecified: Secondary | ICD-10-CM | POA: Diagnosis not present

## 2017-09-21 DIAGNOSIS — L89612 Pressure ulcer of right heel, stage 2: Secondary | ICD-10-CM | POA: Diagnosis not present

## 2017-09-21 DIAGNOSIS — L89892 Pressure ulcer of other site, stage 2: Secondary | ICD-10-CM | POA: Diagnosis not present

## 2017-09-23 DIAGNOSIS — I69391 Dysphagia following cerebral infarction: Secondary | ICD-10-CM | POA: Diagnosis not present

## 2017-09-23 DIAGNOSIS — G8250 Quadriplegia, unspecified: Secondary | ICD-10-CM | POA: Diagnosis not present

## 2017-09-23 DIAGNOSIS — I2510 Atherosclerotic heart disease of native coronary artery without angina pectoris: Secondary | ICD-10-CM | POA: Diagnosis not present

## 2017-09-23 DIAGNOSIS — L89612 Pressure ulcer of right heel, stage 2: Secondary | ICD-10-CM | POA: Diagnosis not present

## 2017-09-23 DIAGNOSIS — I69365 Other paralytic syndrome following cerebral infarction, bilateral: Secondary | ICD-10-CM | POA: Diagnosis not present

## 2017-09-23 DIAGNOSIS — L89892 Pressure ulcer of other site, stage 2: Secondary | ICD-10-CM | POA: Diagnosis not present

## 2017-09-25 DIAGNOSIS — L89612 Pressure ulcer of right heel, stage 2: Secondary | ICD-10-CM | POA: Diagnosis not present

## 2017-09-25 DIAGNOSIS — G8250 Quadriplegia, unspecified: Secondary | ICD-10-CM | POA: Diagnosis not present

## 2017-09-25 DIAGNOSIS — L89892 Pressure ulcer of other site, stage 2: Secondary | ICD-10-CM | POA: Diagnosis not present

## 2017-09-25 DIAGNOSIS — I69365 Other paralytic syndrome following cerebral infarction, bilateral: Secondary | ICD-10-CM | POA: Diagnosis not present

## 2017-09-25 DIAGNOSIS — I2510 Atherosclerotic heart disease of native coronary artery without angina pectoris: Secondary | ICD-10-CM | POA: Diagnosis not present

## 2017-09-25 DIAGNOSIS — I69391 Dysphagia following cerebral infarction: Secondary | ICD-10-CM | POA: Diagnosis not present

## 2017-09-30 DIAGNOSIS — I69391 Dysphagia following cerebral infarction: Secondary | ICD-10-CM | POA: Diagnosis not present

## 2017-09-30 DIAGNOSIS — I69365 Other paralytic syndrome following cerebral infarction, bilateral: Secondary | ICD-10-CM | POA: Diagnosis not present

## 2017-09-30 DIAGNOSIS — G8250 Quadriplegia, unspecified: Secondary | ICD-10-CM | POA: Diagnosis not present

## 2017-09-30 DIAGNOSIS — L89892 Pressure ulcer of other site, stage 2: Secondary | ICD-10-CM | POA: Diagnosis not present

## 2017-09-30 DIAGNOSIS — I2510 Atherosclerotic heart disease of native coronary artery without angina pectoris: Secondary | ICD-10-CM | POA: Diagnosis not present

## 2017-09-30 DIAGNOSIS — L89612 Pressure ulcer of right heel, stage 2: Secondary | ICD-10-CM | POA: Diagnosis not present

## 2017-10-05 DIAGNOSIS — I69365 Other paralytic syndrome following cerebral infarction, bilateral: Secondary | ICD-10-CM | POA: Diagnosis not present

## 2017-10-05 DIAGNOSIS — I2510 Atherosclerotic heart disease of native coronary artery without angina pectoris: Secondary | ICD-10-CM | POA: Diagnosis not present

## 2017-10-05 DIAGNOSIS — I69391 Dysphagia following cerebral infarction: Secondary | ICD-10-CM | POA: Diagnosis not present

## 2017-10-05 DIAGNOSIS — L89892 Pressure ulcer of other site, stage 2: Secondary | ICD-10-CM | POA: Diagnosis not present

## 2017-10-05 DIAGNOSIS — L89612 Pressure ulcer of right heel, stage 2: Secondary | ICD-10-CM | POA: Diagnosis not present

## 2017-10-05 DIAGNOSIS — G8250 Quadriplegia, unspecified: Secondary | ICD-10-CM | POA: Diagnosis not present

## 2017-10-07 DIAGNOSIS — L89612 Pressure ulcer of right heel, stage 2: Secondary | ICD-10-CM | POA: Diagnosis not present

## 2017-10-07 DIAGNOSIS — G8250 Quadriplegia, unspecified: Secondary | ICD-10-CM | POA: Diagnosis not present

## 2017-10-07 DIAGNOSIS — I69391 Dysphagia following cerebral infarction: Secondary | ICD-10-CM | POA: Diagnosis not present

## 2017-10-07 DIAGNOSIS — I2510 Atherosclerotic heart disease of native coronary artery without angina pectoris: Secondary | ICD-10-CM | POA: Diagnosis not present

## 2017-10-07 DIAGNOSIS — L89892 Pressure ulcer of other site, stage 2: Secondary | ICD-10-CM | POA: Diagnosis not present

## 2017-10-07 DIAGNOSIS — I69365 Other paralytic syndrome following cerebral infarction, bilateral: Secondary | ICD-10-CM | POA: Diagnosis not present

## 2017-10-09 DIAGNOSIS — L89892 Pressure ulcer of other site, stage 2: Secondary | ICD-10-CM | POA: Diagnosis not present

## 2017-10-09 DIAGNOSIS — L89612 Pressure ulcer of right heel, stage 2: Secondary | ICD-10-CM | POA: Diagnosis not present

## 2017-10-09 DIAGNOSIS — G8250 Quadriplegia, unspecified: Secondary | ICD-10-CM | POA: Diagnosis not present

## 2017-10-09 DIAGNOSIS — I69391 Dysphagia following cerebral infarction: Secondary | ICD-10-CM | POA: Diagnosis not present

## 2017-10-09 DIAGNOSIS — I69365 Other paralytic syndrome following cerebral infarction, bilateral: Secondary | ICD-10-CM | POA: Diagnosis not present

## 2017-10-09 DIAGNOSIS — I2510 Atherosclerotic heart disease of native coronary artery without angina pectoris: Secondary | ICD-10-CM | POA: Diagnosis not present

## 2017-10-12 DIAGNOSIS — L89892 Pressure ulcer of other site, stage 2: Secondary | ICD-10-CM | POA: Diagnosis not present

## 2017-10-12 DIAGNOSIS — I2510 Atherosclerotic heart disease of native coronary artery without angina pectoris: Secondary | ICD-10-CM | POA: Diagnosis not present

## 2017-10-12 DIAGNOSIS — I69365 Other paralytic syndrome following cerebral infarction, bilateral: Secondary | ICD-10-CM | POA: Diagnosis not present

## 2017-10-12 DIAGNOSIS — L89612 Pressure ulcer of right heel, stage 2: Secondary | ICD-10-CM | POA: Diagnosis not present

## 2017-10-12 DIAGNOSIS — I69391 Dysphagia following cerebral infarction: Secondary | ICD-10-CM | POA: Diagnosis not present

## 2017-10-12 DIAGNOSIS — G8250 Quadriplegia, unspecified: Secondary | ICD-10-CM | POA: Diagnosis not present

## 2017-10-14 DIAGNOSIS — L89612 Pressure ulcer of right heel, stage 2: Secondary | ICD-10-CM | POA: Diagnosis not present

## 2017-10-14 DIAGNOSIS — G8250 Quadriplegia, unspecified: Secondary | ICD-10-CM | POA: Diagnosis not present

## 2017-10-14 DIAGNOSIS — I69365 Other paralytic syndrome following cerebral infarction, bilateral: Secondary | ICD-10-CM | POA: Diagnosis not present

## 2017-10-14 DIAGNOSIS — L89892 Pressure ulcer of other site, stage 2: Secondary | ICD-10-CM | POA: Diagnosis not present

## 2017-10-14 DIAGNOSIS — I2510 Atherosclerotic heart disease of native coronary artery without angina pectoris: Secondary | ICD-10-CM | POA: Diagnosis not present

## 2017-10-14 DIAGNOSIS — I69391 Dysphagia following cerebral infarction: Secondary | ICD-10-CM | POA: Diagnosis not present

## 2017-10-16 DIAGNOSIS — I69365 Other paralytic syndrome following cerebral infarction, bilateral: Secondary | ICD-10-CM | POA: Diagnosis not present

## 2017-10-16 DIAGNOSIS — L89612 Pressure ulcer of right heel, stage 2: Secondary | ICD-10-CM | POA: Diagnosis not present

## 2017-10-16 DIAGNOSIS — I69391 Dysphagia following cerebral infarction: Secondary | ICD-10-CM | POA: Diagnosis not present

## 2017-10-16 DIAGNOSIS — I2510 Atherosclerotic heart disease of native coronary artery without angina pectoris: Secondary | ICD-10-CM | POA: Diagnosis not present

## 2017-10-16 DIAGNOSIS — L89892 Pressure ulcer of other site, stage 2: Secondary | ICD-10-CM | POA: Diagnosis not present

## 2017-10-16 DIAGNOSIS — G8250 Quadriplegia, unspecified: Secondary | ICD-10-CM | POA: Diagnosis not present

## 2017-10-18 DIAGNOSIS — L8995 Pressure ulcer of unspecified site, unstageable: Secondary | ICD-10-CM | POA: Diagnosis not present

## 2017-10-18 DIAGNOSIS — R8271 Bacteriuria: Secondary | ICD-10-CM | POA: Diagnosis not present

## 2017-10-18 DIAGNOSIS — L89611 Pressure ulcer of right heel, stage 1: Secondary | ICD-10-CM | POA: Diagnosis not present

## 2017-10-18 DIAGNOSIS — R1312 Dysphagia, oropharyngeal phase: Secondary | ICD-10-CM | POA: Diagnosis present

## 2017-10-18 DIAGNOSIS — L8990 Pressure ulcer of unspecified site, unspecified stage: Secondary | ICD-10-CM | POA: Diagnosis present

## 2017-10-18 DIAGNOSIS — Z452 Encounter for adjustment and management of vascular access device: Secondary | ICD-10-CM | POA: Diagnosis not present

## 2017-10-18 DIAGNOSIS — I509 Heart failure, unspecified: Secondary | ICD-10-CM | POA: Diagnosis present

## 2017-10-18 DIAGNOSIS — Z741 Need for assistance with personal care: Secondary | ICD-10-CM | POA: Diagnosis present

## 2017-10-18 DIAGNOSIS — L89893 Pressure ulcer of other site, stage 3: Secondary | ICD-10-CM | POA: Diagnosis not present

## 2017-10-18 DIAGNOSIS — R7881 Bacteremia: Secondary | ICD-10-CM | POA: Diagnosis not present

## 2017-10-18 DIAGNOSIS — L89621 Pressure ulcer of left heel, stage 1: Secondary | ICD-10-CM | POA: Diagnosis not present

## 2017-10-18 DIAGNOSIS — L89899 Pressure ulcer of other site, unspecified stage: Secondary | ICD-10-CM | POA: Diagnosis not present

## 2017-10-18 DIAGNOSIS — L89892 Pressure ulcer of other site, stage 2: Secondary | ICD-10-CM | POA: Diagnosis present

## 2017-10-18 DIAGNOSIS — N3001 Acute cystitis with hematuria: Secondary | ICD-10-CM | POA: Diagnosis not present

## 2017-10-18 DIAGNOSIS — R2689 Other abnormalities of gait and mobility: Secondary | ICD-10-CM | POA: Diagnosis present

## 2017-10-18 DIAGNOSIS — N318 Other neuromuscular dysfunction of bladder: Secondary | ICD-10-CM | POA: Diagnosis present

## 2017-10-18 DIAGNOSIS — I2510 Atherosclerotic heart disease of native coronary artery without angina pectoris: Secondary | ICD-10-CM | POA: Diagnosis present

## 2017-10-18 DIAGNOSIS — B9561 Methicillin susceptible Staphylococcus aureus infection as the cause of diseases classified elsewhere: Secondary | ICD-10-CM | POA: Diagnosis present

## 2017-10-18 DIAGNOSIS — I69365 Other paralytic syndrome following cerebral infarction, bilateral: Secondary | ICD-10-CM | POA: Diagnosis not present

## 2017-10-18 DIAGNOSIS — L89322 Pressure ulcer of left buttock, stage 2: Secondary | ICD-10-CM | POA: Diagnosis present

## 2017-10-18 DIAGNOSIS — Z7401 Bed confinement status: Secondary | ICD-10-CM | POA: Diagnosis not present

## 2017-10-18 DIAGNOSIS — I69369 Other paralytic syndrome following cerebral infarction affecting unspecified side: Secondary | ICD-10-CM | POA: Diagnosis not present

## 2017-10-18 DIAGNOSIS — B9629 Other Escherichia coli [E. coli] as the cause of diseases classified elsewhere: Secondary | ICD-10-CM | POA: Diagnosis not present

## 2017-10-18 DIAGNOSIS — R531 Weakness: Secondary | ICD-10-CM | POA: Diagnosis not present

## 2017-10-18 DIAGNOSIS — L089 Local infection of the skin and subcutaneous tissue, unspecified: Secondary | ICD-10-CM | POA: Diagnosis not present

## 2017-10-18 DIAGNOSIS — Z515 Encounter for palliative care: Secondary | ICD-10-CM | POA: Diagnosis not present

## 2017-10-18 DIAGNOSIS — L89623 Pressure ulcer of left heel, stage 3: Secondary | ICD-10-CM | POA: Diagnosis not present

## 2017-10-18 DIAGNOSIS — R569 Unspecified convulsions: Secondary | ICD-10-CM | POA: Diagnosis not present

## 2017-10-18 DIAGNOSIS — B965 Pseudomonas (aeruginosa) (mallei) (pseudomallei) as the cause of diseases classified elsewhere: Secondary | ICD-10-CM | POA: Diagnosis present

## 2017-10-18 DIAGNOSIS — E119 Type 2 diabetes mellitus without complications: Secondary | ICD-10-CM | POA: Diagnosis present

## 2017-10-18 DIAGNOSIS — L89629 Pressure ulcer of left heel, unspecified stage: Secondary | ICD-10-CM | POA: Diagnosis not present

## 2017-10-18 DIAGNOSIS — G40909 Epilepsy, unspecified, not intractable, without status epilepticus: Secondary | ICD-10-CM | POA: Diagnosis present

## 2017-10-18 DIAGNOSIS — I110 Hypertensive heart disease with heart failure: Secondary | ICD-10-CM | POA: Diagnosis present

## 2017-10-18 DIAGNOSIS — N390 Urinary tract infection, site not specified: Secondary | ICD-10-CM | POA: Diagnosis present

## 2017-10-18 DIAGNOSIS — G8250 Quadriplegia, unspecified: Secondary | ICD-10-CM | POA: Diagnosis present

## 2017-10-18 DIAGNOSIS — I69322 Dysarthria following cerebral infarction: Secondary | ICD-10-CM | POA: Diagnosis not present

## 2017-10-18 DIAGNOSIS — L89159 Pressure ulcer of sacral region, unspecified stage: Secondary | ICD-10-CM | POA: Diagnosis present

## 2017-10-18 DIAGNOSIS — Z87891 Personal history of nicotine dependence: Secondary | ICD-10-CM | POA: Diagnosis not present

## 2017-10-18 DIAGNOSIS — Z951 Presence of aortocoronary bypass graft: Secondary | ICD-10-CM | POA: Diagnosis not present

## 2017-10-18 DIAGNOSIS — Z7901 Long term (current) use of anticoagulants: Secondary | ICD-10-CM | POA: Diagnosis not present

## 2017-10-18 DIAGNOSIS — Z743 Need for continuous supervision: Secondary | ICD-10-CM | POA: Diagnosis not present

## 2017-10-18 DIAGNOSIS — R1310 Dysphagia, unspecified: Secondary | ICD-10-CM | POA: Diagnosis present

## 2017-10-18 DIAGNOSIS — E440 Moderate protein-calorie malnutrition: Secondary | ICD-10-CM | POA: Diagnosis not present

## 2017-10-18 DIAGNOSIS — M6281 Muscle weakness (generalized): Secondary | ICD-10-CM | POA: Diagnosis present

## 2017-10-18 DIAGNOSIS — R319 Hematuria, unspecified: Secondary | ICD-10-CM | POA: Diagnosis not present

## 2017-10-18 DIAGNOSIS — N319 Neuromuscular dysfunction of bladder, unspecified: Secondary | ICD-10-CM | POA: Diagnosis not present

## 2017-10-18 DIAGNOSIS — N210 Calculus in bladder: Secondary | ICD-10-CM | POA: Diagnosis not present

## 2017-10-18 DIAGNOSIS — L89613 Pressure ulcer of right heel, stage 3: Secondary | ICD-10-CM | POA: Diagnosis not present

## 2017-10-18 DIAGNOSIS — R279 Unspecified lack of coordination: Secondary | ICD-10-CM | POA: Diagnosis not present

## 2017-10-18 DIAGNOSIS — I69398 Other sequelae of cerebral infarction: Secondary | ICD-10-CM | POA: Diagnosis not present

## 2017-10-18 DIAGNOSIS — N202 Calculus of kidney with calculus of ureter: Secondary | ICD-10-CM | POA: Diagnosis present

## 2017-10-18 DIAGNOSIS — L89619 Pressure ulcer of right heel, unspecified stage: Secondary | ICD-10-CM | POA: Diagnosis not present

## 2017-10-22 DIAGNOSIS — L89159 Pressure ulcer of sacral region, unspecified stage: Secondary | ICD-10-CM | POA: Diagnosis not present

## 2017-10-22 DIAGNOSIS — I69318 Other symptoms and signs involving cognitive functions following cerebral infarction: Secondary | ICD-10-CM | POA: Diagnosis not present

## 2017-10-22 DIAGNOSIS — N390 Urinary tract infection, site not specified: Secondary | ICD-10-CM | POA: Diagnosis present

## 2017-10-22 DIAGNOSIS — I110 Hypertensive heart disease with heart failure: Secondary | ICD-10-CM | POA: Diagnosis not present

## 2017-10-22 DIAGNOSIS — I69391 Dysphagia following cerebral infarction: Secondary | ICD-10-CM | POA: Diagnosis not present

## 2017-10-22 DIAGNOSIS — Z431 Encounter for attention to gastrostomy: Secondary | ICD-10-CM | POA: Diagnosis not present

## 2017-10-22 DIAGNOSIS — R279 Unspecified lack of coordination: Secondary | ICD-10-CM | POA: Diagnosis not present

## 2017-10-22 DIAGNOSIS — I509 Heart failure, unspecified: Secondary | ICD-10-CM | POA: Diagnosis not present

## 2017-10-22 DIAGNOSIS — Z741 Need for assistance with personal care: Secondary | ICD-10-CM | POA: Diagnosis present

## 2017-10-22 DIAGNOSIS — R270 Ataxia, unspecified: Secondary | ICD-10-CM | POA: Diagnosis not present

## 2017-10-22 DIAGNOSIS — R6889 Other general symptoms and signs: Secondary | ICD-10-CM | POA: Diagnosis not present

## 2017-10-22 DIAGNOSIS — N318 Other neuromuscular dysfunction of bladder: Secondary | ICD-10-CM | POA: Diagnosis not present

## 2017-10-22 DIAGNOSIS — K9423 Gastrostomy malfunction: Secondary | ICD-10-CM | POA: Diagnosis not present

## 2017-10-22 DIAGNOSIS — L89619 Pressure ulcer of right heel, unspecified stage: Secondary | ICD-10-CM | POA: Diagnosis not present

## 2017-10-22 DIAGNOSIS — N210 Calculus in bladder: Secondary | ICD-10-CM | POA: Diagnosis not present

## 2017-10-22 DIAGNOSIS — G8250 Quadriplegia, unspecified: Secondary | ICD-10-CM | POA: Diagnosis not present

## 2017-10-22 DIAGNOSIS — L89629 Pressure ulcer of left heel, unspecified stage: Secondary | ICD-10-CM | POA: Diagnosis not present

## 2017-10-22 DIAGNOSIS — R319 Hematuria, unspecified: Secondary | ICD-10-CM | POA: Diagnosis not present

## 2017-10-22 DIAGNOSIS — L8995 Pressure ulcer of unspecified site, unstageable: Secondary | ICD-10-CM | POA: Diagnosis not present

## 2017-10-22 DIAGNOSIS — R1312 Dysphagia, oropharyngeal phase: Secondary | ICD-10-CM | POA: Diagnosis present

## 2017-10-22 DIAGNOSIS — B965 Pseudomonas (aeruginosa) (mallei) (pseudomallei) as the cause of diseases classified elsewhere: Secondary | ICD-10-CM | POA: Diagnosis present

## 2017-10-22 DIAGNOSIS — G40909 Epilepsy, unspecified, not intractable, without status epilepticus: Secondary | ICD-10-CM | POA: Diagnosis not present

## 2017-10-22 DIAGNOSIS — Z743 Need for continuous supervision: Secondary | ICD-10-CM | POA: Diagnosis not present

## 2017-10-22 DIAGNOSIS — Z515 Encounter for palliative care: Secondary | ICD-10-CM | POA: Diagnosis not present

## 2017-10-22 DIAGNOSIS — B9561 Methicillin susceptible Staphylococcus aureus infection as the cause of diseases classified elsewhere: Secondary | ICD-10-CM | POA: Diagnosis not present

## 2017-10-22 DIAGNOSIS — I69369 Other paralytic syndrome following cerebral infarction affecting unspecified side: Secondary | ICD-10-CM | POA: Diagnosis not present

## 2017-10-22 DIAGNOSIS — E440 Moderate protein-calorie malnutrition: Secondary | ICD-10-CM | POA: Diagnosis present

## 2017-10-22 DIAGNOSIS — R2689 Other abnormalities of gait and mobility: Secondary | ICD-10-CM | POA: Diagnosis present

## 2017-10-22 DIAGNOSIS — L89899 Pressure ulcer of other site, unspecified stage: Secondary | ICD-10-CM | POA: Diagnosis not present

## 2017-10-22 DIAGNOSIS — I2510 Atherosclerotic heart disease of native coronary artery without angina pectoris: Secondary | ICD-10-CM | POA: Diagnosis not present

## 2017-10-22 DIAGNOSIS — L8990 Pressure ulcer of unspecified site, unspecified stage: Secondary | ICD-10-CM | POA: Diagnosis present

## 2017-10-22 DIAGNOSIS — M6281 Muscle weakness (generalized): Secondary | ICD-10-CM | POA: Diagnosis present

## 2017-10-22 DIAGNOSIS — I69398 Other sequelae of cerebral infarction: Secondary | ICD-10-CM | POA: Diagnosis not present

## 2017-10-22 DIAGNOSIS — Z7901 Long term (current) use of anticoagulants: Secondary | ICD-10-CM | POA: Diagnosis not present

## 2017-10-22 DIAGNOSIS — R1310 Dysphagia, unspecified: Secondary | ICD-10-CM | POA: Diagnosis not present

## 2017-10-23 DIAGNOSIS — N318 Other neuromuscular dysfunction of bladder: Secondary | ICD-10-CM | POA: Diagnosis not present

## 2017-10-23 DIAGNOSIS — I69318 Other symptoms and signs involving cognitive functions following cerebral infarction: Secondary | ICD-10-CM | POA: Diagnosis not present

## 2017-10-23 DIAGNOSIS — I69391 Dysphagia following cerebral infarction: Secondary | ICD-10-CM | POA: Diagnosis not present

## 2017-10-28 DIAGNOSIS — K9423 Gastrostomy malfunction: Secondary | ICD-10-CM | POA: Diagnosis not present

## 2017-10-28 DIAGNOSIS — Z431 Encounter for attention to gastrostomy: Secondary | ICD-10-CM | POA: Diagnosis not present

## 2017-11-12 DIAGNOSIS — M86179 Other acute osteomyelitis, unspecified ankle and foot: Secondary | ICD-10-CM | POA: Diagnosis not present

## 2017-11-12 DIAGNOSIS — I5022 Chronic systolic (congestive) heart failure: Secondary | ICD-10-CM | POA: Diagnosis not present

## 2017-11-12 DIAGNOSIS — E119 Type 2 diabetes mellitus without complications: Secondary | ICD-10-CM | POA: Diagnosis not present

## 2017-11-12 DIAGNOSIS — Z7901 Long term (current) use of anticoagulants: Secondary | ICD-10-CM | POA: Diagnosis not present

## 2017-11-12 DIAGNOSIS — Z0000 Encounter for general adult medical examination without abnormal findings: Secondary | ICD-10-CM | POA: Diagnosis not present

## 2017-11-12 DIAGNOSIS — Z789 Other specified health status: Secondary | ICD-10-CM | POA: Diagnosis not present

## 2017-11-12 DIAGNOSIS — L8990 Pressure ulcer of unspecified site, unspecified stage: Secondary | ICD-10-CM | POA: Diagnosis not present

## 2017-11-17 DIAGNOSIS — I110 Hypertensive heart disease with heart failure: Secondary | ICD-10-CM | POA: Diagnosis not present

## 2017-11-17 DIAGNOSIS — Z993 Dependence on wheelchair: Secondary | ICD-10-CM | POA: Diagnosis not present

## 2017-11-17 DIAGNOSIS — L89892 Pressure ulcer of other site, stage 2: Secondary | ICD-10-CM | POA: Diagnosis not present

## 2017-11-17 DIAGNOSIS — I5022 Chronic systolic (congestive) heart failure: Secondary | ICD-10-CM | POA: Diagnosis not present

## 2017-11-17 DIAGNOSIS — Z7901 Long term (current) use of anticoagulants: Secondary | ICD-10-CM | POA: Diagnosis not present

## 2017-11-17 DIAGNOSIS — G40909 Epilepsy, unspecified, not intractable, without status epilepticus: Secondary | ICD-10-CM | POA: Diagnosis not present

## 2017-11-17 DIAGNOSIS — L89322 Pressure ulcer of left buttock, stage 2: Secondary | ICD-10-CM | POA: Diagnosis not present

## 2017-11-17 DIAGNOSIS — F329 Major depressive disorder, single episode, unspecified: Secondary | ICD-10-CM | POA: Diagnosis not present

## 2017-11-17 DIAGNOSIS — Z466 Encounter for fitting and adjustment of urinary device: Secondary | ICD-10-CM | POA: Diagnosis not present

## 2017-11-17 DIAGNOSIS — R1310 Dysphagia, unspecified: Secondary | ICD-10-CM | POA: Diagnosis not present

## 2017-11-17 DIAGNOSIS — E119 Type 2 diabetes mellitus without complications: Secondary | ICD-10-CM | POA: Diagnosis not present

## 2017-11-17 DIAGNOSIS — Z86718 Personal history of other venous thrombosis and embolism: Secondary | ICD-10-CM | POA: Diagnosis not present

## 2017-11-17 DIAGNOSIS — Z4800 Encounter for change or removal of nonsurgical wound dressing: Secondary | ICD-10-CM | POA: Diagnosis not present

## 2017-11-17 DIAGNOSIS — L89622 Pressure ulcer of left heel, stage 2: Secondary | ICD-10-CM | POA: Diagnosis not present

## 2017-11-17 DIAGNOSIS — I69351 Hemiplegia and hemiparesis following cerebral infarction affecting right dominant side: Secondary | ICD-10-CM | POA: Diagnosis not present

## 2017-11-17 DIAGNOSIS — L89890 Pressure ulcer of other site, unstageable: Secondary | ICD-10-CM | POA: Diagnosis not present

## 2017-11-17 DIAGNOSIS — L89612 Pressure ulcer of right heel, stage 2: Secondary | ICD-10-CM | POA: Diagnosis not present

## 2017-11-17 DIAGNOSIS — I69393 Ataxia following cerebral infarction: Secondary | ICD-10-CM | POA: Diagnosis not present

## 2017-11-17 DIAGNOSIS — Z431 Encounter for attention to gastrostomy: Secondary | ICD-10-CM | POA: Diagnosis not present

## 2017-11-17 DIAGNOSIS — I69391 Dysphagia following cerebral infarction: Secondary | ICD-10-CM | POA: Diagnosis not present

## 2017-11-17 DIAGNOSIS — Z87891 Personal history of nicotine dependence: Secondary | ICD-10-CM | POA: Diagnosis not present

## 2017-11-23 DIAGNOSIS — L89890 Pressure ulcer of other site, unstageable: Secondary | ICD-10-CM | POA: Diagnosis not present

## 2017-11-23 DIAGNOSIS — L89322 Pressure ulcer of left buttock, stage 2: Secondary | ICD-10-CM | POA: Diagnosis not present

## 2017-11-23 DIAGNOSIS — E119 Type 2 diabetes mellitus without complications: Secondary | ICD-10-CM | POA: Diagnosis not present

## 2017-11-23 DIAGNOSIS — L89612 Pressure ulcer of right heel, stage 2: Secondary | ICD-10-CM | POA: Diagnosis not present

## 2017-11-23 DIAGNOSIS — L89622 Pressure ulcer of left heel, stage 2: Secondary | ICD-10-CM | POA: Diagnosis not present

## 2017-11-23 DIAGNOSIS — L89892 Pressure ulcer of other site, stage 2: Secondary | ICD-10-CM | POA: Diagnosis not present

## 2017-11-24 DIAGNOSIS — L89622 Pressure ulcer of left heel, stage 2: Secondary | ICD-10-CM | POA: Diagnosis not present

## 2017-11-24 DIAGNOSIS — L89612 Pressure ulcer of right heel, stage 2: Secondary | ICD-10-CM | POA: Diagnosis not present

## 2017-11-24 DIAGNOSIS — E119 Type 2 diabetes mellitus without complications: Secondary | ICD-10-CM | POA: Diagnosis not present

## 2017-11-24 DIAGNOSIS — L89890 Pressure ulcer of other site, unstageable: Secondary | ICD-10-CM | POA: Diagnosis not present

## 2017-11-24 DIAGNOSIS — L89892 Pressure ulcer of other site, stage 2: Secondary | ICD-10-CM | POA: Diagnosis not present

## 2017-11-24 DIAGNOSIS — L89322 Pressure ulcer of left buttock, stage 2: Secondary | ICD-10-CM | POA: Diagnosis not present

## 2017-11-25 DIAGNOSIS — L89890 Pressure ulcer of other site, unstageable: Secondary | ICD-10-CM | POA: Diagnosis not present

## 2017-11-25 DIAGNOSIS — L89622 Pressure ulcer of left heel, stage 2: Secondary | ICD-10-CM | POA: Diagnosis not present

## 2017-11-25 DIAGNOSIS — E119 Type 2 diabetes mellitus without complications: Secondary | ICD-10-CM | POA: Diagnosis not present

## 2017-11-25 DIAGNOSIS — L89612 Pressure ulcer of right heel, stage 2: Secondary | ICD-10-CM | POA: Diagnosis not present

## 2017-11-25 DIAGNOSIS — L89892 Pressure ulcer of other site, stage 2: Secondary | ICD-10-CM | POA: Diagnosis not present

## 2017-11-25 DIAGNOSIS — L89322 Pressure ulcer of left buttock, stage 2: Secondary | ICD-10-CM | POA: Diagnosis not present

## 2017-11-26 DIAGNOSIS — I69365 Other paralytic syndrome following cerebral infarction, bilateral: Secondary | ICD-10-CM | POA: Diagnosis not present

## 2017-11-26 DIAGNOSIS — L89890 Pressure ulcer of other site, unstageable: Secondary | ICD-10-CM | POA: Diagnosis not present

## 2017-11-26 DIAGNOSIS — L89622 Pressure ulcer of left heel, stage 2: Secondary | ICD-10-CM | POA: Diagnosis not present

## 2017-11-26 DIAGNOSIS — L89892 Pressure ulcer of other site, stage 2: Secondary | ICD-10-CM | POA: Diagnosis not present

## 2017-11-26 DIAGNOSIS — L89612 Pressure ulcer of right heel, stage 2: Secondary | ICD-10-CM | POA: Diagnosis not present

## 2017-11-26 DIAGNOSIS — L89322 Pressure ulcer of left buttock, stage 2: Secondary | ICD-10-CM | POA: Diagnosis not present

## 2017-11-26 DIAGNOSIS — E119 Type 2 diabetes mellitus without complications: Secondary | ICD-10-CM | POA: Diagnosis not present

## 2017-11-27 DIAGNOSIS — L89612 Pressure ulcer of right heel, stage 2: Secondary | ICD-10-CM | POA: Diagnosis not present

## 2017-11-27 DIAGNOSIS — L89622 Pressure ulcer of left heel, stage 2: Secondary | ICD-10-CM | POA: Diagnosis not present

## 2017-11-27 DIAGNOSIS — L89892 Pressure ulcer of other site, stage 2: Secondary | ICD-10-CM | POA: Diagnosis not present

## 2017-11-27 DIAGNOSIS — E119 Type 2 diabetes mellitus without complications: Secondary | ICD-10-CM | POA: Diagnosis not present

## 2017-11-27 DIAGNOSIS — L89890 Pressure ulcer of other site, unstageable: Secondary | ICD-10-CM | POA: Diagnosis not present

## 2017-11-27 DIAGNOSIS — L89322 Pressure ulcer of left buttock, stage 2: Secondary | ICD-10-CM | POA: Diagnosis not present

## 2017-11-30 DIAGNOSIS — E119 Type 2 diabetes mellitus without complications: Secondary | ICD-10-CM | POA: Diagnosis not present

## 2017-11-30 DIAGNOSIS — L89322 Pressure ulcer of left buttock, stage 2: Secondary | ICD-10-CM | POA: Diagnosis not present

## 2017-11-30 DIAGNOSIS — L89612 Pressure ulcer of right heel, stage 2: Secondary | ICD-10-CM | POA: Diagnosis not present

## 2017-11-30 DIAGNOSIS — L89622 Pressure ulcer of left heel, stage 2: Secondary | ICD-10-CM | POA: Diagnosis not present

## 2017-11-30 DIAGNOSIS — L89892 Pressure ulcer of other site, stage 2: Secondary | ICD-10-CM | POA: Diagnosis not present

## 2017-11-30 DIAGNOSIS — L89890 Pressure ulcer of other site, unstageable: Secondary | ICD-10-CM | POA: Diagnosis not present

## 2017-12-01 DIAGNOSIS — L89890 Pressure ulcer of other site, unstageable: Secondary | ICD-10-CM | POA: Diagnosis not present

## 2017-12-01 DIAGNOSIS — L89892 Pressure ulcer of other site, stage 2: Secondary | ICD-10-CM | POA: Diagnosis not present

## 2017-12-01 DIAGNOSIS — L89622 Pressure ulcer of left heel, stage 2: Secondary | ICD-10-CM | POA: Diagnosis not present

## 2017-12-01 DIAGNOSIS — L89322 Pressure ulcer of left buttock, stage 2: Secondary | ICD-10-CM | POA: Diagnosis not present

## 2017-12-01 DIAGNOSIS — L89612 Pressure ulcer of right heel, stage 2: Secondary | ICD-10-CM | POA: Diagnosis not present

## 2017-12-01 DIAGNOSIS — E119 Type 2 diabetes mellitus without complications: Secondary | ICD-10-CM | POA: Diagnosis not present

## 2017-12-02 DIAGNOSIS — L89890 Pressure ulcer of other site, unstageable: Secondary | ICD-10-CM | POA: Diagnosis not present

## 2017-12-02 DIAGNOSIS — E119 Type 2 diabetes mellitus without complications: Secondary | ICD-10-CM | POA: Diagnosis not present

## 2017-12-02 DIAGNOSIS — L89612 Pressure ulcer of right heel, stage 2: Secondary | ICD-10-CM | POA: Diagnosis not present

## 2017-12-02 DIAGNOSIS — L89892 Pressure ulcer of other site, stage 2: Secondary | ICD-10-CM | POA: Diagnosis not present

## 2017-12-02 DIAGNOSIS — L89322 Pressure ulcer of left buttock, stage 2: Secondary | ICD-10-CM | POA: Diagnosis not present

## 2017-12-02 DIAGNOSIS — L89622 Pressure ulcer of left heel, stage 2: Secondary | ICD-10-CM | POA: Diagnosis not present

## 2017-12-04 DIAGNOSIS — L89612 Pressure ulcer of right heel, stage 2: Secondary | ICD-10-CM | POA: Diagnosis not present

## 2017-12-04 DIAGNOSIS — L89892 Pressure ulcer of other site, stage 2: Secondary | ICD-10-CM | POA: Diagnosis not present

## 2017-12-04 DIAGNOSIS — E119 Type 2 diabetes mellitus without complications: Secondary | ICD-10-CM | POA: Diagnosis not present

## 2017-12-04 DIAGNOSIS — L89890 Pressure ulcer of other site, unstageable: Secondary | ICD-10-CM | POA: Diagnosis not present

## 2017-12-04 DIAGNOSIS — L89622 Pressure ulcer of left heel, stage 2: Secondary | ICD-10-CM | POA: Diagnosis not present

## 2017-12-04 DIAGNOSIS — L89322 Pressure ulcer of left buttock, stage 2: Secondary | ICD-10-CM | POA: Diagnosis not present

## 2017-12-07 DIAGNOSIS — L89892 Pressure ulcer of other site, stage 2: Secondary | ICD-10-CM | POA: Diagnosis not present

## 2017-12-07 DIAGNOSIS — L89622 Pressure ulcer of left heel, stage 2: Secondary | ICD-10-CM | POA: Diagnosis not present

## 2017-12-07 DIAGNOSIS — L89612 Pressure ulcer of right heel, stage 2: Secondary | ICD-10-CM | POA: Diagnosis not present

## 2017-12-07 DIAGNOSIS — L89322 Pressure ulcer of left buttock, stage 2: Secondary | ICD-10-CM | POA: Diagnosis not present

## 2017-12-07 DIAGNOSIS — E119 Type 2 diabetes mellitus without complications: Secondary | ICD-10-CM | POA: Diagnosis not present

## 2017-12-07 DIAGNOSIS — L89890 Pressure ulcer of other site, unstageable: Secondary | ICD-10-CM | POA: Diagnosis not present

## 2017-12-08 DIAGNOSIS — L89322 Pressure ulcer of left buttock, stage 2: Secondary | ICD-10-CM | POA: Diagnosis not present

## 2017-12-08 DIAGNOSIS — L89890 Pressure ulcer of other site, unstageable: Secondary | ICD-10-CM | POA: Diagnosis not present

## 2017-12-08 DIAGNOSIS — L89892 Pressure ulcer of other site, stage 2: Secondary | ICD-10-CM | POA: Diagnosis not present

## 2017-12-08 DIAGNOSIS — L89622 Pressure ulcer of left heel, stage 2: Secondary | ICD-10-CM | POA: Diagnosis not present

## 2017-12-08 DIAGNOSIS — E119 Type 2 diabetes mellitus without complications: Secondary | ICD-10-CM | POA: Diagnosis not present

## 2017-12-08 DIAGNOSIS — L89612 Pressure ulcer of right heel, stage 2: Secondary | ICD-10-CM | POA: Diagnosis not present

## 2017-12-09 DIAGNOSIS — L89322 Pressure ulcer of left buttock, stage 2: Secondary | ICD-10-CM | POA: Diagnosis not present

## 2017-12-09 DIAGNOSIS — L89622 Pressure ulcer of left heel, stage 2: Secondary | ICD-10-CM | POA: Diagnosis not present

## 2017-12-09 DIAGNOSIS — L89892 Pressure ulcer of other site, stage 2: Secondary | ICD-10-CM | POA: Diagnosis not present

## 2017-12-09 DIAGNOSIS — L89890 Pressure ulcer of other site, unstageable: Secondary | ICD-10-CM | POA: Diagnosis not present

## 2017-12-09 DIAGNOSIS — L89612 Pressure ulcer of right heel, stage 2: Secondary | ICD-10-CM | POA: Diagnosis not present

## 2017-12-09 DIAGNOSIS — E119 Type 2 diabetes mellitus without complications: Secondary | ICD-10-CM | POA: Diagnosis not present

## 2017-12-10 DIAGNOSIS — E119 Type 2 diabetes mellitus without complications: Secondary | ICD-10-CM | POA: Diagnosis not present

## 2017-12-10 DIAGNOSIS — L89322 Pressure ulcer of left buttock, stage 2: Secondary | ICD-10-CM | POA: Diagnosis not present

## 2017-12-10 DIAGNOSIS — L89622 Pressure ulcer of left heel, stage 2: Secondary | ICD-10-CM | POA: Diagnosis not present

## 2017-12-10 DIAGNOSIS — L89612 Pressure ulcer of right heel, stage 2: Secondary | ICD-10-CM | POA: Diagnosis not present

## 2017-12-10 DIAGNOSIS — L89892 Pressure ulcer of other site, stage 2: Secondary | ICD-10-CM | POA: Diagnosis not present

## 2017-12-10 DIAGNOSIS — L89890 Pressure ulcer of other site, unstageable: Secondary | ICD-10-CM | POA: Diagnosis not present

## 2017-12-14 DIAGNOSIS — L89622 Pressure ulcer of left heel, stage 2: Secondary | ICD-10-CM | POA: Diagnosis not present

## 2017-12-14 DIAGNOSIS — L89322 Pressure ulcer of left buttock, stage 2: Secondary | ICD-10-CM | POA: Diagnosis not present

## 2017-12-14 DIAGNOSIS — L89890 Pressure ulcer of other site, unstageable: Secondary | ICD-10-CM | POA: Diagnosis not present

## 2017-12-14 DIAGNOSIS — E119 Type 2 diabetes mellitus without complications: Secondary | ICD-10-CM | POA: Diagnosis not present

## 2017-12-14 DIAGNOSIS — L89612 Pressure ulcer of right heel, stage 2: Secondary | ICD-10-CM | POA: Diagnosis not present

## 2017-12-14 DIAGNOSIS — L89892 Pressure ulcer of other site, stage 2: Secondary | ICD-10-CM | POA: Diagnosis not present

## 2017-12-16 DIAGNOSIS — L89892 Pressure ulcer of other site, stage 2: Secondary | ICD-10-CM | POA: Diagnosis not present

## 2017-12-16 DIAGNOSIS — L89322 Pressure ulcer of left buttock, stage 2: Secondary | ICD-10-CM | POA: Diagnosis not present

## 2017-12-16 DIAGNOSIS — E119 Type 2 diabetes mellitus without complications: Secondary | ICD-10-CM | POA: Diagnosis not present

## 2017-12-16 DIAGNOSIS — L89890 Pressure ulcer of other site, unstageable: Secondary | ICD-10-CM | POA: Diagnosis not present

## 2017-12-16 DIAGNOSIS — L89622 Pressure ulcer of left heel, stage 2: Secondary | ICD-10-CM | POA: Diagnosis not present

## 2017-12-16 DIAGNOSIS — L89612 Pressure ulcer of right heel, stage 2: Secondary | ICD-10-CM | POA: Diagnosis not present

## 2017-12-17 ENCOUNTER — Other Ambulatory Visit: Payer: Self-pay | Admitting: Internal Medicine

## 2017-12-17 NOTE — Telephone Encounter (Signed)
Patient needs to contact his PCP for refill needs.

## 2017-12-17 NOTE — Telephone Encounter (Signed)
Patient needs to contact his PCP for refills.

## 2017-12-18 DIAGNOSIS — L89612 Pressure ulcer of right heel, stage 2: Secondary | ICD-10-CM | POA: Diagnosis not present

## 2017-12-18 DIAGNOSIS — L89890 Pressure ulcer of other site, unstageable: Secondary | ICD-10-CM | POA: Diagnosis not present

## 2017-12-18 DIAGNOSIS — L89622 Pressure ulcer of left heel, stage 2: Secondary | ICD-10-CM | POA: Diagnosis not present

## 2017-12-18 DIAGNOSIS — E119 Type 2 diabetes mellitus without complications: Secondary | ICD-10-CM | POA: Diagnosis not present

## 2017-12-18 DIAGNOSIS — L89892 Pressure ulcer of other site, stage 2: Secondary | ICD-10-CM | POA: Diagnosis not present

## 2017-12-18 DIAGNOSIS — L89322 Pressure ulcer of left buttock, stage 2: Secondary | ICD-10-CM | POA: Diagnosis not present

## 2017-12-21 DIAGNOSIS — L89890 Pressure ulcer of other site, unstageable: Secondary | ICD-10-CM | POA: Diagnosis not present

## 2017-12-21 DIAGNOSIS — L89612 Pressure ulcer of right heel, stage 2: Secondary | ICD-10-CM | POA: Diagnosis not present

## 2017-12-21 DIAGNOSIS — L89622 Pressure ulcer of left heel, stage 2: Secondary | ICD-10-CM | POA: Diagnosis not present

## 2017-12-21 DIAGNOSIS — L89892 Pressure ulcer of other site, stage 2: Secondary | ICD-10-CM | POA: Diagnosis not present

## 2017-12-21 DIAGNOSIS — L89322 Pressure ulcer of left buttock, stage 2: Secondary | ICD-10-CM | POA: Diagnosis not present

## 2017-12-21 DIAGNOSIS — E119 Type 2 diabetes mellitus without complications: Secondary | ICD-10-CM | POA: Diagnosis not present

## 2017-12-23 DIAGNOSIS — L89892 Pressure ulcer of other site, stage 2: Secondary | ICD-10-CM | POA: Diagnosis not present

## 2017-12-23 DIAGNOSIS — E119 Type 2 diabetes mellitus without complications: Secondary | ICD-10-CM | POA: Diagnosis not present

## 2017-12-23 DIAGNOSIS — L89622 Pressure ulcer of left heel, stage 2: Secondary | ICD-10-CM | POA: Diagnosis not present

## 2017-12-23 DIAGNOSIS — L89322 Pressure ulcer of left buttock, stage 2: Secondary | ICD-10-CM | POA: Diagnosis not present

## 2017-12-23 DIAGNOSIS — L89890 Pressure ulcer of other site, unstageable: Secondary | ICD-10-CM | POA: Diagnosis not present

## 2017-12-23 DIAGNOSIS — L89612 Pressure ulcer of right heel, stage 2: Secondary | ICD-10-CM | POA: Diagnosis not present

## 2017-12-25 DIAGNOSIS — L89890 Pressure ulcer of other site, unstageable: Secondary | ICD-10-CM | POA: Diagnosis not present

## 2017-12-25 DIAGNOSIS — L89612 Pressure ulcer of right heel, stage 2: Secondary | ICD-10-CM | POA: Diagnosis not present

## 2017-12-25 DIAGNOSIS — L89622 Pressure ulcer of left heel, stage 2: Secondary | ICD-10-CM | POA: Diagnosis not present

## 2017-12-25 DIAGNOSIS — L89322 Pressure ulcer of left buttock, stage 2: Secondary | ICD-10-CM | POA: Diagnosis not present

## 2017-12-25 DIAGNOSIS — E119 Type 2 diabetes mellitus without complications: Secondary | ICD-10-CM | POA: Diagnosis not present

## 2017-12-25 DIAGNOSIS — L89892 Pressure ulcer of other site, stage 2: Secondary | ICD-10-CM | POA: Diagnosis not present

## 2017-12-27 DIAGNOSIS — E119 Type 2 diabetes mellitus without complications: Secondary | ICD-10-CM | POA: Diagnosis not present

## 2017-12-28 DIAGNOSIS — L89622 Pressure ulcer of left heel, stage 2: Secondary | ICD-10-CM | POA: Diagnosis not present

## 2017-12-28 DIAGNOSIS — L89890 Pressure ulcer of other site, unstageable: Secondary | ICD-10-CM | POA: Diagnosis not present

## 2017-12-28 DIAGNOSIS — L89892 Pressure ulcer of other site, stage 2: Secondary | ICD-10-CM | POA: Diagnosis not present

## 2017-12-28 DIAGNOSIS — L89322 Pressure ulcer of left buttock, stage 2: Secondary | ICD-10-CM | POA: Diagnosis not present

## 2017-12-28 DIAGNOSIS — L89612 Pressure ulcer of right heel, stage 2: Secondary | ICD-10-CM | POA: Diagnosis not present

## 2017-12-28 DIAGNOSIS — E119 Type 2 diabetes mellitus without complications: Secondary | ICD-10-CM | POA: Diagnosis not present

## 2017-12-30 DIAGNOSIS — L89322 Pressure ulcer of left buttock, stage 2: Secondary | ICD-10-CM | POA: Diagnosis not present

## 2017-12-30 DIAGNOSIS — E119 Type 2 diabetes mellitus without complications: Secondary | ICD-10-CM | POA: Diagnosis not present

## 2017-12-30 DIAGNOSIS — L89892 Pressure ulcer of other site, stage 2: Secondary | ICD-10-CM | POA: Diagnosis not present

## 2017-12-30 DIAGNOSIS — L89622 Pressure ulcer of left heel, stage 2: Secondary | ICD-10-CM | POA: Diagnosis not present

## 2017-12-30 DIAGNOSIS — L89612 Pressure ulcer of right heel, stage 2: Secondary | ICD-10-CM | POA: Diagnosis not present

## 2017-12-30 DIAGNOSIS — L89890 Pressure ulcer of other site, unstageable: Secondary | ICD-10-CM | POA: Diagnosis not present

## 2018-01-01 DIAGNOSIS — L89322 Pressure ulcer of left buttock, stage 2: Secondary | ICD-10-CM | POA: Diagnosis not present

## 2018-01-01 DIAGNOSIS — E119 Type 2 diabetes mellitus without complications: Secondary | ICD-10-CM | POA: Diagnosis not present

## 2018-01-01 DIAGNOSIS — L89622 Pressure ulcer of left heel, stage 2: Secondary | ICD-10-CM | POA: Diagnosis not present

## 2018-01-01 DIAGNOSIS — L89890 Pressure ulcer of other site, unstageable: Secondary | ICD-10-CM | POA: Diagnosis not present

## 2018-01-01 DIAGNOSIS — L89612 Pressure ulcer of right heel, stage 2: Secondary | ICD-10-CM | POA: Diagnosis not present

## 2018-01-01 DIAGNOSIS — L89892 Pressure ulcer of other site, stage 2: Secondary | ICD-10-CM | POA: Diagnosis not present

## 2018-01-03 DIAGNOSIS — T83511A Infection and inflammatory reaction due to indwelling urethral catheter, initial encounter: Secondary | ICD-10-CM | POA: Diagnosis not present

## 2018-01-03 DIAGNOSIS — R1111 Vomiting without nausea: Secondary | ICD-10-CM | POA: Diagnosis not present

## 2018-01-03 DIAGNOSIS — R404 Transient alteration of awareness: Secondary | ICD-10-CM | POA: Diagnosis not present

## 2018-01-03 DIAGNOSIS — N200 Calculus of kidney: Secondary | ICD-10-CM | POA: Diagnosis not present

## 2018-01-03 DIAGNOSIS — Y846 Urinary catheterization as the cause of abnormal reaction of the patient, or of later complication, without mention of misadventure at the time of the procedure: Secondary | ICD-10-CM | POA: Diagnosis not present

## 2018-01-03 DIAGNOSIS — K5900 Constipation, unspecified: Secondary | ICD-10-CM | POA: Diagnosis not present

## 2018-01-03 DIAGNOSIS — N390 Urinary tract infection, site not specified: Secondary | ICD-10-CM | POA: Diagnosis not present

## 2018-01-03 DIAGNOSIS — R079 Chest pain, unspecified: Secondary | ICD-10-CM | POA: Diagnosis not present

## 2018-01-05 DIAGNOSIS — L89322 Pressure ulcer of left buttock, stage 2: Secondary | ICD-10-CM | POA: Diagnosis not present

## 2018-01-05 DIAGNOSIS — R001 Bradycardia, unspecified: Secondary | ICD-10-CM | POA: Diagnosis not present

## 2018-01-05 DIAGNOSIS — I517 Cardiomegaly: Secondary | ICD-10-CM | POA: Diagnosis not present

## 2018-01-05 DIAGNOSIS — I5022 Chronic systolic (congestive) heart failure: Secondary | ICD-10-CM | POA: Diagnosis not present

## 2018-01-05 DIAGNOSIS — Z7901 Long term (current) use of anticoagulants: Secondary | ICD-10-CM | POA: Diagnosis not present

## 2018-01-05 DIAGNOSIS — L89622 Pressure ulcer of left heel, stage 2: Secondary | ICD-10-CM | POA: Diagnosis not present

## 2018-01-05 DIAGNOSIS — L8990 Pressure ulcer of unspecified site, unspecified stage: Secondary | ICD-10-CM | POA: Diagnosis not present

## 2018-01-05 DIAGNOSIS — R9431 Abnormal electrocardiogram [ECG] [EKG]: Secondary | ICD-10-CM | POA: Diagnosis not present

## 2018-01-05 DIAGNOSIS — L89892 Pressure ulcer of other site, stage 2: Secondary | ICD-10-CM | POA: Diagnosis not present

## 2018-01-05 DIAGNOSIS — I10 Essential (primary) hypertension: Secondary | ICD-10-CM | POA: Diagnosis not present

## 2018-01-05 DIAGNOSIS — L89612 Pressure ulcer of right heel, stage 2: Secondary | ICD-10-CM | POA: Diagnosis not present

## 2018-01-05 DIAGNOSIS — F341 Dysthymic disorder: Secondary | ICD-10-CM | POA: Diagnosis not present

## 2018-01-05 DIAGNOSIS — E119 Type 2 diabetes mellitus without complications: Secondary | ICD-10-CM | POA: Diagnosis not present

## 2018-01-05 DIAGNOSIS — L89890 Pressure ulcer of other site, unstageable: Secondary | ICD-10-CM | POA: Diagnosis not present

## 2018-01-08 DIAGNOSIS — E119 Type 2 diabetes mellitus without complications: Secondary | ICD-10-CM | POA: Diagnosis not present

## 2018-01-08 DIAGNOSIS — L89322 Pressure ulcer of left buttock, stage 2: Secondary | ICD-10-CM | POA: Diagnosis not present

## 2018-01-08 DIAGNOSIS — L89892 Pressure ulcer of other site, stage 2: Secondary | ICD-10-CM | POA: Diagnosis not present

## 2018-01-08 DIAGNOSIS — L89622 Pressure ulcer of left heel, stage 2: Secondary | ICD-10-CM | POA: Diagnosis not present

## 2018-01-08 DIAGNOSIS — L89890 Pressure ulcer of other site, unstageable: Secondary | ICD-10-CM | POA: Diagnosis not present

## 2018-01-08 DIAGNOSIS — L89612 Pressure ulcer of right heel, stage 2: Secondary | ICD-10-CM | POA: Diagnosis not present

## 2018-01-12 DIAGNOSIS — L89890 Pressure ulcer of other site, unstageable: Secondary | ICD-10-CM | POA: Diagnosis not present

## 2018-01-12 DIAGNOSIS — L89322 Pressure ulcer of left buttock, stage 2: Secondary | ICD-10-CM | POA: Diagnosis not present

## 2018-01-12 DIAGNOSIS — L89622 Pressure ulcer of left heel, stage 2: Secondary | ICD-10-CM | POA: Diagnosis not present

## 2018-01-12 DIAGNOSIS — L89892 Pressure ulcer of other site, stage 2: Secondary | ICD-10-CM | POA: Diagnosis not present

## 2018-01-12 DIAGNOSIS — L89612 Pressure ulcer of right heel, stage 2: Secondary | ICD-10-CM | POA: Diagnosis not present

## 2018-01-12 DIAGNOSIS — E119 Type 2 diabetes mellitus without complications: Secondary | ICD-10-CM | POA: Diagnosis not present

## 2018-01-13 DIAGNOSIS — L89622 Pressure ulcer of left heel, stage 2: Secondary | ICD-10-CM | POA: Diagnosis not present

## 2018-01-13 DIAGNOSIS — E119 Type 2 diabetes mellitus without complications: Secondary | ICD-10-CM | POA: Diagnosis not present

## 2018-01-13 DIAGNOSIS — L89890 Pressure ulcer of other site, unstageable: Secondary | ICD-10-CM | POA: Diagnosis not present

## 2018-01-13 DIAGNOSIS — L89612 Pressure ulcer of right heel, stage 2: Secondary | ICD-10-CM | POA: Diagnosis not present

## 2018-01-13 DIAGNOSIS — L89892 Pressure ulcer of other site, stage 2: Secondary | ICD-10-CM | POA: Diagnosis not present

## 2018-01-13 DIAGNOSIS — L89322 Pressure ulcer of left buttock, stage 2: Secondary | ICD-10-CM | POA: Diagnosis not present

## 2018-01-15 DIAGNOSIS — E119 Type 2 diabetes mellitus without complications: Secondary | ICD-10-CM | POA: Diagnosis not present

## 2018-01-15 DIAGNOSIS — L89890 Pressure ulcer of other site, unstageable: Secondary | ICD-10-CM | POA: Diagnosis not present

## 2018-01-15 DIAGNOSIS — L89622 Pressure ulcer of left heel, stage 2: Secondary | ICD-10-CM | POA: Diagnosis not present

## 2018-01-15 DIAGNOSIS — L89892 Pressure ulcer of other site, stage 2: Secondary | ICD-10-CM | POA: Diagnosis not present

## 2018-01-15 DIAGNOSIS — L89612 Pressure ulcer of right heel, stage 2: Secondary | ICD-10-CM | POA: Diagnosis not present

## 2018-01-15 DIAGNOSIS — L89322 Pressure ulcer of left buttock, stage 2: Secondary | ICD-10-CM | POA: Diagnosis not present

## 2018-01-16 DIAGNOSIS — Z86718 Personal history of other venous thrombosis and embolism: Secondary | ICD-10-CM | POA: Diagnosis not present

## 2018-01-16 DIAGNOSIS — L89892 Pressure ulcer of other site, stage 2: Secondary | ICD-10-CM | POA: Diagnosis not present

## 2018-01-16 DIAGNOSIS — I69391 Dysphagia following cerebral infarction: Secondary | ICD-10-CM | POA: Diagnosis not present

## 2018-01-16 DIAGNOSIS — L89890 Pressure ulcer of other site, unstageable: Secondary | ICD-10-CM | POA: Diagnosis not present

## 2018-01-16 DIAGNOSIS — I5022 Chronic systolic (congestive) heart failure: Secondary | ICD-10-CM | POA: Diagnosis not present

## 2018-01-16 DIAGNOSIS — Z7901 Long term (current) use of anticoagulants: Secondary | ICD-10-CM | POA: Diagnosis not present

## 2018-01-16 DIAGNOSIS — R1310 Dysphagia, unspecified: Secondary | ICD-10-CM | POA: Diagnosis not present

## 2018-01-16 DIAGNOSIS — E119 Type 2 diabetes mellitus without complications: Secondary | ICD-10-CM | POA: Diagnosis not present

## 2018-01-16 DIAGNOSIS — Z7401 Bed confinement status: Secondary | ICD-10-CM | POA: Diagnosis not present

## 2018-01-16 DIAGNOSIS — L89622 Pressure ulcer of left heel, stage 2: Secondary | ICD-10-CM | POA: Diagnosis not present

## 2018-01-16 DIAGNOSIS — L89322 Pressure ulcer of left buttock, stage 2: Secondary | ICD-10-CM | POA: Diagnosis not present

## 2018-01-16 DIAGNOSIS — Z431 Encounter for attention to gastrostomy: Secondary | ICD-10-CM | POA: Diagnosis not present

## 2018-01-16 DIAGNOSIS — I69351 Hemiplegia and hemiparesis following cerebral infarction affecting right dominant side: Secondary | ICD-10-CM | POA: Diagnosis not present

## 2018-01-16 DIAGNOSIS — F329 Major depressive disorder, single episode, unspecified: Secondary | ICD-10-CM | POA: Diagnosis not present

## 2018-01-16 DIAGNOSIS — G40909 Epilepsy, unspecified, not intractable, without status epilepticus: Secondary | ICD-10-CM | POA: Diagnosis not present

## 2018-01-16 DIAGNOSIS — Z87891 Personal history of nicotine dependence: Secondary | ICD-10-CM | POA: Diagnosis not present

## 2018-01-16 DIAGNOSIS — Z466 Encounter for fitting and adjustment of urinary device: Secondary | ICD-10-CM | POA: Diagnosis not present

## 2018-01-16 DIAGNOSIS — I110 Hypertensive heart disease with heart failure: Secondary | ICD-10-CM | POA: Diagnosis not present

## 2018-01-16 DIAGNOSIS — Z4800 Encounter for change or removal of nonsurgical wound dressing: Secondary | ICD-10-CM | POA: Diagnosis not present

## 2018-01-16 DIAGNOSIS — I69393 Ataxia following cerebral infarction: Secondary | ICD-10-CM | POA: Diagnosis not present

## 2018-01-16 DIAGNOSIS — L89612 Pressure ulcer of right heel, stage 2: Secondary | ICD-10-CM | POA: Diagnosis not present

## 2018-01-19 DIAGNOSIS — E119 Type 2 diabetes mellitus without complications: Secondary | ICD-10-CM | POA: Diagnosis not present

## 2018-01-19 DIAGNOSIS — L89322 Pressure ulcer of left buttock, stage 2: Secondary | ICD-10-CM | POA: Diagnosis not present

## 2018-01-19 DIAGNOSIS — L89622 Pressure ulcer of left heel, stage 2: Secondary | ICD-10-CM | POA: Diagnosis not present

## 2018-01-19 DIAGNOSIS — L89892 Pressure ulcer of other site, stage 2: Secondary | ICD-10-CM | POA: Diagnosis not present

## 2018-01-19 DIAGNOSIS — L89612 Pressure ulcer of right heel, stage 2: Secondary | ICD-10-CM | POA: Diagnosis not present

## 2018-01-19 DIAGNOSIS — L89890 Pressure ulcer of other site, unstageable: Secondary | ICD-10-CM | POA: Diagnosis not present

## 2018-01-22 DIAGNOSIS — L89322 Pressure ulcer of left buttock, stage 2: Secondary | ICD-10-CM | POA: Diagnosis not present

## 2018-01-22 DIAGNOSIS — L89892 Pressure ulcer of other site, stage 2: Secondary | ICD-10-CM | POA: Diagnosis not present

## 2018-01-22 DIAGNOSIS — L89622 Pressure ulcer of left heel, stage 2: Secondary | ICD-10-CM | POA: Diagnosis not present

## 2018-01-22 DIAGNOSIS — L89890 Pressure ulcer of other site, unstageable: Secondary | ICD-10-CM | POA: Diagnosis not present

## 2018-01-22 DIAGNOSIS — E119 Type 2 diabetes mellitus without complications: Secondary | ICD-10-CM | POA: Diagnosis not present

## 2018-01-22 DIAGNOSIS — L89612 Pressure ulcer of right heel, stage 2: Secondary | ICD-10-CM | POA: Diagnosis not present

## 2018-01-25 DIAGNOSIS — E119 Type 2 diabetes mellitus without complications: Secondary | ICD-10-CM | POA: Diagnosis not present

## 2018-01-25 DIAGNOSIS — L89322 Pressure ulcer of left buttock, stage 2: Secondary | ICD-10-CM | POA: Diagnosis not present

## 2018-01-25 DIAGNOSIS — L89612 Pressure ulcer of right heel, stage 2: Secondary | ICD-10-CM | POA: Diagnosis not present

## 2018-01-25 DIAGNOSIS — L89622 Pressure ulcer of left heel, stage 2: Secondary | ICD-10-CM | POA: Diagnosis not present

## 2018-01-25 DIAGNOSIS — L89892 Pressure ulcer of other site, stage 2: Secondary | ICD-10-CM | POA: Diagnosis not present

## 2018-01-25 DIAGNOSIS — L89890 Pressure ulcer of other site, unstageable: Secondary | ICD-10-CM | POA: Diagnosis not present

## 2018-01-27 DIAGNOSIS — I110 Hypertensive heart disease with heart failure: Secondary | ICD-10-CM | POA: Diagnosis not present

## 2018-01-27 DIAGNOSIS — I5022 Chronic systolic (congestive) heart failure: Secondary | ICD-10-CM | POA: Diagnosis not present

## 2018-01-28 DIAGNOSIS — E119 Type 2 diabetes mellitus without complications: Secondary | ICD-10-CM | POA: Diagnosis not present

## 2018-01-28 DIAGNOSIS — L89892 Pressure ulcer of other site, stage 2: Secondary | ICD-10-CM | POA: Diagnosis not present

## 2018-01-28 DIAGNOSIS — L89612 Pressure ulcer of right heel, stage 2: Secondary | ICD-10-CM | POA: Diagnosis not present

## 2018-01-28 DIAGNOSIS — L89322 Pressure ulcer of left buttock, stage 2: Secondary | ICD-10-CM | POA: Diagnosis not present

## 2018-01-28 DIAGNOSIS — L89622 Pressure ulcer of left heel, stage 2: Secondary | ICD-10-CM | POA: Diagnosis not present

## 2018-01-28 DIAGNOSIS — L89890 Pressure ulcer of other site, unstageable: Secondary | ICD-10-CM | POA: Diagnosis not present

## 2018-02-01 DIAGNOSIS — L89612 Pressure ulcer of right heel, stage 2: Secondary | ICD-10-CM | POA: Diagnosis not present

## 2018-02-01 DIAGNOSIS — L89322 Pressure ulcer of left buttock, stage 2: Secondary | ICD-10-CM | POA: Diagnosis not present

## 2018-02-01 DIAGNOSIS — E119 Type 2 diabetes mellitus without complications: Secondary | ICD-10-CM | POA: Diagnosis not present

## 2018-02-01 DIAGNOSIS — L89892 Pressure ulcer of other site, stage 2: Secondary | ICD-10-CM | POA: Diagnosis not present

## 2018-02-01 DIAGNOSIS — L89622 Pressure ulcer of left heel, stage 2: Secondary | ICD-10-CM | POA: Diagnosis not present

## 2018-02-01 DIAGNOSIS — L89890 Pressure ulcer of other site, unstageable: Secondary | ICD-10-CM | POA: Diagnosis not present

## 2018-02-03 DIAGNOSIS — I10 Essential (primary) hypertension: Secondary | ICD-10-CM | POA: Diagnosis not present

## 2018-02-03 DIAGNOSIS — L89612 Pressure ulcer of right heel, stage 2: Secondary | ICD-10-CM | POA: Diagnosis not present

## 2018-02-03 DIAGNOSIS — X58XXXA Exposure to other specified factors, initial encounter: Secondary | ICD-10-CM | POA: Diagnosis not present

## 2018-02-03 DIAGNOSIS — I82402 Acute embolism and thrombosis of unspecified deep veins of left lower extremity: Secondary | ICD-10-CM | POA: Diagnosis not present

## 2018-02-03 DIAGNOSIS — L89322 Pressure ulcer of left buttock, stage 2: Secondary | ICD-10-CM | POA: Diagnosis not present

## 2018-02-03 DIAGNOSIS — S3730XA Unspecified injury of urethra, initial encounter: Secondary | ICD-10-CM | POA: Diagnosis not present

## 2018-02-03 DIAGNOSIS — Z86718 Personal history of other venous thrombosis and embolism: Secondary | ICD-10-CM | POA: Diagnosis not present

## 2018-02-03 DIAGNOSIS — L89893 Pressure ulcer of other site, stage 3: Secondary | ICD-10-CM | POA: Diagnosis present

## 2018-02-03 DIAGNOSIS — I509 Heart failure, unspecified: Secondary | ICD-10-CM | POA: Diagnosis present

## 2018-02-03 DIAGNOSIS — Z87891 Personal history of nicotine dependence: Secondary | ICD-10-CM | POA: Diagnosis not present

## 2018-02-03 DIAGNOSIS — L89890 Pressure ulcer of other site, unstageable: Secondary | ICD-10-CM | POA: Diagnosis not present

## 2018-02-03 DIAGNOSIS — L89629 Pressure ulcer of left heel, unspecified stage: Secondary | ICD-10-CM | POA: Diagnosis not present

## 2018-02-03 DIAGNOSIS — L89892 Pressure ulcer of other site, stage 2: Secondary | ICD-10-CM | POA: Diagnosis present

## 2018-02-03 DIAGNOSIS — Z79899 Other long term (current) drug therapy: Secondary | ICD-10-CM | POA: Diagnosis not present

## 2018-02-03 DIAGNOSIS — Z7401 Bed confinement status: Secondary | ICD-10-CM | POA: Diagnosis not present

## 2018-02-03 DIAGNOSIS — L89159 Pressure ulcer of sacral region, unspecified stage: Secondary | ICD-10-CM | POA: Diagnosis not present

## 2018-02-03 DIAGNOSIS — G40909 Epilepsy, unspecified, not intractable, without status epilepticus: Secondary | ICD-10-CM | POA: Diagnosis present

## 2018-02-03 DIAGNOSIS — I081 Rheumatic disorders of both mitral and tricuspid valves: Secondary | ICD-10-CM | POA: Diagnosis not present

## 2018-02-03 DIAGNOSIS — R2243 Localized swelling, mass and lump, lower limb, bilateral: Secondary | ICD-10-CM | POA: Diagnosis not present

## 2018-02-03 DIAGNOSIS — Z743 Need for continuous supervision: Secondary | ICD-10-CM | POA: Diagnosis not present

## 2018-02-03 DIAGNOSIS — L89619 Pressure ulcer of right heel, unspecified stage: Secondary | ICD-10-CM | POA: Diagnosis not present

## 2018-02-03 DIAGNOSIS — J45909 Unspecified asthma, uncomplicated: Secondary | ICD-10-CM | POA: Diagnosis not present

## 2018-02-03 DIAGNOSIS — T83498A Other mechanical complication of other prosthetic devices, implants and grafts of genital tract, initial encounter: Secondary | ICD-10-CM | POA: Diagnosis not present

## 2018-02-03 DIAGNOSIS — R319 Hematuria, unspecified: Secondary | ICD-10-CM | POA: Diagnosis not present

## 2018-02-03 DIAGNOSIS — Z7901 Long term (current) use of anticoagulants: Secondary | ICD-10-CM | POA: Diagnosis not present

## 2018-02-03 DIAGNOSIS — H109 Unspecified conjunctivitis: Secondary | ICD-10-CM | POA: Diagnosis not present

## 2018-02-03 DIAGNOSIS — N35919 Unspecified urethral stricture, male, unspecified site: Secondary | ICD-10-CM | POA: Diagnosis not present

## 2018-02-03 DIAGNOSIS — R29898 Other symptoms and signs involving the musculoskeletal system: Secondary | ICD-10-CM | POA: Diagnosis not present

## 2018-02-03 DIAGNOSIS — L89622 Pressure ulcer of left heel, stage 2: Secondary | ICD-10-CM | POA: Diagnosis not present

## 2018-02-03 DIAGNOSIS — S3739XA Other injury of urethra, initial encounter: Secondary | ICD-10-CM | POA: Diagnosis not present

## 2018-02-03 DIAGNOSIS — L89613 Pressure ulcer of right heel, stage 3: Secondary | ICD-10-CM | POA: Diagnosis present

## 2018-02-03 DIAGNOSIS — F419 Anxiety disorder, unspecified: Secondary | ICD-10-CM | POA: Diagnosis present

## 2018-02-03 DIAGNOSIS — B308 Other viral conjunctivitis: Secondary | ICD-10-CM | POA: Diagnosis present

## 2018-02-03 DIAGNOSIS — L89323 Pressure ulcer of left buttock, stage 3: Secondary | ICD-10-CM | POA: Diagnosis present

## 2018-02-03 DIAGNOSIS — I2510 Atherosclerotic heart disease of native coronary artery without angina pectoris: Secondary | ICD-10-CM | POA: Diagnosis present

## 2018-02-03 DIAGNOSIS — N319 Neuromuscular dysfunction of bladder, unspecified: Secondary | ICD-10-CM | POA: Diagnosis present

## 2018-02-03 DIAGNOSIS — Z960 Presence of urogenital implants: Secondary | ICD-10-CM | POA: Diagnosis not present

## 2018-02-03 DIAGNOSIS — N35011 Post-traumatic bulbous urethral stricture: Secondary | ICD-10-CM | POA: Diagnosis not present

## 2018-02-03 DIAGNOSIS — R5381 Other malaise: Secondary | ICD-10-CM | POA: Diagnosis not present

## 2018-02-03 DIAGNOSIS — L89623 Pressure ulcer of left heel, stage 3: Secondary | ICD-10-CM | POA: Diagnosis not present

## 2018-02-03 DIAGNOSIS — R410 Disorientation, unspecified: Secondary | ICD-10-CM | POA: Diagnosis not present

## 2018-02-03 DIAGNOSIS — F329 Major depressive disorder, single episode, unspecified: Secondary | ICD-10-CM | POA: Diagnosis present

## 2018-02-03 DIAGNOSIS — M2550 Pain in unspecified joint: Secondary | ICD-10-CM | POA: Diagnosis not present

## 2018-02-03 DIAGNOSIS — R339 Retention of urine, unspecified: Secondary | ICD-10-CM | POA: Diagnosis not present

## 2018-02-03 DIAGNOSIS — R310 Gross hematuria: Secondary | ICD-10-CM | POA: Diagnosis not present

## 2018-02-03 DIAGNOSIS — I110 Hypertensive heart disease with heart failure: Secondary | ICD-10-CM | POA: Diagnosis present

## 2018-02-03 DIAGNOSIS — T83198A Other mechanical complication of other urinary devices and implants, initial encounter: Secondary | ICD-10-CM | POA: Diagnosis not present

## 2018-02-03 DIAGNOSIS — Z951 Presence of aortocoronary bypass graft: Secondary | ICD-10-CM | POA: Diagnosis not present

## 2018-02-03 DIAGNOSIS — E440 Moderate protein-calorie malnutrition: Secondary | ICD-10-CM | POA: Diagnosis present

## 2018-02-03 DIAGNOSIS — E119 Type 2 diabetes mellitus without complications: Secondary | ICD-10-CM | POA: Diagnosis present

## 2018-02-03 DIAGNOSIS — Z8673 Personal history of transient ischemic attack (TIA), and cerebral infarction without residual deficits: Secondary | ICD-10-CM | POA: Diagnosis not present

## 2018-02-03 DIAGNOSIS — N99111 Postprocedural bulbous urethral stricture: Secondary | ICD-10-CM | POA: Diagnosis not present

## 2018-02-03 DIAGNOSIS — Y999 Unspecified external cause status: Secondary | ICD-10-CM | POA: Diagnosis not present

## 2018-02-03 DIAGNOSIS — G8250 Quadriplegia, unspecified: Secondary | ICD-10-CM | POA: Diagnosis present

## 2018-02-16 DIAGNOSIS — L8990 Pressure ulcer of unspecified site, unspecified stage: Secondary | ICD-10-CM | POA: Diagnosis not present

## 2018-02-16 DIAGNOSIS — I5022 Chronic systolic (congestive) heart failure: Secondary | ICD-10-CM | POA: Diagnosis not present

## 2018-02-16 DIAGNOSIS — I10 Essential (primary) hypertension: Secondary | ICD-10-CM | POA: Diagnosis not present

## 2018-02-16 DIAGNOSIS — Z931 Gastrostomy status: Secondary | ICD-10-CM | POA: Diagnosis not present

## 2018-02-16 DIAGNOSIS — E119 Type 2 diabetes mellitus without complications: Secondary | ICD-10-CM | POA: Diagnosis not present

## 2018-02-16 DIAGNOSIS — Z7901 Long term (current) use of anticoagulants: Secondary | ICD-10-CM | POA: Diagnosis not present

## 2018-02-24 DIAGNOSIS — Z8673 Personal history of transient ischemic attack (TIA), and cerebral infarction without residual deficits: Secondary | ICD-10-CM | POA: Diagnosis not present

## 2018-02-24 DIAGNOSIS — Z931 Gastrostomy status: Secondary | ICD-10-CM | POA: Diagnosis not present

## 2018-02-24 DIAGNOSIS — Z86718 Personal history of other venous thrombosis and embolism: Secondary | ICD-10-CM | POA: Diagnosis not present

## 2018-02-24 DIAGNOSIS — L89612 Pressure ulcer of right heel, stage 2: Secondary | ICD-10-CM | POA: Diagnosis not present

## 2018-02-24 DIAGNOSIS — L97311 Non-pressure chronic ulcer of right ankle limited to breakdown of skin: Secondary | ICD-10-CM | POA: Diagnosis not present

## 2018-02-24 DIAGNOSIS — L89892 Pressure ulcer of other site, stage 2: Secondary | ICD-10-CM | POA: Diagnosis not present

## 2018-02-24 DIAGNOSIS — E119 Type 2 diabetes mellitus without complications: Secondary | ICD-10-CM | POA: Diagnosis not present

## 2018-02-24 DIAGNOSIS — I2510 Atherosclerotic heart disease of native coronary artery without angina pectoris: Secondary | ICD-10-CM | POA: Diagnosis not present

## 2018-02-24 DIAGNOSIS — L97221 Non-pressure chronic ulcer of left calf limited to breakdown of skin: Secondary | ICD-10-CM | POA: Diagnosis not present

## 2018-02-24 DIAGNOSIS — I110 Hypertensive heart disease with heart failure: Secondary | ICD-10-CM | POA: Diagnosis not present

## 2018-02-24 DIAGNOSIS — L89620 Pressure ulcer of left heel, unstageable: Secondary | ICD-10-CM | POA: Diagnosis not present

## 2018-02-24 DIAGNOSIS — F329 Major depressive disorder, single episode, unspecified: Secondary | ICD-10-CM | POA: Diagnosis not present

## 2018-02-24 DIAGNOSIS — I878 Other specified disorders of veins: Secondary | ICD-10-CM | POA: Diagnosis not present

## 2018-02-24 DIAGNOSIS — Z7901 Long term (current) use of anticoagulants: Secondary | ICD-10-CM | POA: Diagnosis not present

## 2018-02-24 DIAGNOSIS — Z466 Encounter for fitting and adjustment of urinary device: Secondary | ICD-10-CM | POA: Diagnosis not present

## 2018-02-24 DIAGNOSIS — R1310 Dysphagia, unspecified: Secondary | ICD-10-CM | POA: Diagnosis not present

## 2018-02-24 DIAGNOSIS — Z87891 Personal history of nicotine dependence: Secondary | ICD-10-CM | POA: Diagnosis not present

## 2018-02-24 DIAGNOSIS — E7800 Pure hypercholesterolemia, unspecified: Secondary | ICD-10-CM | POA: Diagnosis not present

## 2018-02-24 DIAGNOSIS — I5022 Chronic systolic (congestive) heart failure: Secondary | ICD-10-CM | POA: Diagnosis not present

## 2018-02-24 DIAGNOSIS — L97321 Non-pressure chronic ulcer of left ankle limited to breakdown of skin: Secondary | ICD-10-CM | POA: Diagnosis not present

## 2018-02-26 DIAGNOSIS — I110 Hypertensive heart disease with heart failure: Secondary | ICD-10-CM | POA: Diagnosis not present

## 2018-03-02 DIAGNOSIS — L89620 Pressure ulcer of left heel, unstageable: Secondary | ICD-10-CM | POA: Diagnosis not present

## 2018-03-02 DIAGNOSIS — I110 Hypertensive heart disease with heart failure: Secondary | ICD-10-CM | POA: Diagnosis not present

## 2018-03-02 DIAGNOSIS — L89892 Pressure ulcer of other site, stage 2: Secondary | ICD-10-CM | POA: Diagnosis not present

## 2018-03-02 DIAGNOSIS — Z466 Encounter for fitting and adjustment of urinary device: Secondary | ICD-10-CM | POA: Diagnosis not present

## 2018-03-02 DIAGNOSIS — I2510 Atherosclerotic heart disease of native coronary artery without angina pectoris: Secondary | ICD-10-CM | POA: Diagnosis not present

## 2018-03-02 DIAGNOSIS — L89612 Pressure ulcer of right heel, stage 2: Secondary | ICD-10-CM | POA: Diagnosis not present

## 2018-03-04 DIAGNOSIS — N99112 Postprocedural membranous urethral stricture: Secondary | ICD-10-CM | POA: Diagnosis not present

## 2018-03-04 DIAGNOSIS — I2510 Atherosclerotic heart disease of native coronary artery without angina pectoris: Secondary | ICD-10-CM | POA: Diagnosis not present

## 2018-03-04 DIAGNOSIS — I110 Hypertensive heart disease with heart failure: Secondary | ICD-10-CM | POA: Diagnosis not present

## 2018-03-04 DIAGNOSIS — L89612 Pressure ulcer of right heel, stage 2: Secondary | ICD-10-CM | POA: Diagnosis not present

## 2018-03-04 DIAGNOSIS — Z466 Encounter for fitting and adjustment of urinary device: Secondary | ICD-10-CM | POA: Diagnosis not present

## 2018-03-04 DIAGNOSIS — L89620 Pressure ulcer of left heel, unstageable: Secondary | ICD-10-CM | POA: Diagnosis not present

## 2018-03-04 DIAGNOSIS — L89892 Pressure ulcer of other site, stage 2: Secondary | ICD-10-CM | POA: Diagnosis not present

## 2018-03-04 DIAGNOSIS — R339 Retention of urine, unspecified: Secondary | ICD-10-CM | POA: Diagnosis not present

## 2018-03-05 DIAGNOSIS — Z466 Encounter for fitting and adjustment of urinary device: Secondary | ICD-10-CM | POA: Diagnosis not present

## 2018-03-05 DIAGNOSIS — L89620 Pressure ulcer of left heel, unstageable: Secondary | ICD-10-CM | POA: Diagnosis not present

## 2018-03-05 DIAGNOSIS — I110 Hypertensive heart disease with heart failure: Secondary | ICD-10-CM | POA: Diagnosis not present

## 2018-03-05 DIAGNOSIS — I2510 Atherosclerotic heart disease of native coronary artery without angina pectoris: Secondary | ICD-10-CM | POA: Diagnosis not present

## 2018-03-05 DIAGNOSIS — L89612 Pressure ulcer of right heel, stage 2: Secondary | ICD-10-CM | POA: Diagnosis not present

## 2018-03-05 DIAGNOSIS — L89892 Pressure ulcer of other site, stage 2: Secondary | ICD-10-CM | POA: Diagnosis not present

## 2018-03-08 DIAGNOSIS — N319 Neuromuscular dysfunction of bladder, unspecified: Secondary | ICD-10-CM | POA: Diagnosis present

## 2018-03-08 DIAGNOSIS — F419 Anxiety disorder, unspecified: Secondary | ICD-10-CM | POA: Diagnosis present

## 2018-03-08 DIAGNOSIS — Z466 Encounter for fitting and adjustment of urinary device: Secondary | ICD-10-CM | POA: Diagnosis not present

## 2018-03-08 DIAGNOSIS — I10 Essential (primary) hypertension: Secondary | ICD-10-CM | POA: Diagnosis not present

## 2018-03-08 DIAGNOSIS — I69369 Other paralytic syndrome following cerebral infarction affecting unspecified side: Secondary | ICD-10-CM | POA: Diagnosis not present

## 2018-03-08 DIAGNOSIS — N35919 Unspecified urethral stricture, male, unspecified site: Secondary | ICD-10-CM | POA: Diagnosis not present

## 2018-03-08 DIAGNOSIS — L89893 Pressure ulcer of other site, stage 3: Secondary | ICD-10-CM | POA: Diagnosis present

## 2018-03-08 DIAGNOSIS — Z951 Presence of aortocoronary bypass graft: Secondary | ICD-10-CM | POA: Diagnosis not present

## 2018-03-08 DIAGNOSIS — R404 Transient alteration of awareness: Secondary | ICD-10-CM | POA: Diagnosis not present

## 2018-03-08 DIAGNOSIS — Z515 Encounter for palliative care: Secondary | ICD-10-CM | POA: Diagnosis not present

## 2018-03-08 DIAGNOSIS — I130 Hypertensive heart and chronic kidney disease with heart failure and stage 1 through stage 4 chronic kidney disease, or unspecified chronic kidney disease: Secondary | ICD-10-CM | POA: Diagnosis present

## 2018-03-08 DIAGNOSIS — I472 Ventricular tachycardia: Secondary | ICD-10-CM | POA: Diagnosis not present

## 2018-03-08 DIAGNOSIS — R279 Unspecified lack of coordination: Secondary | ICD-10-CM | POA: Diagnosis not present

## 2018-03-08 DIAGNOSIS — A411 Sepsis due to other specified staphylococcus: Secondary | ICD-10-CM | POA: Diagnosis present

## 2018-03-08 DIAGNOSIS — R7881 Bacteremia: Secondary | ICD-10-CM | POA: Diagnosis not present

## 2018-03-08 DIAGNOSIS — I82402 Acute embolism and thrombosis of unspecified deep veins of left lower extremity: Secondary | ICD-10-CM | POA: Diagnosis not present

## 2018-03-08 DIAGNOSIS — R000 Tachycardia, unspecified: Secondary | ICD-10-CM | POA: Diagnosis not present

## 2018-03-08 DIAGNOSIS — Z87440 Personal history of urinary (tract) infections: Secondary | ICD-10-CM | POA: Diagnosis not present

## 2018-03-08 DIAGNOSIS — R5381 Other malaise: Secondary | ICD-10-CM | POA: Diagnosis not present

## 2018-03-08 DIAGNOSIS — Z960 Presence of urogenital implants: Secondary | ICD-10-CM | POA: Diagnosis not present

## 2018-03-08 DIAGNOSIS — T83511A Infection and inflammatory reaction due to indwelling urethral catheter, initial encounter: Secondary | ICD-10-CM | POA: Diagnosis not present

## 2018-03-08 DIAGNOSIS — L89623 Pressure ulcer of left heel, stage 3: Secondary | ICD-10-CM | POA: Diagnosis not present

## 2018-03-08 DIAGNOSIS — I110 Hypertensive heart disease with heart failure: Secondary | ICD-10-CM | POA: Diagnosis not present

## 2018-03-08 DIAGNOSIS — R9431 Abnormal electrocardiogram [ECG] [EKG]: Secondary | ICD-10-CM | POA: Diagnosis not present

## 2018-03-08 DIAGNOSIS — R509 Fever, unspecified: Secondary | ICD-10-CM | POA: Diagnosis not present

## 2018-03-08 DIAGNOSIS — Z452 Encounter for adjustment and management of vascular access device: Secondary | ICD-10-CM | POA: Diagnosis not present

## 2018-03-08 DIAGNOSIS — I447 Left bundle-branch block, unspecified: Secondary | ICD-10-CM | POA: Diagnosis not present

## 2018-03-08 DIAGNOSIS — Z743 Need for continuous supervision: Secondary | ICD-10-CM | POA: Diagnosis not present

## 2018-03-08 DIAGNOSIS — A419 Sepsis, unspecified organism: Secondary | ICD-10-CM | POA: Diagnosis not present

## 2018-03-08 DIAGNOSIS — N189 Chronic kidney disease, unspecified: Secondary | ICD-10-CM | POA: Diagnosis present

## 2018-03-08 DIAGNOSIS — L89612 Pressure ulcer of right heel, stage 2: Secondary | ICD-10-CM | POA: Diagnosis not present

## 2018-03-08 DIAGNOSIS — I5032 Chronic diastolic (congestive) heart failure: Secondary | ICD-10-CM | POA: Diagnosis present

## 2018-03-08 DIAGNOSIS — G459 Transient cerebral ischemic attack, unspecified: Secondary | ICD-10-CM | POA: Diagnosis not present

## 2018-03-08 DIAGNOSIS — R1319 Other dysphagia: Secondary | ICD-10-CM | POA: Diagnosis present

## 2018-03-08 DIAGNOSIS — Z931 Gastrostomy status: Secondary | ICD-10-CM | POA: Diagnosis not present

## 2018-03-08 DIAGNOSIS — G8250 Quadriplegia, unspecified: Secondary | ICD-10-CM | POA: Diagnosis present

## 2018-03-08 DIAGNOSIS — L89899 Pressure ulcer of other site, unspecified stage: Secondary | ICD-10-CM | POA: Diagnosis not present

## 2018-03-08 DIAGNOSIS — D72819 Decreased white blood cell count, unspecified: Secondary | ICD-10-CM | POA: Diagnosis not present

## 2018-03-08 DIAGNOSIS — L89890 Pressure ulcer of other site, unstageable: Secondary | ICD-10-CM | POA: Diagnosis not present

## 2018-03-08 DIAGNOSIS — I69391 Dysphagia following cerebral infarction: Secondary | ICD-10-CM | POA: Diagnosis not present

## 2018-03-08 DIAGNOSIS — I517 Cardiomegaly: Secondary | ICD-10-CM | POA: Diagnosis not present

## 2018-03-08 DIAGNOSIS — F329 Major depressive disorder, single episode, unspecified: Secondary | ICD-10-CM | POA: Diagnosis present

## 2018-03-08 DIAGNOSIS — Y846 Urinary catheterization as the cause of abnormal reaction of the patient, or of later complication, without mention of misadventure at the time of the procedure: Secondary | ICD-10-CM | POA: Diagnosis not present

## 2018-03-08 DIAGNOSIS — G40909 Epilepsy, unspecified, not intractable, without status epilepticus: Secondary | ICD-10-CM | POA: Diagnosis present

## 2018-03-08 DIAGNOSIS — L89620 Pressure ulcer of left heel, unstageable: Secondary | ICD-10-CM | POA: Diagnosis not present

## 2018-03-08 DIAGNOSIS — N390 Urinary tract infection, site not specified: Secondary | ICD-10-CM | POA: Diagnosis not present

## 2018-03-08 DIAGNOSIS — R0902 Hypoxemia: Secondary | ICD-10-CM | POA: Diagnosis not present

## 2018-03-08 DIAGNOSIS — L89894 Pressure ulcer of other site, stage 4: Secondary | ICD-10-CM | POA: Diagnosis present

## 2018-03-08 DIAGNOSIS — L89892 Pressure ulcer of other site, stage 2: Secondary | ICD-10-CM | POA: Diagnosis not present

## 2018-03-08 DIAGNOSIS — E1122 Type 2 diabetes mellitus with diabetic chronic kidney disease: Secondary | ICD-10-CM | POA: Diagnosis present

## 2018-03-08 DIAGNOSIS — E440 Moderate protein-calorie malnutrition: Secondary | ICD-10-CM | POA: Diagnosis present

## 2018-03-08 DIAGNOSIS — I2510 Atherosclerotic heart disease of native coronary artery without angina pectoris: Secondary | ICD-10-CM | POA: Diagnosis present

## 2018-03-08 DIAGNOSIS — D649 Anemia, unspecified: Secondary | ICD-10-CM | POA: Diagnosis not present

## 2018-04-22 DIAGNOSIS — R339 Retention of urine, unspecified: Secondary | ICD-10-CM | POA: Diagnosis not present

## 2018-04-22 DIAGNOSIS — Z466 Encounter for fitting and adjustment of urinary device: Secondary | ICD-10-CM | POA: Diagnosis not present

## 2018-05-01 DIAGNOSIS — Z931 Gastrostomy status: Secondary | ICD-10-CM | POA: Diagnosis not present

## 2018-05-01 DIAGNOSIS — L600 Ingrowing nail: Secondary | ICD-10-CM | POA: Diagnosis not present

## 2018-05-01 DIAGNOSIS — Z7901 Long term (current) use of anticoagulants: Secondary | ICD-10-CM | POA: Diagnosis not present

## 2018-05-01 DIAGNOSIS — I10 Essential (primary) hypertension: Secondary | ICD-10-CM | POA: Diagnosis not present

## 2018-05-01 DIAGNOSIS — L8990 Pressure ulcer of unspecified site, unspecified stage: Secondary | ICD-10-CM | POA: Diagnosis not present

## 2018-05-03 DIAGNOSIS — I110 Hypertensive heart disease with heart failure: Secondary | ICD-10-CM | POA: Diagnosis not present

## 2018-05-12 DIAGNOSIS — N390 Urinary tract infection, site not specified: Secondary | ICD-10-CM | POA: Diagnosis not present

## 2018-05-12 DIAGNOSIS — Z7401 Bed confinement status: Secondary | ICD-10-CM | POA: Diagnosis not present

## 2018-05-12 DIAGNOSIS — R531 Weakness: Secondary | ICD-10-CM | POA: Diagnosis not present

## 2018-05-12 DIAGNOSIS — I10 Essential (primary) hypertension: Secondary | ICD-10-CM | POA: Diagnosis not present

## 2018-05-12 DIAGNOSIS — M2550 Pain in unspecified joint: Secondary | ICD-10-CM | POA: Diagnosis not present

## 2018-05-21 DIAGNOSIS — R338 Other retention of urine: Secondary | ICD-10-CM | POA: Diagnosis not present

## 2018-06-03 DIAGNOSIS — L89612 Pressure ulcer of right heel, stage 2: Secondary | ICD-10-CM | POA: Diagnosis not present

## 2018-06-08 DIAGNOSIS — I5022 Chronic systolic (congestive) heart failure: Secondary | ICD-10-CM | POA: Diagnosis not present

## 2018-06-08 DIAGNOSIS — E119 Type 2 diabetes mellitus without complications: Secondary | ICD-10-CM | POA: Diagnosis not present

## 2018-06-08 DIAGNOSIS — I10 Essential (primary) hypertension: Secondary | ICD-10-CM | POA: Diagnosis not present

## 2018-06-08 DIAGNOSIS — L8990 Pressure ulcer of unspecified site, unspecified stage: Secondary | ICD-10-CM | POA: Diagnosis not present

## 2018-06-09 DIAGNOSIS — L02611 Cutaneous abscess of right foot: Secondary | ICD-10-CM | POA: Diagnosis not present

## 2018-06-09 DIAGNOSIS — M2041 Other hammer toe(s) (acquired), right foot: Secondary | ICD-10-CM | POA: Diagnosis not present

## 2018-06-09 DIAGNOSIS — E0840 Diabetes mellitus due to underlying condition with diabetic neuropathy, unspecified: Secondary | ICD-10-CM | POA: Diagnosis not present

## 2018-06-09 DIAGNOSIS — L84 Corns and callosities: Secondary | ICD-10-CM | POA: Diagnosis not present

## 2018-06-09 DIAGNOSIS — L03031 Cellulitis of right toe: Secondary | ICD-10-CM | POA: Diagnosis not present

## 2018-06-29 ENCOUNTER — Other Ambulatory Visit: Payer: Self-pay | Admitting: Internal Medicine

## 2018-07-07 DIAGNOSIS — I10 Essential (primary) hypertension: Secondary | ICD-10-CM | POA: Diagnosis not present

## 2018-07-07 DIAGNOSIS — Z931 Gastrostomy status: Secondary | ICD-10-CM | POA: Diagnosis not present

## 2018-07-07 DIAGNOSIS — Z7901 Long term (current) use of anticoagulants: Secondary | ICD-10-CM | POA: Diagnosis not present

## 2018-07-07 DIAGNOSIS — E119 Type 2 diabetes mellitus without complications: Secondary | ICD-10-CM | POA: Diagnosis not present

## 2018-07-07 DIAGNOSIS — L8990 Pressure ulcer of unspecified site, unspecified stage: Secondary | ICD-10-CM | POA: Diagnosis not present

## 2018-07-07 DIAGNOSIS — K5900 Constipation, unspecified: Secondary | ICD-10-CM | POA: Diagnosis not present

## 2018-07-07 DIAGNOSIS — I5022 Chronic systolic (congestive) heart failure: Secondary | ICD-10-CM | POA: Diagnosis not present

## 2018-07-10 DIAGNOSIS — T83518A Infection and inflammatory reaction due to other urinary catheter, initial encounter: Secondary | ICD-10-CM | POA: Diagnosis not present

## 2018-07-10 DIAGNOSIS — G40509 Epileptic seizures related to external causes, not intractable, without status epilepticus: Secondary | ICD-10-CM | POA: Diagnosis not present

## 2018-07-10 DIAGNOSIS — N3090 Cystitis, unspecified without hematuria: Secondary | ICD-10-CM | POA: Diagnosis present

## 2018-07-10 DIAGNOSIS — L8990 Pressure ulcer of unspecified site, unspecified stage: Secondary | ICD-10-CM | POA: Diagnosis not present

## 2018-07-10 DIAGNOSIS — D72829 Elevated white blood cell count, unspecified: Secondary | ICD-10-CM | POA: Diagnosis not present

## 2018-07-10 DIAGNOSIS — R0689 Other abnormalities of breathing: Secondary | ICD-10-CM | POA: Diagnosis not present

## 2018-07-10 DIAGNOSIS — R319 Hematuria, unspecified: Secondary | ICD-10-CM | POA: Diagnosis not present

## 2018-07-10 DIAGNOSIS — G459 Transient cerebral ischemic attack, unspecified: Secondary | ICD-10-CM | POA: Diagnosis not present

## 2018-07-10 DIAGNOSIS — Z7901 Long term (current) use of anticoagulants: Secondary | ICD-10-CM | POA: Diagnosis not present

## 2018-07-10 DIAGNOSIS — M2550 Pain in unspecified joint: Secondary | ICD-10-CM | POA: Diagnosis not present

## 2018-07-10 DIAGNOSIS — I130 Hypertensive heart and chronic kidney disease with heart failure and stage 1 through stage 4 chronic kidney disease, or unspecified chronic kidney disease: Secondary | ICD-10-CM | POA: Diagnosis present

## 2018-07-10 DIAGNOSIS — M6250 Muscle wasting and atrophy, not elsewhere classified, unspecified site: Secondary | ICD-10-CM | POA: Diagnosis not present

## 2018-07-10 DIAGNOSIS — R61 Generalized hyperhidrosis: Secondary | ICD-10-CM | POA: Diagnosis not present

## 2018-07-10 DIAGNOSIS — B9629 Other Escherichia coli [E. coli] as the cause of diseases classified elsewhere: Secondary | ICD-10-CM | POA: Diagnosis not present

## 2018-07-10 DIAGNOSIS — I509 Heart failure, unspecified: Secondary | ICD-10-CM | POA: Diagnosis not present

## 2018-07-10 DIAGNOSIS — Z7401 Bed confinement status: Secondary | ICD-10-CM | POA: Diagnosis not present

## 2018-07-10 DIAGNOSIS — I2510 Atherosclerotic heart disease of native coronary artery without angina pectoris: Secondary | ICD-10-CM | POA: Diagnosis not present

## 2018-07-10 DIAGNOSIS — I110 Hypertensive heart disease with heart failure: Secondary | ICD-10-CM | POA: Diagnosis not present

## 2018-07-10 DIAGNOSIS — N3000 Acute cystitis without hematuria: Secondary | ICD-10-CM | POA: Diagnosis not present

## 2018-07-10 DIAGNOSIS — T83511A Infection and inflammatory reaction due to indwelling urethral catheter, initial encounter: Secondary | ICD-10-CM | POA: Diagnosis not present

## 2018-07-10 DIAGNOSIS — Z681 Body mass index (BMI) 19 or less, adult: Secondary | ICD-10-CM | POA: Diagnosis not present

## 2018-07-10 DIAGNOSIS — N2889 Other specified disorders of kidney and ureter: Secondary | ICD-10-CM | POA: Diagnosis not present

## 2018-07-10 DIAGNOSIS — G8250 Quadriplegia, unspecified: Secondary | ICD-10-CM | POA: Diagnosis not present

## 2018-07-10 DIAGNOSIS — I82401 Acute embolism and thrombosis of unspecified deep veins of right lower extremity: Secondary | ICD-10-CM | POA: Diagnosis not present

## 2018-07-10 DIAGNOSIS — Z794 Long term (current) use of insulin: Secondary | ICD-10-CM | POA: Diagnosis not present

## 2018-07-10 DIAGNOSIS — J45909 Unspecified asthma, uncomplicated: Secondary | ICD-10-CM | POA: Diagnosis not present

## 2018-07-10 DIAGNOSIS — M6281 Muscle weakness (generalized): Secondary | ICD-10-CM | POA: Diagnosis not present

## 2018-07-10 DIAGNOSIS — E440 Moderate protein-calorie malnutrition: Secondary | ICD-10-CM | POA: Diagnosis not present

## 2018-07-10 DIAGNOSIS — I472 Ventricular tachycardia: Secondary | ICD-10-CM | POA: Diagnosis not present

## 2018-07-10 DIAGNOSIS — R404 Transient alteration of awareness: Secondary | ICD-10-CM | POA: Diagnosis not present

## 2018-07-10 DIAGNOSIS — B964 Proteus (mirabilis) (morganii) as the cause of diseases classified elsewhere: Secondary | ICD-10-CM | POA: Diagnosis not present

## 2018-07-10 DIAGNOSIS — L89523 Pressure ulcer of left ankle, stage 3: Secondary | ICD-10-CM | POA: Diagnosis not present

## 2018-07-10 DIAGNOSIS — Z1612 Extended spectrum beta lactamase (ESBL) resistance: Secondary | ICD-10-CM | POA: Diagnosis not present

## 2018-07-10 DIAGNOSIS — F329 Major depressive disorder, single episode, unspecified: Secondary | ICD-10-CM | POA: Diagnosis not present

## 2018-07-10 DIAGNOSIS — R41841 Cognitive communication deficit: Secondary | ICD-10-CM | POA: Diagnosis not present

## 2018-07-10 DIAGNOSIS — N189 Chronic kidney disease, unspecified: Secondary | ICD-10-CM | POA: Diagnosis present

## 2018-07-10 DIAGNOSIS — L89159 Pressure ulcer of sacral region, unspecified stage: Secondary | ICD-10-CM | POA: Diagnosis not present

## 2018-07-10 DIAGNOSIS — G40909 Epilepsy, unspecified, not intractable, without status epilepticus: Secondary | ICD-10-CM | POA: Diagnosis present

## 2018-07-10 DIAGNOSIS — Z87891 Personal history of nicotine dependence: Secondary | ICD-10-CM | POA: Diagnosis not present

## 2018-07-10 DIAGNOSIS — E1122 Type 2 diabetes mellitus with diabetic chronic kidney disease: Secondary | ICD-10-CM | POA: Diagnosis present

## 2018-07-10 DIAGNOSIS — R471 Dysarthria and anarthria: Secondary | ICD-10-CM | POA: Diagnosis not present

## 2018-07-10 DIAGNOSIS — A4159 Other Gram-negative sepsis: Secondary | ICD-10-CM | POA: Diagnosis not present

## 2018-07-10 DIAGNOSIS — F419 Anxiety disorder, unspecified: Secondary | ICD-10-CM | POA: Diagnosis not present

## 2018-07-10 DIAGNOSIS — R339 Retention of urine, unspecified: Secondary | ICD-10-CM | POA: Diagnosis not present

## 2018-07-10 DIAGNOSIS — R509 Fever, unspecified: Secondary | ICD-10-CM | POA: Diagnosis not present

## 2018-07-10 DIAGNOSIS — E1165 Type 2 diabetes mellitus with hyperglycemia: Secondary | ICD-10-CM | POA: Diagnosis present

## 2018-07-10 DIAGNOSIS — A4150 Gram-negative sepsis, unspecified: Secondary | ICD-10-CM | POA: Diagnosis not present

## 2018-07-10 DIAGNOSIS — E559 Vitamin D deficiency, unspecified: Secondary | ICD-10-CM | POA: Diagnosis not present

## 2018-07-10 DIAGNOSIS — I69322 Dysarthria following cerebral infarction: Secondary | ICD-10-CM | POA: Diagnosis not present

## 2018-07-10 DIAGNOSIS — F418 Other specified anxiety disorders: Secondary | ICD-10-CM | POA: Diagnosis not present

## 2018-07-10 DIAGNOSIS — I69321 Dysphasia following cerebral infarction: Secondary | ICD-10-CM | POA: Diagnosis not present

## 2018-07-10 DIAGNOSIS — I10 Essential (primary) hypertension: Secondary | ICD-10-CM | POA: Diagnosis not present

## 2018-07-10 DIAGNOSIS — Z8673 Personal history of transient ischemic attack (TIA), and cerebral infarction without residual deficits: Secondary | ICD-10-CM | POA: Diagnosis not present

## 2018-07-10 DIAGNOSIS — T83511D Infection and inflammatory reaction due to indwelling urethral catheter, subsequent encounter: Secondary | ICD-10-CM | POA: Diagnosis not present

## 2018-07-10 DIAGNOSIS — R7881 Bacteremia: Secondary | ICD-10-CM | POA: Diagnosis not present

## 2018-07-10 DIAGNOSIS — E870 Hyperosmolality and hypernatremia: Secondary | ICD-10-CM | POA: Diagnosis not present

## 2018-07-10 DIAGNOSIS — N319 Neuromuscular dysfunction of bladder, unspecified: Secondary | ICD-10-CM | POA: Diagnosis not present

## 2018-07-10 DIAGNOSIS — Z951 Presence of aortocoronary bypass graft: Secondary | ICD-10-CM | POA: Diagnosis not present

## 2018-07-10 DIAGNOSIS — Y846 Urinary catheterization as the cause of abnormal reaction of the patient, or of later complication, without mention of misadventure at the time of the procedure: Secondary | ICD-10-CM | POA: Diagnosis not present

## 2018-07-10 DIAGNOSIS — E860 Dehydration: Secondary | ICD-10-CM | POA: Diagnosis present

## 2018-07-10 DIAGNOSIS — I69391 Dysphagia following cerebral infarction: Secondary | ICD-10-CM | POA: Diagnosis not present

## 2018-07-10 DIAGNOSIS — I499 Cardiac arrhythmia, unspecified: Secondary | ICD-10-CM | POA: Diagnosis not present

## 2018-07-10 DIAGNOSIS — Z86718 Personal history of other venous thrombosis and embolism: Secondary | ICD-10-CM | POA: Diagnosis not present

## 2018-07-10 DIAGNOSIS — N390 Urinary tract infection, site not specified: Secondary | ICD-10-CM | POA: Diagnosis not present

## 2018-07-10 DIAGNOSIS — E119 Type 2 diabetes mellitus without complications: Secondary | ICD-10-CM | POA: Diagnosis not present

## 2018-07-10 DIAGNOSIS — E785 Hyperlipidemia, unspecified: Secondary | ICD-10-CM | POA: Diagnosis present

## 2018-07-10 DIAGNOSIS — Z960 Presence of urogenital implants: Secondary | ICD-10-CM | POA: Diagnosis not present

## 2018-07-10 DIAGNOSIS — Z452 Encounter for adjustment and management of vascular access device: Secondary | ICD-10-CM | POA: Diagnosis not present

## 2018-07-10 DIAGNOSIS — R1312 Dysphagia, oropharyngeal phase: Secondary | ICD-10-CM | POA: Diagnosis not present

## 2018-07-10 DIAGNOSIS — R531 Weakness: Secondary | ICD-10-CM | POA: Diagnosis not present

## 2018-07-10 DIAGNOSIS — I69398 Other sequelae of cerebral infarction: Secondary | ICD-10-CM | POA: Diagnosis not present

## 2018-07-10 DIAGNOSIS — A499 Bacterial infection, unspecified: Secondary | ICD-10-CM | POA: Diagnosis not present

## 2018-07-10 DIAGNOSIS — A4151 Sepsis due to Escherichia coli [E. coli]: Secondary | ICD-10-CM | POA: Diagnosis present

## 2018-07-10 DIAGNOSIS — A419 Sepsis, unspecified organism: Secondary | ICD-10-CM | POA: Diagnosis not present

## 2018-07-10 DIAGNOSIS — I69365 Other paralytic syndrome following cerebral infarction, bilateral: Secondary | ICD-10-CM | POA: Diagnosis not present

## 2018-07-10 DIAGNOSIS — L89894 Pressure ulcer of other site, stage 4: Secondary | ICD-10-CM | POA: Diagnosis present

## 2018-07-10 DIAGNOSIS — Z431 Encounter for attention to gastrostomy: Secondary | ICD-10-CM | POA: Diagnosis not present

## 2018-07-10 DIAGNOSIS — I5022 Chronic systolic (congestive) heart failure: Secondary | ICD-10-CM | POA: Diagnosis present

## 2018-07-18 DIAGNOSIS — Z1612 Extended spectrum beta lactamase (ESBL) resistance: Secondary | ICD-10-CM | POA: Diagnosis not present

## 2018-07-18 DIAGNOSIS — T83511D Infection and inflammatory reaction due to indwelling urethral catheter, subsequent encounter: Secondary | ICD-10-CM | POA: Diagnosis not present

## 2018-07-18 DIAGNOSIS — Z86718 Personal history of other venous thrombosis and embolism: Secondary | ICD-10-CM | POA: Diagnosis not present

## 2018-07-18 DIAGNOSIS — L97429 Non-pressure chronic ulcer of left heel and midfoot with unspecified severity: Secondary | ICD-10-CM | POA: Diagnosis not present

## 2018-07-18 DIAGNOSIS — E870 Hyperosmolality and hypernatremia: Secondary | ICD-10-CM | POA: Diagnosis not present

## 2018-07-18 DIAGNOSIS — M6281 Muscle weakness (generalized): Secondary | ICD-10-CM | POA: Diagnosis not present

## 2018-07-18 DIAGNOSIS — F418 Other specified anxiety disorders: Secondary | ICD-10-CM | POA: Diagnosis not present

## 2018-07-18 DIAGNOSIS — I69391 Dysphagia following cerebral infarction: Secondary | ICD-10-CM | POA: Diagnosis not present

## 2018-07-18 DIAGNOSIS — I2510 Atherosclerotic heart disease of native coronary artery without angina pectoris: Secondary | ICD-10-CM | POA: Diagnosis not present

## 2018-07-18 DIAGNOSIS — Z8673 Personal history of transient ischemic attack (TIA), and cerebral infarction without residual deficits: Secondary | ICD-10-CM | POA: Diagnosis not present

## 2018-07-18 DIAGNOSIS — Z7401 Bed confinement status: Secondary | ICD-10-CM | POA: Diagnosis not present

## 2018-07-18 DIAGNOSIS — M2550 Pain in unspecified joint: Secondary | ICD-10-CM | POA: Diagnosis not present

## 2018-07-18 DIAGNOSIS — E440 Moderate protein-calorie malnutrition: Secondary | ICD-10-CM | POA: Diagnosis not present

## 2018-07-18 DIAGNOSIS — I69322 Dysarthria following cerebral infarction: Secondary | ICD-10-CM | POA: Diagnosis not present

## 2018-07-18 DIAGNOSIS — I69398 Other sequelae of cerebral infarction: Secondary | ICD-10-CM | POA: Diagnosis not present

## 2018-07-18 DIAGNOSIS — G8250 Quadriplegia, unspecified: Secondary | ICD-10-CM | POA: Diagnosis not present

## 2018-07-18 DIAGNOSIS — Z951 Presence of aortocoronary bypass graft: Secondary | ICD-10-CM | POA: Diagnosis not present

## 2018-07-18 DIAGNOSIS — R471 Dysarthria and anarthria: Secondary | ICD-10-CM | POA: Diagnosis not present

## 2018-07-18 DIAGNOSIS — I5022 Chronic systolic (congestive) heart failure: Secondary | ICD-10-CM | POA: Diagnosis not present

## 2018-07-18 DIAGNOSIS — E1165 Type 2 diabetes mellitus with hyperglycemia: Secondary | ICD-10-CM | POA: Diagnosis not present

## 2018-07-18 DIAGNOSIS — A499 Bacterial infection, unspecified: Secondary | ICD-10-CM | POA: Diagnosis not present

## 2018-07-18 DIAGNOSIS — I110 Hypertensive heart disease with heart failure: Secondary | ICD-10-CM | POA: Diagnosis not present

## 2018-07-18 DIAGNOSIS — L89159 Pressure ulcer of sacral region, unspecified stage: Secondary | ICD-10-CM | POA: Diagnosis not present

## 2018-07-18 DIAGNOSIS — G459 Transient cerebral ischemic attack, unspecified: Secondary | ICD-10-CM | POA: Diagnosis not present

## 2018-07-18 DIAGNOSIS — G40509 Epileptic seizures related to external causes, not intractable, without status epilepticus: Secondary | ICD-10-CM | POA: Diagnosis not present

## 2018-07-18 DIAGNOSIS — N390 Urinary tract infection, site not specified: Secondary | ICD-10-CM | POA: Diagnosis not present

## 2018-07-18 DIAGNOSIS — A4151 Sepsis due to Escherichia coli [E. coli]: Secondary | ICD-10-CM | POA: Diagnosis not present

## 2018-07-18 DIAGNOSIS — A4150 Gram-negative sepsis, unspecified: Secondary | ICD-10-CM | POA: Diagnosis not present

## 2018-07-18 DIAGNOSIS — E559 Vitamin D deficiency, unspecified: Secondary | ICD-10-CM | POA: Diagnosis not present

## 2018-07-18 DIAGNOSIS — Z7901 Long term (current) use of anticoagulants: Secondary | ICD-10-CM | POA: Diagnosis not present

## 2018-07-18 DIAGNOSIS — E119 Type 2 diabetes mellitus without complications: Secondary | ICD-10-CM | POA: Diagnosis not present

## 2018-07-18 DIAGNOSIS — L89893 Pressure ulcer of other site, stage 3: Secondary | ICD-10-CM | POA: Diagnosis not present

## 2018-07-18 DIAGNOSIS — I10 Essential (primary) hypertension: Secondary | ICD-10-CM | POA: Diagnosis not present

## 2018-07-18 DIAGNOSIS — R531 Weakness: Secondary | ICD-10-CM | POA: Diagnosis not present

## 2018-07-18 DIAGNOSIS — N319 Neuromuscular dysfunction of bladder, unspecified: Secondary | ICD-10-CM | POA: Diagnosis not present

## 2018-07-18 DIAGNOSIS — R1312 Dysphagia, oropharyngeal phase: Secondary | ICD-10-CM | POA: Diagnosis not present

## 2018-07-18 DIAGNOSIS — I82401 Acute embolism and thrombosis of unspecified deep veins of right lower extremity: Secondary | ICD-10-CM | POA: Diagnosis not present

## 2018-07-18 DIAGNOSIS — I679 Cerebrovascular disease, unspecified: Secondary | ICD-10-CM | POA: Diagnosis not present

## 2018-07-18 DIAGNOSIS — Z431 Encounter for attention to gastrostomy: Secondary | ICD-10-CM | POA: Diagnosis not present

## 2018-07-18 DIAGNOSIS — R41841 Cognitive communication deficit: Secondary | ICD-10-CM | POA: Diagnosis not present

## 2018-07-18 DIAGNOSIS — M6250 Muscle wasting and atrophy, not elsewhere classified, unspecified site: Secondary | ICD-10-CM | POA: Diagnosis not present

## 2018-07-19 DIAGNOSIS — E119 Type 2 diabetes mellitus without complications: Secondary | ICD-10-CM | POA: Diagnosis not present

## 2018-07-19 DIAGNOSIS — A499 Bacterial infection, unspecified: Secondary | ICD-10-CM | POA: Diagnosis not present

## 2018-07-19 DIAGNOSIS — A4151 Sepsis due to Escherichia coli [E. coli]: Secondary | ICD-10-CM | POA: Diagnosis not present

## 2018-07-19 DIAGNOSIS — Z1612 Extended spectrum beta lactamase (ESBL) resistance: Secondary | ICD-10-CM | POA: Diagnosis not present

## 2018-07-20 DIAGNOSIS — A499 Bacterial infection, unspecified: Secondary | ICD-10-CM | POA: Diagnosis not present

## 2018-07-20 DIAGNOSIS — L97429 Non-pressure chronic ulcer of left heel and midfoot with unspecified severity: Secondary | ICD-10-CM | POA: Diagnosis not present

## 2018-07-20 DIAGNOSIS — A4151 Sepsis due to Escherichia coli [E. coli]: Secondary | ICD-10-CM | POA: Diagnosis not present

## 2018-07-20 DIAGNOSIS — Z1612 Extended spectrum beta lactamase (ESBL) resistance: Secondary | ICD-10-CM | POA: Diagnosis not present

## 2018-07-20 DIAGNOSIS — E119 Type 2 diabetes mellitus without complications: Secondary | ICD-10-CM | POA: Diagnosis not present

## 2018-07-27 DIAGNOSIS — Z1612 Extended spectrum beta lactamase (ESBL) resistance: Secondary | ICD-10-CM | POA: Diagnosis not present

## 2018-07-27 DIAGNOSIS — L89893 Pressure ulcer of other site, stage 3: Secondary | ICD-10-CM | POA: Diagnosis not present

## 2018-07-27 DIAGNOSIS — L97429 Non-pressure chronic ulcer of left heel and midfoot with unspecified severity: Secondary | ICD-10-CM | POA: Diagnosis not present

## 2018-07-27 DIAGNOSIS — E119 Type 2 diabetes mellitus without complications: Secondary | ICD-10-CM | POA: Diagnosis not present

## 2018-07-27 DIAGNOSIS — A4151 Sepsis due to Escherichia coli [E. coli]: Secondary | ICD-10-CM | POA: Diagnosis not present

## 2018-07-27 DIAGNOSIS — A499 Bacterial infection, unspecified: Secondary | ICD-10-CM | POA: Diagnosis not present

## 2018-08-04 DIAGNOSIS — Z1612 Extended spectrum beta lactamase (ESBL) resistance: Secondary | ICD-10-CM | POA: Diagnosis not present

## 2018-08-04 DIAGNOSIS — A4151 Sepsis due to Escherichia coli [E. coli]: Secondary | ICD-10-CM | POA: Diagnosis not present

## 2018-08-04 DIAGNOSIS — E119 Type 2 diabetes mellitus without complications: Secondary | ICD-10-CM | POA: Diagnosis not present

## 2018-08-04 DIAGNOSIS — A499 Bacterial infection, unspecified: Secondary | ICD-10-CM | POA: Diagnosis not present

## 2018-08-04 IMAGING — CT CT HEAD W/O CM
3 series · 15 of 47 positions shown, 18 images · non-contrast
Comparison: Brain MRI 05/06/2016

CLINICAL DATA: Altered mental status

EXAM:
CT HEAD WITHOUT CONTRAST
TECHNIQUE: Contiguous axial images were obtained from the base of the skull
through the vertex without intravenous contrast.

[Series 3: head 5.0 h30s · axial · 0.46mm/px · z∈[+1104,+1249]mm · 9 of 35 slices shown, 12 images]
[im 3/35  brain]
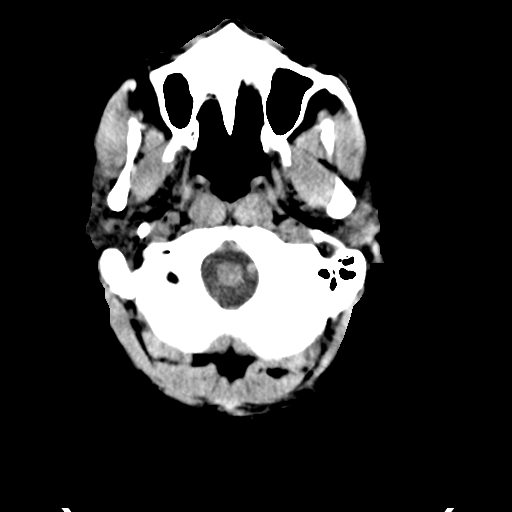
[im 3/35  bone]
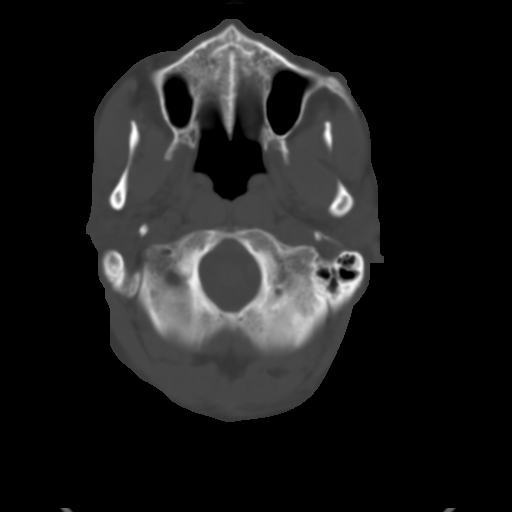
[im 6/35  brain]
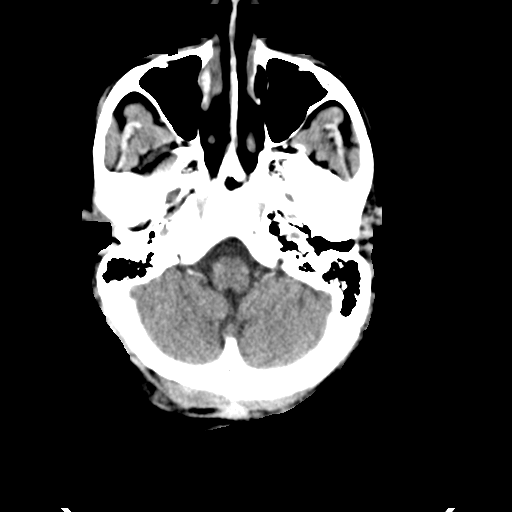
[im 10/35  brain]
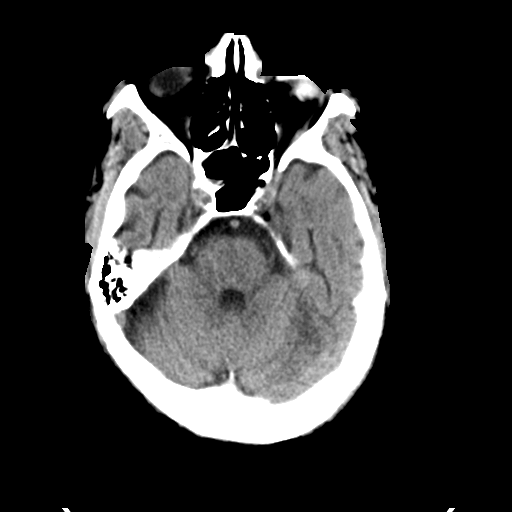
[im 13/35  brain]
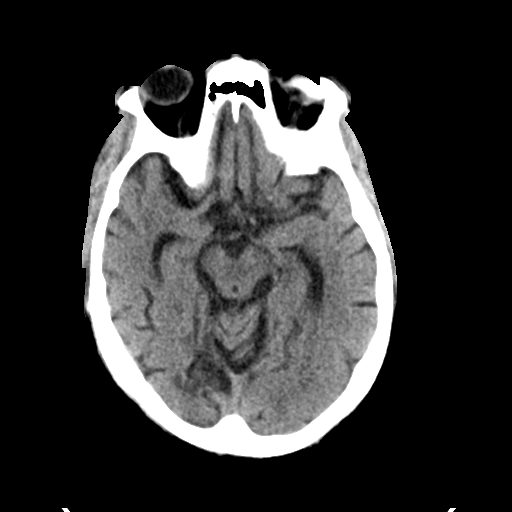
[im 18/35  brain]
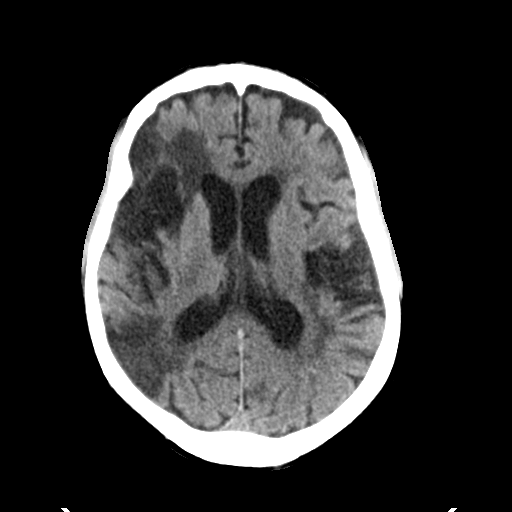
[im 18/35  bone]
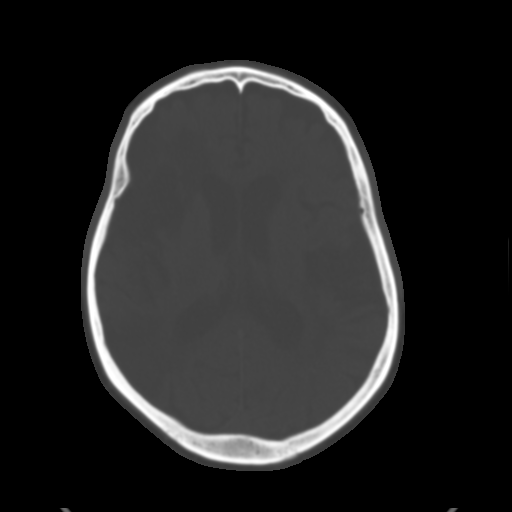
[im 22/35  brain]
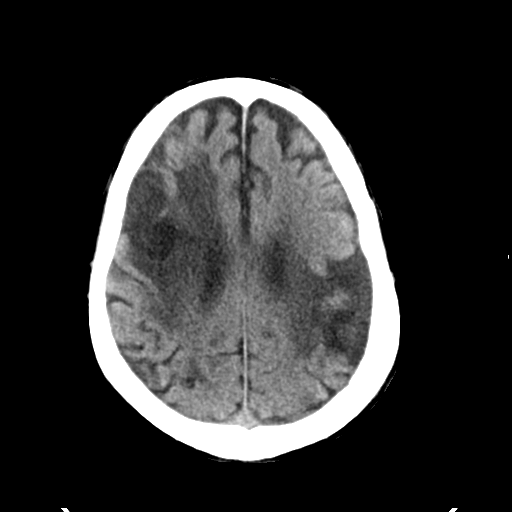
[im 25/35  brain]
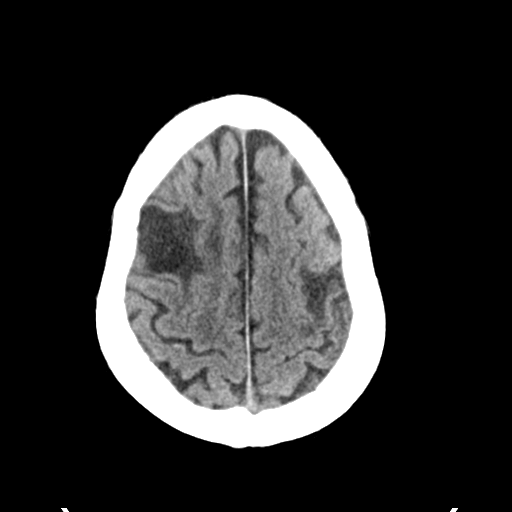
[im 29/35  brain]
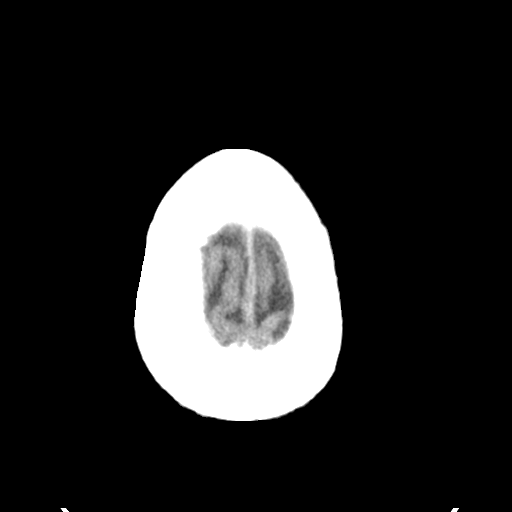
[im 32/35  brain]
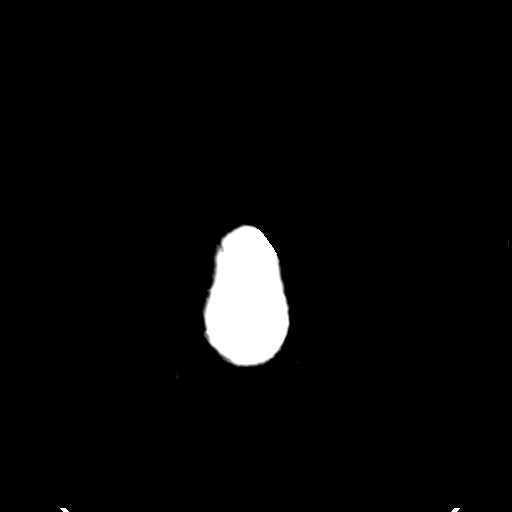
[im 32/35  bone]
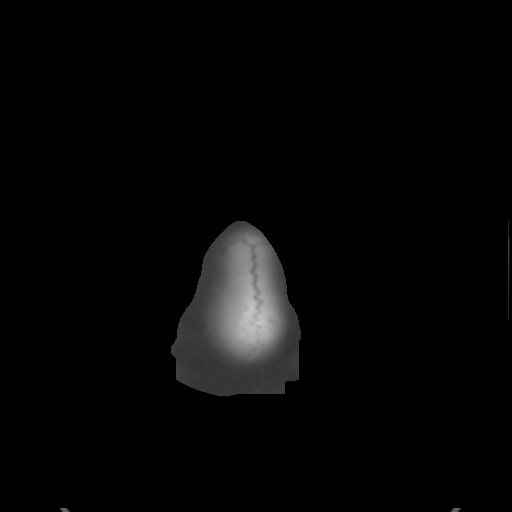

[Series 5: head 3.0 mpr cor · coronal · 0.33mm/px · 3 of 71 slices shown]
[im 24/71  brain]
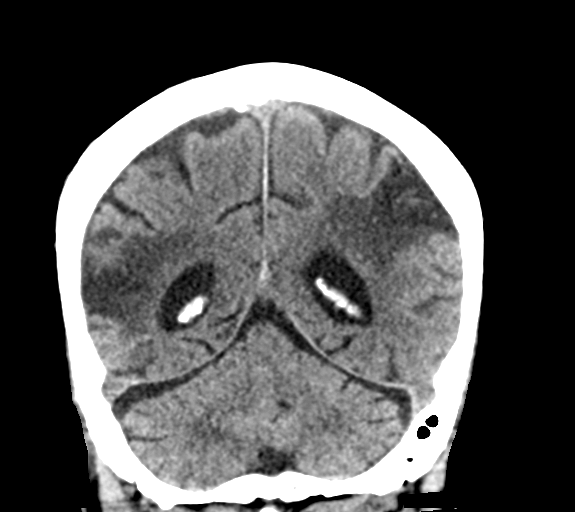
[im 32/71  brain]
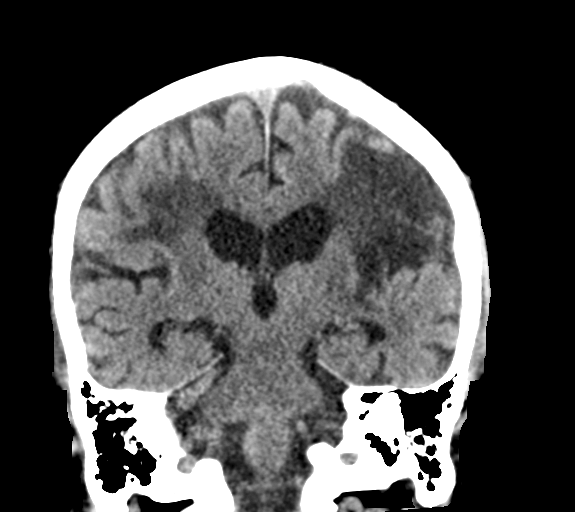
[im 39/71  brain]
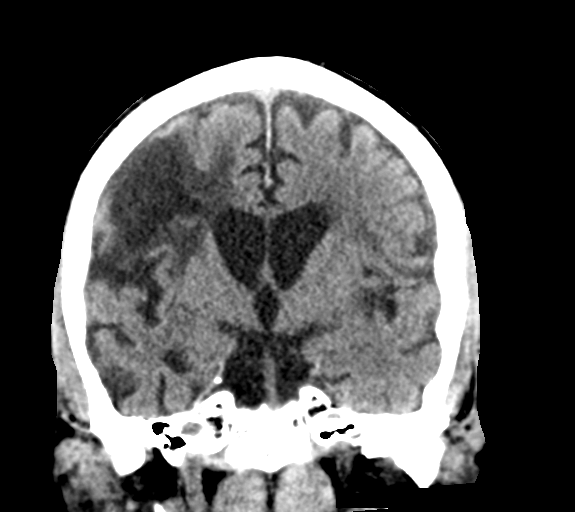

[Series 6: head 3.0 mpr sag · sagittal · 0.33mm/px · 3 of 61 slices shown]
[im 21/61  brain]
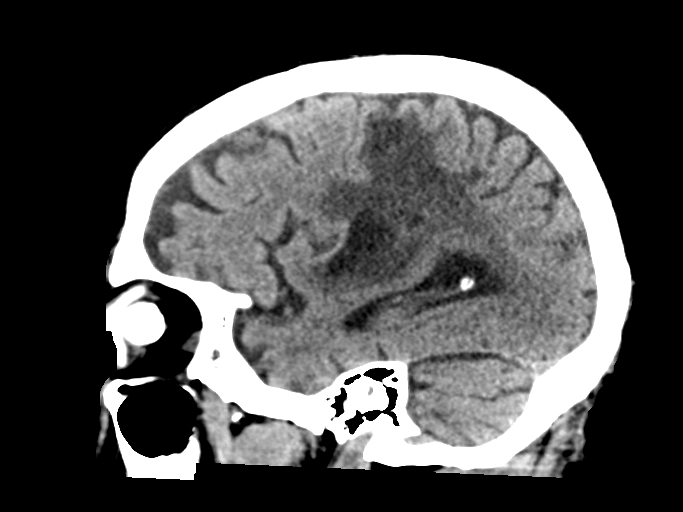
[im 31/61  brain]
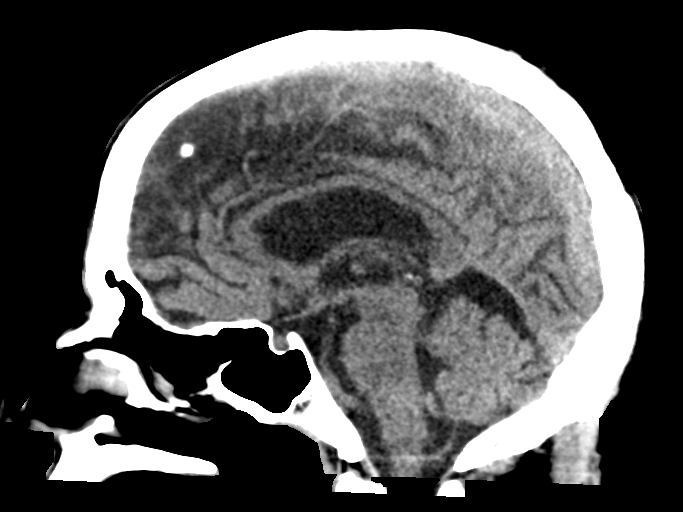
[im 41/61  brain]
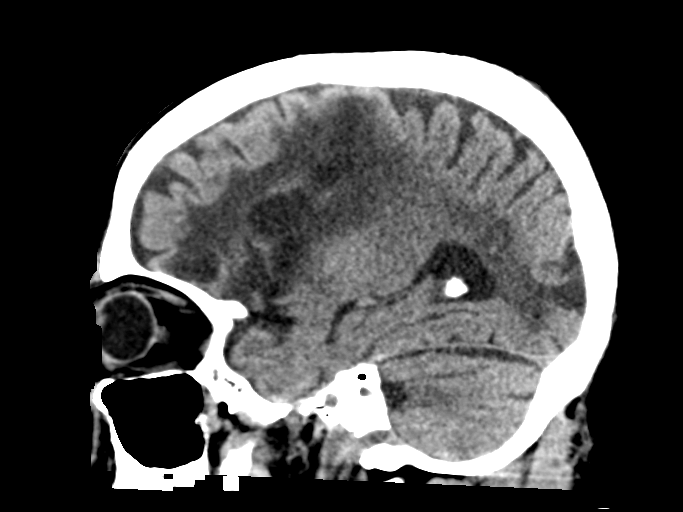

[15 of 47 positions shown; findings below may reference images not displayed]

FINDINGS: Brain: No mass lesion, intraparenchymal hemorrhage or extra-axial
collection. No evidence of acute cortical infarct. Severe
encephalomalacia at multiple bilateral MCA distribution locations.

Vascular: No hyperdense vessel or unexpected calcification.

Skull: Normal visualized skull base, calvarium and extracranial soft
tissues.

Sinuses/Orbits: No sinus fluid levels or advanced mucosal
thickening. No mastoid effusion. Left globe prosthesis.
IMPRESSION: 1. No acute abnormality.
2. Severe encephalomalacia at the site of multiple remote MCA
distribution infarcts bilaterally.

## 2018-08-04 IMAGING — DX DG CHEST 1V PORT
1 series · 1 of 1 positions shown · non-contrast
Comparison: Radiograph January 02, 2017.

CLINICAL DATA: Status post PICC placement.

EXAM:
PORTABLE CHEST 1 VIEW

[chest]
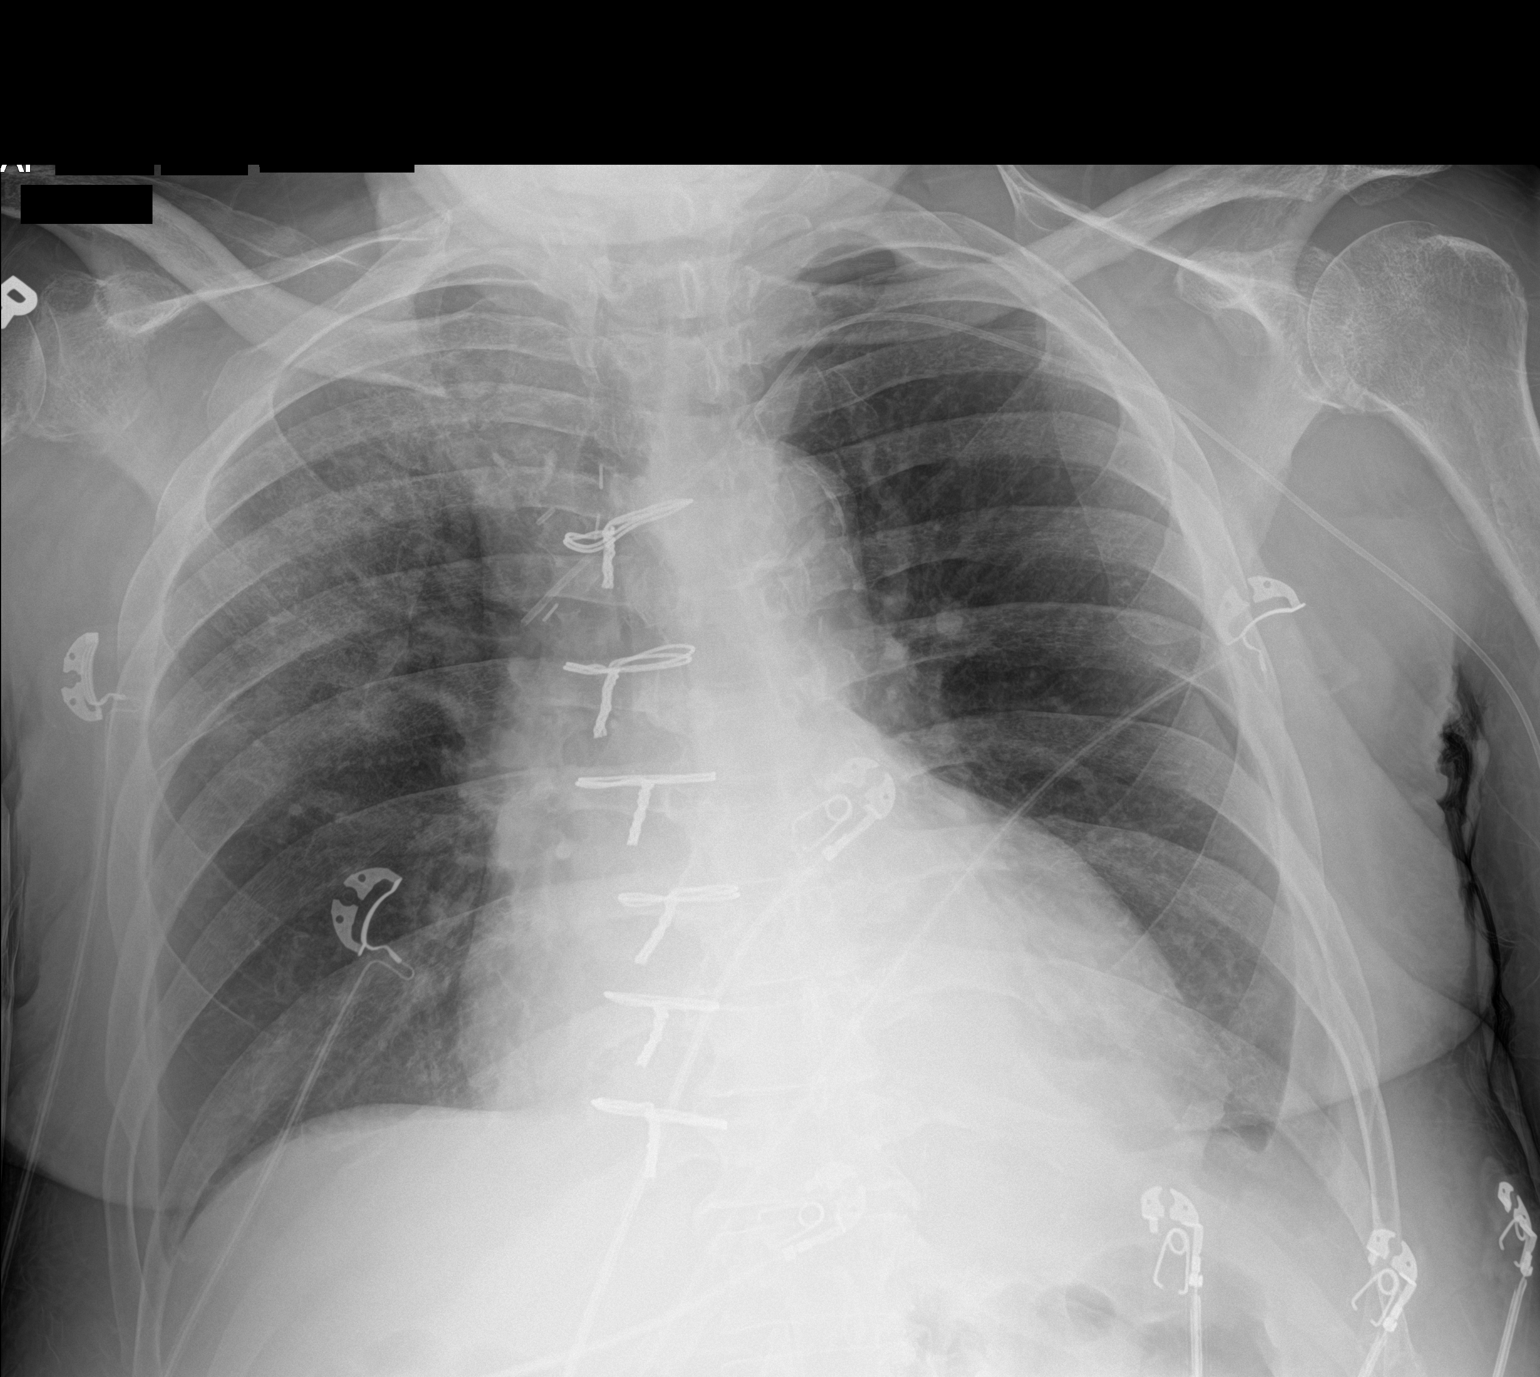

[1 of 1 positions shown; findings below may reference images not displayed]

FINDINGS: Stable cardiomegaly is noted. Atherosclerosis of thoracic aorta is
noted. Sternotomy wires are noted. Interval placement of left-sided
PICC line with distal tip seen in expected position of the SVC. Mild
left basilar subsegmental atelectasis is noted with associated
minimal pleural effusion. Minimal right pleural effusion is noted.
Right upper lobe opacity is noted concerning for possible pneumonia.
Bony thorax is unremarkable. No pneumothorax is noted.
IMPRESSION: Interval placement of left-sided PICC line with distal tip in
expected position of the SVC. Aortic atherosclerosis. Minimal
bilateral pleural effusions are noted. Mild left basilar
subsegmental atelectasis is noted. Right upper lobe opacity is noted
concerning for possible pneumonia.

## 2018-08-09 DIAGNOSIS — Z931 Gastrostomy status: Secondary | ICD-10-CM | POA: Diagnosis not present

## 2018-08-09 DIAGNOSIS — L8990 Pressure ulcer of unspecified site, unspecified stage: Secondary | ICD-10-CM | POA: Diagnosis not present

## 2018-08-09 DIAGNOSIS — R609 Edema, unspecified: Secondary | ICD-10-CM | POA: Diagnosis not present

## 2018-08-09 DIAGNOSIS — Z0000 Encounter for general adult medical examination without abnormal findings: Secondary | ICD-10-CM | POA: Diagnosis not present

## 2018-08-09 DIAGNOSIS — Z7901 Long term (current) use of anticoagulants: Secondary | ICD-10-CM | POA: Diagnosis not present

## 2018-08-09 DIAGNOSIS — N390 Urinary tract infection, site not specified: Secondary | ICD-10-CM | POA: Diagnosis not present

## 2018-08-09 IMAGING — CR DG ABD PORTABLE 1V
1 series · 1 of 1 positions shown · non-contrast
Comparison: Abdominal radiograph of February 05, 2017

CLINICAL DATA: Vomiting

EXAM:
PORTABLE ABDOMEN - 1 VIEW

[AP]
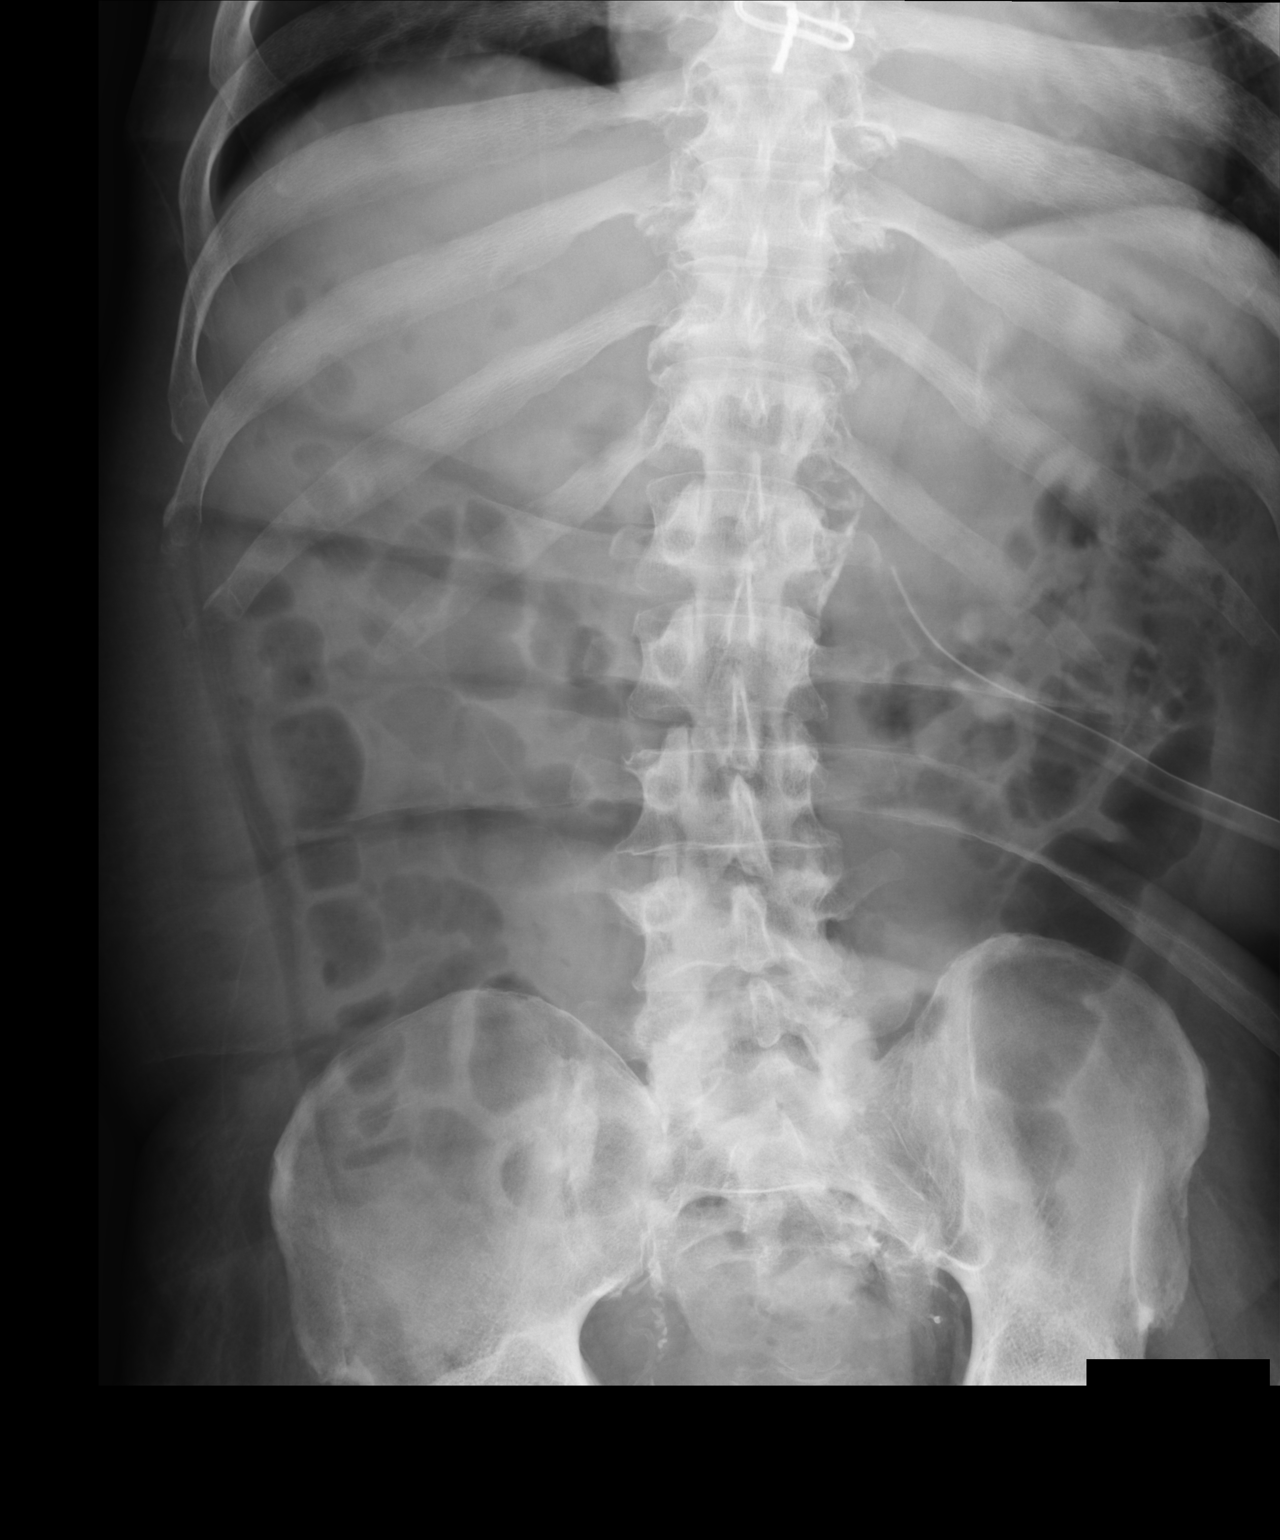

[1 of 1 positions shown; findings below may reference images not displayed]

FINDINGS: There are loops of normal caliber gas-filled small and large bowel
throughout the abdomen. No free extraluminal gas collections are
observed. A gastrostomy tube is in place to the left of midline. The
bony structures exhibit no acute abnormalities.
IMPRESSION: Fairly stable appearance of the abdomen. No evidence of bowel
obstruction.

## 2018-08-11 DIAGNOSIS — R109 Unspecified abdominal pain: Secondary | ICD-10-CM | POA: Diagnosis not present

## 2018-08-17 DIAGNOSIS — Z8679 Personal history of other diseases of the circulatory system: Secondary | ICD-10-CM | POA: Diagnosis not present

## 2018-08-17 DIAGNOSIS — Z7901 Long term (current) use of anticoagulants: Secondary | ICD-10-CM | POA: Diagnosis not present

## 2018-08-17 DIAGNOSIS — I82591 Chronic embolism and thrombosis of other specified deep vein of right lower extremity: Secondary | ICD-10-CM | POA: Diagnosis not present

## 2018-08-17 DIAGNOSIS — I5022 Chronic systolic (congestive) heart failure: Secondary | ICD-10-CM | POA: Diagnosis not present

## 2018-08-17 DIAGNOSIS — I25799 Atherosclerosis of other coronary artery bypass graft(s) with unspecified angina pectoris: Secondary | ICD-10-CM | POA: Diagnosis not present

## 2018-08-17 DIAGNOSIS — E875 Hyperkalemia: Secondary | ICD-10-CM | POA: Diagnosis not present

## 2018-08-17 DIAGNOSIS — I472 Ventricular tachycardia: Secondary | ICD-10-CM | POA: Diagnosis not present

## 2018-08-17 DIAGNOSIS — I110 Hypertensive heart disease with heart failure: Secondary | ICD-10-CM | POA: Diagnosis not present

## 2018-08-28 DIAGNOSIS — E119 Type 2 diabetes mellitus without complications: Secondary | ICD-10-CM | POA: Diagnosis not present

## 2018-09-13 DIAGNOSIS — Z7189 Other specified counseling: Secondary | ICD-10-CM | POA: Diagnosis not present

## 2018-09-13 DIAGNOSIS — G894 Chronic pain syndrome: Secondary | ICD-10-CM | POA: Diagnosis not present

## 2018-09-13 DIAGNOSIS — I5022 Chronic systolic (congestive) heart failure: Secondary | ICD-10-CM | POA: Diagnosis not present

## 2018-09-13 DIAGNOSIS — E119 Type 2 diabetes mellitus without complications: Secondary | ICD-10-CM | POA: Diagnosis not present

## 2018-09-13 DIAGNOSIS — Z7901 Long term (current) use of anticoagulants: Secondary | ICD-10-CM | POA: Diagnosis not present

## 2018-09-13 DIAGNOSIS — L8990 Pressure ulcer of unspecified site, unspecified stage: Secondary | ICD-10-CM | POA: Diagnosis not present

## 2018-10-04 DIAGNOSIS — R52 Pain, unspecified: Secondary | ICD-10-CM | POA: Diagnosis not present

## 2018-10-04 DIAGNOSIS — R109 Unspecified abdominal pain: Secondary | ICD-10-CM | POA: Diagnosis not present

## 2018-10-04 DIAGNOSIS — K8020 Calculus of gallbladder without cholecystitis without obstruction: Secondary | ICD-10-CM | POA: Diagnosis not present

## 2018-10-04 DIAGNOSIS — R4702 Dysphasia: Secondary | ICD-10-CM | POA: Diagnosis not present

## 2018-10-04 DIAGNOSIS — T83511A Infection and inflammatory reaction due to indwelling urethral catheter, initial encounter: Secondary | ICD-10-CM | POA: Diagnosis not present

## 2018-10-04 DIAGNOSIS — Z466 Encounter for fitting and adjustment of urinary device: Secondary | ICD-10-CM | POA: Diagnosis not present

## 2018-10-04 DIAGNOSIS — N390 Urinary tract infection, site not specified: Secondary | ICD-10-CM | POA: Diagnosis not present

## 2018-10-04 DIAGNOSIS — R05 Cough: Secondary | ICD-10-CM | POA: Diagnosis not present

## 2018-10-04 DIAGNOSIS — Y998 Other external cause status: Secondary | ICD-10-CM | POA: Diagnosis not present

## 2018-10-04 DIAGNOSIS — B964 Proteus (mirabilis) (morganii) as the cause of diseases classified elsewhere: Secondary | ICD-10-CM | POA: Diagnosis not present

## 2018-10-04 DIAGNOSIS — Y846 Urinary catheterization as the cause of abnormal reaction of the patient, or of later complication, without mention of misadventure at the time of the procedure: Secondary | ICD-10-CM | POA: Diagnosis not present

## 2018-10-04 DIAGNOSIS — I69398 Other sequelae of cerebral infarction: Secondary | ICD-10-CM | POA: Diagnosis not present

## 2018-10-04 DIAGNOSIS — B961 Klebsiella pneumoniae [K. pneumoniae] as the cause of diseases classified elsewhere: Secondary | ICD-10-CM | POA: Diagnosis not present

## 2018-10-04 DIAGNOSIS — R748 Abnormal levels of other serum enzymes: Secondary | ICD-10-CM | POA: Diagnosis not present

## 2018-10-04 DIAGNOSIS — N200 Calculus of kidney: Secondary | ICD-10-CM | POA: Diagnosis not present

## 2018-10-04 DIAGNOSIS — R0602 Shortness of breath: Secondary | ICD-10-CM | POA: Diagnosis not present

## 2018-10-09 DIAGNOSIS — I10 Essential (primary) hypertension: Secondary | ICD-10-CM | POA: Diagnosis not present

## 2018-10-09 DIAGNOSIS — Z931 Gastrostomy status: Secondary | ICD-10-CM | POA: Diagnosis not present

## 2018-10-09 DIAGNOSIS — N3001 Acute cystitis with hematuria: Secondary | ICD-10-CM | POA: Diagnosis present

## 2018-10-09 DIAGNOSIS — M2550 Pain in unspecified joint: Secondary | ICD-10-CM | POA: Diagnosis not present

## 2018-10-09 DIAGNOSIS — I959 Hypotension, unspecified: Secondary | ICD-10-CM | POA: Diagnosis not present

## 2018-10-09 DIAGNOSIS — Z209 Contact with and (suspected) exposure to unspecified communicable disease: Secondary | ICD-10-CM | POA: Diagnosis not present

## 2018-10-09 DIAGNOSIS — G629 Polyneuropathy, unspecified: Secondary | ICD-10-CM | POA: Diagnosis not present

## 2018-10-09 DIAGNOSIS — I69322 Dysarthria following cerebral infarction: Secondary | ICD-10-CM | POA: Diagnosis not present

## 2018-10-09 DIAGNOSIS — E1169 Type 2 diabetes mellitus with other specified complication: Secondary | ICD-10-CM | POA: Diagnosis present

## 2018-10-09 DIAGNOSIS — A419 Sepsis, unspecified organism: Secondary | ICD-10-CM | POA: Diagnosis not present

## 2018-10-09 DIAGNOSIS — G8250 Quadriplegia, unspecified: Secondary | ICD-10-CM | POA: Diagnosis present

## 2018-10-09 DIAGNOSIS — N319 Neuromuscular dysfunction of bladder, unspecified: Secondary | ICD-10-CM | POA: Diagnosis not present

## 2018-10-09 DIAGNOSIS — E875 Hyperkalemia: Secondary | ICD-10-CM | POA: Diagnosis present

## 2018-10-09 DIAGNOSIS — I69920 Aphasia following unspecified cerebrovascular disease: Secondary | ICD-10-CM | POA: Diagnosis not present

## 2018-10-09 DIAGNOSIS — N390 Urinary tract infection, site not specified: Secondary | ICD-10-CM | POA: Diagnosis not present

## 2018-10-09 DIAGNOSIS — Z87440 Personal history of urinary (tract) infections: Secondary | ICD-10-CM | POA: Diagnosis not present

## 2018-10-09 DIAGNOSIS — Y998 Other external cause status: Secondary | ICD-10-CM | POA: Diagnosis not present

## 2018-10-09 DIAGNOSIS — R4701 Aphasia: Secondary | ICD-10-CM | POA: Diagnosis not present

## 2018-10-09 DIAGNOSIS — R278 Other lack of coordination: Secondary | ICD-10-CM | POA: Diagnosis not present

## 2018-10-09 DIAGNOSIS — B961 Klebsiella pneumoniae [K. pneumoniae] as the cause of diseases classified elsewhere: Secondary | ICD-10-CM | POA: Diagnosis not present

## 2018-10-09 DIAGNOSIS — F329 Major depressive disorder, single episode, unspecified: Secondary | ICD-10-CM | POA: Diagnosis not present

## 2018-10-09 DIAGNOSIS — R509 Fever, unspecified: Secondary | ICD-10-CM | POA: Diagnosis not present

## 2018-10-09 DIAGNOSIS — Z7401 Bed confinement status: Secondary | ICD-10-CM | POA: Diagnosis not present

## 2018-10-09 DIAGNOSIS — B964 Proteus (mirabilis) (morganii) as the cause of diseases classified elsewhere: Secondary | ICD-10-CM | POA: Diagnosis not present

## 2018-10-09 DIAGNOSIS — E1165 Type 2 diabetes mellitus with hyperglycemia: Secondary | ICD-10-CM | POA: Diagnosis not present

## 2018-10-09 DIAGNOSIS — I2510 Atherosclerotic heart disease of native coronary artery without angina pectoris: Secondary | ICD-10-CM | POA: Diagnosis not present

## 2018-10-09 DIAGNOSIS — Z960 Presence of urogenital implants: Secondary | ICD-10-CM | POA: Diagnosis not present

## 2018-10-09 DIAGNOSIS — F419 Anxiety disorder, unspecified: Secondary | ICD-10-CM | POA: Diagnosis present

## 2018-10-09 DIAGNOSIS — T83518A Infection and inflammatory reaction due to other urinary catheter, initial encounter: Secondary | ICD-10-CM | POA: Diagnosis present

## 2018-10-09 DIAGNOSIS — D649 Anemia, unspecified: Secondary | ICD-10-CM | POA: Diagnosis present

## 2018-10-09 DIAGNOSIS — A498 Other bacterial infections of unspecified site: Secondary | ICD-10-CM | POA: Diagnosis not present

## 2018-10-09 DIAGNOSIS — R1084 Generalized abdominal pain: Secondary | ICD-10-CM | POA: Diagnosis not present

## 2018-10-09 DIAGNOSIS — T83511A Infection and inflammatory reaction due to indwelling urethral catheter, initial encounter: Secondary | ICD-10-CM | POA: Diagnosis not present

## 2018-10-09 DIAGNOSIS — Z86718 Personal history of other venous thrombosis and embolism: Secondary | ICD-10-CM | POA: Diagnosis not present

## 2018-10-09 DIAGNOSIS — Z1159 Encounter for screening for other viral diseases: Secondary | ICD-10-CM | POA: Diagnosis not present

## 2018-10-09 DIAGNOSIS — M6281 Muscle weakness (generalized): Secondary | ICD-10-CM | POA: Diagnosis not present

## 2018-10-09 DIAGNOSIS — Z8673 Personal history of transient ischemic attack (TIA), and cerebral infarction without residual deficits: Secondary | ICD-10-CM | POA: Diagnosis not present

## 2018-10-09 DIAGNOSIS — L89894 Pressure ulcer of other site, stage 4: Secondary | ICD-10-CM | POA: Diagnosis present

## 2018-10-09 DIAGNOSIS — I5022 Chronic systolic (congestive) heart failure: Secondary | ICD-10-CM | POA: Diagnosis not present

## 2018-10-09 DIAGNOSIS — N3000 Acute cystitis without hematuria: Secondary | ICD-10-CM | POA: Diagnosis not present

## 2018-10-09 DIAGNOSIS — R569 Unspecified convulsions: Secondary | ICD-10-CM | POA: Diagnosis not present

## 2018-10-09 DIAGNOSIS — E785 Hyperlipidemia, unspecified: Secondary | ICD-10-CM | POA: Diagnosis present

## 2018-10-09 DIAGNOSIS — E440 Moderate protein-calorie malnutrition: Secondary | ICD-10-CM | POA: Diagnosis present

## 2018-10-09 DIAGNOSIS — E119 Type 2 diabetes mellitus without complications: Secondary | ICD-10-CM | POA: Diagnosis not present

## 2018-10-09 DIAGNOSIS — B965 Pseudomonas (aeruginosa) (mallei) (pseudomallei) as the cause of diseases classified elsewhere: Secondary | ICD-10-CM | POA: Diagnosis not present

## 2018-10-09 DIAGNOSIS — R109 Unspecified abdominal pain: Secondary | ICD-10-CM | POA: Diagnosis not present

## 2018-10-09 DIAGNOSIS — Y846 Urinary catheterization as the cause of abnormal reaction of the patient, or of later complication, without mention of misadventure at the time of the procedure: Secondary | ICD-10-CM | POA: Diagnosis not present

## 2018-10-09 DIAGNOSIS — I110 Hypertensive heart disease with heart failure: Secondary | ICD-10-CM | POA: Diagnosis present

## 2018-10-09 DIAGNOSIS — I472 Ventricular tachycardia: Secondary | ICD-10-CM | POA: Diagnosis present

## 2018-10-09 DIAGNOSIS — R1030 Lower abdominal pain, unspecified: Secondary | ICD-10-CM | POA: Diagnosis not present

## 2018-10-09 DIAGNOSIS — I69320 Aphasia following cerebral infarction: Secondary | ICD-10-CM | POA: Diagnosis not present

## 2018-10-09 DIAGNOSIS — M6250 Muscle wasting and atrophy, not elsewhere classified, unspecified site: Secondary | ICD-10-CM | POA: Diagnosis not present

## 2018-10-09 DIAGNOSIS — L89893 Pressure ulcer of other site, stage 3: Secondary | ICD-10-CM | POA: Diagnosis not present

## 2018-10-09 DIAGNOSIS — A4159 Other Gram-negative sepsis: Secondary | ICD-10-CM | POA: Diagnosis present

## 2018-10-09 DIAGNOSIS — G459 Transient cerebral ischemic attack, unspecified: Secondary | ICD-10-CM | POA: Diagnosis not present

## 2018-10-09 DIAGNOSIS — R339 Retention of urine, unspecified: Secondary | ICD-10-CM | POA: Diagnosis not present

## 2018-10-09 DIAGNOSIS — R404 Transient alteration of awareness: Secondary | ICD-10-CM | POA: Diagnosis not present

## 2018-10-09 DIAGNOSIS — I69321 Dysphasia following cerebral infarction: Secondary | ICD-10-CM | POA: Diagnosis not present

## 2018-10-09 DIAGNOSIS — R1310 Dysphagia, unspecified: Secondary | ICD-10-CM | POA: Diagnosis present

## 2018-10-09 DIAGNOSIS — R0689 Other abnormalities of breathing: Secondary | ICD-10-CM | POA: Diagnosis not present

## 2018-10-09 DIAGNOSIS — E870 Hyperosmolality and hypernatremia: Secondary | ICD-10-CM | POA: Diagnosis present

## 2018-10-09 DIAGNOSIS — Z1612 Extended spectrum beta lactamase (ESBL) resistance: Secondary | ICD-10-CM | POA: Diagnosis not present

## 2018-10-09 DIAGNOSIS — I69391 Dysphagia following cerebral infarction: Secondary | ICD-10-CM | POA: Diagnosis not present

## 2018-10-09 DIAGNOSIS — Z7901 Long term (current) use of anticoagulants: Secondary | ICD-10-CM | POA: Diagnosis not present

## 2018-10-09 DIAGNOSIS — N210 Calculus in bladder: Secondary | ICD-10-CM | POA: Diagnosis not present

## 2018-10-09 DIAGNOSIS — I69369 Other paralytic syndrome following cerebral infarction affecting unspecified side: Secondary | ICD-10-CM | POA: Diagnosis not present

## 2018-10-16 DIAGNOSIS — F329 Major depressive disorder, single episode, unspecified: Secondary | ICD-10-CM | POA: Diagnosis not present

## 2018-10-16 DIAGNOSIS — Z8673 Personal history of transient ischemic attack (TIA), and cerebral infarction without residual deficits: Secondary | ICD-10-CM | POA: Diagnosis not present

## 2018-10-16 DIAGNOSIS — I69322 Dysarthria following cerebral infarction: Secondary | ICD-10-CM | POA: Diagnosis not present

## 2018-10-16 DIAGNOSIS — R278 Other lack of coordination: Secondary | ICD-10-CM | POA: Diagnosis not present

## 2018-10-16 DIAGNOSIS — Z7901 Long term (current) use of anticoagulants: Secondary | ICD-10-CM | POA: Diagnosis not present

## 2018-10-16 DIAGNOSIS — A4151 Sepsis due to Escherichia coli [E. coli]: Secondary | ICD-10-CM | POA: Diagnosis not present

## 2018-10-16 DIAGNOSIS — G629 Polyneuropathy, unspecified: Secondary | ICD-10-CM | POA: Diagnosis not present

## 2018-10-16 DIAGNOSIS — I69391 Dysphagia following cerebral infarction: Secondary | ICD-10-CM | POA: Diagnosis not present

## 2018-10-16 DIAGNOSIS — Z931 Gastrostomy status: Secondary | ICD-10-CM | POA: Diagnosis not present

## 2018-10-16 DIAGNOSIS — N3001 Acute cystitis with hematuria: Secondary | ICD-10-CM | POA: Diagnosis not present

## 2018-10-16 DIAGNOSIS — M2550 Pain in unspecified joint: Secondary | ICD-10-CM | POA: Diagnosis not present

## 2018-10-16 DIAGNOSIS — M6281 Muscle weakness (generalized): Secondary | ICD-10-CM | POA: Diagnosis not present

## 2018-10-16 DIAGNOSIS — B961 Klebsiella pneumoniae [K. pneumoniae] as the cause of diseases classified elsewhere: Secondary | ICD-10-CM | POA: Diagnosis not present

## 2018-10-16 DIAGNOSIS — L97429 Non-pressure chronic ulcer of left heel and midfoot with unspecified severity: Secondary | ICD-10-CM | POA: Diagnosis not present

## 2018-10-16 DIAGNOSIS — M6250 Muscle wasting and atrophy, not elsewhere classified, unspecified site: Secondary | ICD-10-CM | POA: Diagnosis not present

## 2018-10-16 DIAGNOSIS — Z87440 Personal history of urinary (tract) infections: Secondary | ICD-10-CM | POA: Diagnosis not present

## 2018-10-16 DIAGNOSIS — I2510 Atherosclerotic heart disease of native coronary artery without angina pectoris: Secondary | ICD-10-CM | POA: Diagnosis not present

## 2018-10-16 DIAGNOSIS — R569 Unspecified convulsions: Secondary | ICD-10-CM | POA: Diagnosis not present

## 2018-10-16 DIAGNOSIS — I5020 Unspecified systolic (congestive) heart failure: Secondary | ICD-10-CM | POA: Diagnosis not present

## 2018-10-16 DIAGNOSIS — Z7401 Bed confinement status: Secondary | ICD-10-CM | POA: Diagnosis not present

## 2018-10-16 DIAGNOSIS — B964 Proteus (mirabilis) (morganii) as the cause of diseases classified elsewhere: Secondary | ICD-10-CM | POA: Diagnosis not present

## 2018-10-16 DIAGNOSIS — I5022 Chronic systolic (congestive) heart failure: Secondary | ICD-10-CM | POA: Diagnosis not present

## 2018-10-16 DIAGNOSIS — N390 Urinary tract infection, site not specified: Secondary | ICD-10-CM | POA: Diagnosis not present

## 2018-10-16 DIAGNOSIS — G459 Transient cerebral ischemic attack, unspecified: Secondary | ICD-10-CM | POA: Diagnosis not present

## 2018-10-16 DIAGNOSIS — I959 Hypotension, unspecified: Secondary | ICD-10-CM | POA: Diagnosis not present

## 2018-10-16 DIAGNOSIS — E119 Type 2 diabetes mellitus without complications: Secondary | ICD-10-CM | POA: Diagnosis not present

## 2018-10-16 DIAGNOSIS — R339 Retention of urine, unspecified: Secondary | ICD-10-CM | POA: Diagnosis not present

## 2018-10-16 DIAGNOSIS — A499 Bacterial infection, unspecified: Secondary | ICD-10-CM | POA: Diagnosis not present

## 2018-10-16 DIAGNOSIS — I10 Essential (primary) hypertension: Secondary | ICD-10-CM | POA: Diagnosis not present

## 2018-10-16 DIAGNOSIS — L89893 Pressure ulcer of other site, stage 3: Secondary | ICD-10-CM | POA: Diagnosis not present

## 2018-10-16 DIAGNOSIS — R4701 Aphasia: Secondary | ICD-10-CM | POA: Diagnosis not present

## 2018-10-16 DIAGNOSIS — N210 Calculus in bladder: Secondary | ICD-10-CM | POA: Diagnosis not present

## 2018-10-16 DIAGNOSIS — D649 Anemia, unspecified: Secondary | ICD-10-CM | POA: Diagnosis not present

## 2018-10-16 DIAGNOSIS — N319 Neuromuscular dysfunction of bladder, unspecified: Secondary | ICD-10-CM | POA: Diagnosis not present

## 2018-10-16 DIAGNOSIS — Z86718 Personal history of other venous thrombosis and embolism: Secondary | ICD-10-CM | POA: Diagnosis not present

## 2018-10-16 DIAGNOSIS — I69920 Aphasia following unspecified cerebrovascular disease: Secondary | ICD-10-CM | POA: Diagnosis not present

## 2018-10-16 DIAGNOSIS — L97129 Non-pressure chronic ulcer of left thigh with unspecified severity: Secondary | ICD-10-CM | POA: Diagnosis not present

## 2018-10-16 DIAGNOSIS — Z960 Presence of urogenital implants: Secondary | ICD-10-CM | POA: Diagnosis not present

## 2018-10-16 DIAGNOSIS — A419 Sepsis, unspecified organism: Secondary | ICD-10-CM | POA: Diagnosis not present

## 2018-10-16 DIAGNOSIS — Z1612 Extended spectrum beta lactamase (ESBL) resistance: Secondary | ICD-10-CM | POA: Diagnosis not present

## 2018-10-18 DIAGNOSIS — I10 Essential (primary) hypertension: Secondary | ICD-10-CM | POA: Diagnosis not present

## 2018-10-18 DIAGNOSIS — Z87440 Personal history of urinary (tract) infections: Secondary | ICD-10-CM | POA: Diagnosis not present

## 2018-10-18 DIAGNOSIS — I5020 Unspecified systolic (congestive) heart failure: Secondary | ICD-10-CM | POA: Diagnosis not present

## 2018-10-18 DIAGNOSIS — Z1612 Extended spectrum beta lactamase (ESBL) resistance: Secondary | ICD-10-CM | POA: Diagnosis not present

## 2018-10-19 DIAGNOSIS — I10 Essential (primary) hypertension: Secondary | ICD-10-CM | POA: Diagnosis not present

## 2018-10-19 DIAGNOSIS — E119 Type 2 diabetes mellitus without complications: Secondary | ICD-10-CM | POA: Diagnosis not present

## 2018-10-19 DIAGNOSIS — L89893 Pressure ulcer of other site, stage 3: Secondary | ICD-10-CM | POA: Diagnosis not present

## 2018-10-19 DIAGNOSIS — I2510 Atherosclerotic heart disease of native coronary artery without angina pectoris: Secondary | ICD-10-CM | POA: Diagnosis not present

## 2018-10-19 DIAGNOSIS — I5020 Unspecified systolic (congestive) heart failure: Secondary | ICD-10-CM | POA: Diagnosis not present

## 2018-10-19 DIAGNOSIS — L97429 Non-pressure chronic ulcer of left heel and midfoot with unspecified severity: Secondary | ICD-10-CM | POA: Diagnosis not present

## 2018-10-25 DIAGNOSIS — A499 Bacterial infection, unspecified: Secondary | ICD-10-CM | POA: Diagnosis not present

## 2018-10-25 DIAGNOSIS — A419 Sepsis, unspecified organism: Secondary | ICD-10-CM | POA: Diagnosis not present

## 2018-10-25 DIAGNOSIS — E119 Type 2 diabetes mellitus without complications: Secondary | ICD-10-CM | POA: Diagnosis not present

## 2018-10-25 DIAGNOSIS — Z87440 Personal history of urinary (tract) infections: Secondary | ICD-10-CM | POA: Diagnosis not present

## 2018-10-26 DIAGNOSIS — L89893 Pressure ulcer of other site, stage 3: Secondary | ICD-10-CM | POA: Diagnosis not present

## 2018-10-26 DIAGNOSIS — L97129 Non-pressure chronic ulcer of left thigh with unspecified severity: Secondary | ICD-10-CM | POA: Diagnosis not present

## 2018-10-26 DIAGNOSIS — L97429 Non-pressure chronic ulcer of left heel and midfoot with unspecified severity: Secondary | ICD-10-CM | POA: Diagnosis not present

## 2018-10-27 DIAGNOSIS — Z1612 Extended spectrum beta lactamase (ESBL) resistance: Secondary | ICD-10-CM | POA: Diagnosis not present

## 2018-10-27 DIAGNOSIS — Z87440 Personal history of urinary (tract) infections: Secondary | ICD-10-CM | POA: Diagnosis not present

## 2018-10-27 DIAGNOSIS — A499 Bacterial infection, unspecified: Secondary | ICD-10-CM | POA: Diagnosis not present

## 2018-10-27 DIAGNOSIS — A4151 Sepsis due to Escherichia coli [E. coli]: Secondary | ICD-10-CM | POA: Diagnosis not present

## 2018-11-02 DIAGNOSIS — A419 Sepsis, unspecified organism: Secondary | ICD-10-CM | POA: Diagnosis not present

## 2018-11-02 DIAGNOSIS — L97429 Non-pressure chronic ulcer of left heel and midfoot with unspecified severity: Secondary | ICD-10-CM | POA: Diagnosis not present

## 2018-11-02 DIAGNOSIS — I10 Essential (primary) hypertension: Secondary | ICD-10-CM | POA: Diagnosis not present

## 2018-11-02 DIAGNOSIS — E119 Type 2 diabetes mellitus without complications: Secondary | ICD-10-CM | POA: Diagnosis not present

## 2018-11-02 DIAGNOSIS — L89893 Pressure ulcer of other site, stage 3: Secondary | ICD-10-CM | POA: Diagnosis not present

## 2018-11-02 DIAGNOSIS — I5020 Unspecified systolic (congestive) heart failure: Secondary | ICD-10-CM | POA: Diagnosis not present

## 2018-11-08 DIAGNOSIS — Z7901 Long term (current) use of anticoagulants: Secondary | ICD-10-CM | POA: Diagnosis not present

## 2018-11-08 DIAGNOSIS — L8990 Pressure ulcer of unspecified site, unspecified stage: Secondary | ICD-10-CM | POA: Diagnosis not present

## 2018-11-08 DIAGNOSIS — E119 Type 2 diabetes mellitus without complications: Secondary | ICD-10-CM | POA: Diagnosis not present

## 2018-11-08 DIAGNOSIS — I10 Essential (primary) hypertension: Secondary | ICD-10-CM | POA: Diagnosis not present

## 2018-11-08 DIAGNOSIS — I5022 Chronic systolic (congestive) heart failure: Secondary | ICD-10-CM | POA: Diagnosis not present

## 2018-11-12 DIAGNOSIS — E559 Vitamin D deficiency, unspecified: Secondary | ICD-10-CM | POA: Diagnosis not present

## 2018-11-12 DIAGNOSIS — Z931 Gastrostomy status: Secondary | ICD-10-CM | POA: Diagnosis not present

## 2018-11-12 DIAGNOSIS — Z8673 Personal history of transient ischemic attack (TIA), and cerebral infarction without residual deficits: Secondary | ICD-10-CM | POA: Diagnosis not present

## 2018-11-12 DIAGNOSIS — I110 Hypertensive heart disease with heart failure: Secondary | ICD-10-CM | POA: Diagnosis not present

## 2018-11-12 DIAGNOSIS — Z8701 Personal history of pneumonia (recurrent): Secondary | ICD-10-CM | POA: Diagnosis not present

## 2018-11-12 DIAGNOSIS — Z7401 Bed confinement status: Secondary | ICD-10-CM | POA: Diagnosis not present

## 2018-11-12 DIAGNOSIS — F419 Anxiety disorder, unspecified: Secondary | ICD-10-CM | POA: Diagnosis not present

## 2018-11-12 DIAGNOSIS — L89892 Pressure ulcer of other site, stage 2: Secondary | ICD-10-CM | POA: Diagnosis not present

## 2018-11-12 DIAGNOSIS — Z9181 History of falling: Secondary | ICD-10-CM | POA: Diagnosis not present

## 2018-11-12 DIAGNOSIS — Z86718 Personal history of other venous thrombosis and embolism: Secondary | ICD-10-CM | POA: Diagnosis not present

## 2018-11-12 DIAGNOSIS — L89312 Pressure ulcer of right buttock, stage 2: Secondary | ICD-10-CM | POA: Diagnosis not present

## 2018-11-12 DIAGNOSIS — N210 Calculus in bladder: Secondary | ICD-10-CM | POA: Diagnosis not present

## 2018-11-12 DIAGNOSIS — D649 Anemia, unspecified: Secondary | ICD-10-CM | POA: Diagnosis not present

## 2018-11-12 DIAGNOSIS — I2510 Atherosclerotic heart disease of native coronary artery without angina pectoris: Secondary | ICD-10-CM | POA: Diagnosis not present

## 2018-11-12 DIAGNOSIS — E1140 Type 2 diabetes mellitus with diabetic neuropathy, unspecified: Secondary | ICD-10-CM | POA: Diagnosis not present

## 2018-11-12 DIAGNOSIS — L89322 Pressure ulcer of left buttock, stage 2: Secondary | ICD-10-CM | POA: Diagnosis not present

## 2018-11-12 DIAGNOSIS — N319 Neuromuscular dysfunction of bladder, unspecified: Secondary | ICD-10-CM | POA: Diagnosis not present

## 2018-11-12 DIAGNOSIS — Z951 Presence of aortocoronary bypass graft: Secondary | ICD-10-CM | POA: Diagnosis not present

## 2018-11-12 DIAGNOSIS — I5022 Chronic systolic (congestive) heart failure: Secondary | ICD-10-CM | POA: Diagnosis not present

## 2018-11-12 DIAGNOSIS — Z7901 Long term (current) use of anticoagulants: Secondary | ICD-10-CM | POA: Diagnosis not present

## 2018-11-12 DIAGNOSIS — Z435 Encounter for attention to cystostomy: Secondary | ICD-10-CM | POA: Diagnosis not present

## 2018-11-12 DIAGNOSIS — Z87440 Personal history of urinary (tract) infections: Secondary | ICD-10-CM | POA: Diagnosis not present

## 2018-11-12 DIAGNOSIS — F329 Major depressive disorder, single episode, unspecified: Secondary | ICD-10-CM | POA: Diagnosis not present

## 2018-11-16 DIAGNOSIS — L89322 Pressure ulcer of left buttock, stage 2: Secondary | ICD-10-CM | POA: Diagnosis not present

## 2018-11-16 DIAGNOSIS — L89892 Pressure ulcer of other site, stage 2: Secondary | ICD-10-CM | POA: Diagnosis not present

## 2018-11-16 DIAGNOSIS — E1140 Type 2 diabetes mellitus with diabetic neuropathy, unspecified: Secondary | ICD-10-CM | POA: Diagnosis not present

## 2018-11-16 DIAGNOSIS — I110 Hypertensive heart disease with heart failure: Secondary | ICD-10-CM | POA: Diagnosis not present

## 2018-11-16 DIAGNOSIS — L89312 Pressure ulcer of right buttock, stage 2: Secondary | ICD-10-CM | POA: Diagnosis not present

## 2018-11-16 DIAGNOSIS — I5022 Chronic systolic (congestive) heart failure: Secondary | ICD-10-CM | POA: Diagnosis not present

## 2018-11-18 DIAGNOSIS — L89312 Pressure ulcer of right buttock, stage 2: Secondary | ICD-10-CM | POA: Diagnosis not present

## 2018-11-18 DIAGNOSIS — L89322 Pressure ulcer of left buttock, stage 2: Secondary | ICD-10-CM | POA: Diagnosis not present

## 2018-11-18 DIAGNOSIS — I110 Hypertensive heart disease with heart failure: Secondary | ICD-10-CM | POA: Diagnosis not present

## 2018-11-18 DIAGNOSIS — I5022 Chronic systolic (congestive) heart failure: Secondary | ICD-10-CM | POA: Diagnosis not present

## 2018-11-18 DIAGNOSIS — E1140 Type 2 diabetes mellitus with diabetic neuropathy, unspecified: Secondary | ICD-10-CM | POA: Diagnosis not present

## 2018-11-18 DIAGNOSIS — L89892 Pressure ulcer of other site, stage 2: Secondary | ICD-10-CM | POA: Diagnosis not present

## 2018-11-23 DIAGNOSIS — L89312 Pressure ulcer of right buttock, stage 2: Secondary | ICD-10-CM | POA: Diagnosis not present

## 2018-11-23 DIAGNOSIS — I5022 Chronic systolic (congestive) heart failure: Secondary | ICD-10-CM | POA: Diagnosis not present

## 2018-11-23 DIAGNOSIS — I110 Hypertensive heart disease with heart failure: Secondary | ICD-10-CM | POA: Diagnosis not present

## 2018-11-23 DIAGNOSIS — L89322 Pressure ulcer of left buttock, stage 2: Secondary | ICD-10-CM | POA: Diagnosis not present

## 2018-11-23 DIAGNOSIS — E1140 Type 2 diabetes mellitus with diabetic neuropathy, unspecified: Secondary | ICD-10-CM | POA: Diagnosis not present

## 2018-11-23 DIAGNOSIS — L89892 Pressure ulcer of other site, stage 2: Secondary | ICD-10-CM | POA: Diagnosis not present

## 2018-11-25 DIAGNOSIS — L89312 Pressure ulcer of right buttock, stage 2: Secondary | ICD-10-CM | POA: Diagnosis not present

## 2018-11-25 DIAGNOSIS — L89892 Pressure ulcer of other site, stage 2: Secondary | ICD-10-CM | POA: Diagnosis not present

## 2018-11-25 DIAGNOSIS — L89322 Pressure ulcer of left buttock, stage 2: Secondary | ICD-10-CM | POA: Diagnosis not present

## 2018-11-25 DIAGNOSIS — I5022 Chronic systolic (congestive) heart failure: Secondary | ICD-10-CM | POA: Diagnosis not present

## 2018-11-25 DIAGNOSIS — I110 Hypertensive heart disease with heart failure: Secondary | ICD-10-CM | POA: Diagnosis not present

## 2018-11-25 DIAGNOSIS — E1140 Type 2 diabetes mellitus with diabetic neuropathy, unspecified: Secondary | ICD-10-CM | POA: Diagnosis not present

## 2018-11-30 DIAGNOSIS — L89322 Pressure ulcer of left buttock, stage 2: Secondary | ICD-10-CM | POA: Diagnosis not present

## 2018-11-30 DIAGNOSIS — I5022 Chronic systolic (congestive) heart failure: Secondary | ICD-10-CM | POA: Diagnosis not present

## 2018-11-30 DIAGNOSIS — I110 Hypertensive heart disease with heart failure: Secondary | ICD-10-CM | POA: Diagnosis not present

## 2018-11-30 DIAGNOSIS — E1140 Type 2 diabetes mellitus with diabetic neuropathy, unspecified: Secondary | ICD-10-CM | POA: Diagnosis not present

## 2018-11-30 DIAGNOSIS — L89892 Pressure ulcer of other site, stage 2: Secondary | ICD-10-CM | POA: Diagnosis not present

## 2018-11-30 DIAGNOSIS — L89312 Pressure ulcer of right buttock, stage 2: Secondary | ICD-10-CM | POA: Diagnosis not present

## 2018-12-02 DIAGNOSIS — I110 Hypertensive heart disease with heart failure: Secondary | ICD-10-CM | POA: Diagnosis not present

## 2018-12-02 DIAGNOSIS — I5022 Chronic systolic (congestive) heart failure: Secondary | ICD-10-CM | POA: Diagnosis not present

## 2018-12-02 DIAGNOSIS — L89892 Pressure ulcer of other site, stage 2: Secondary | ICD-10-CM | POA: Diagnosis not present

## 2018-12-02 DIAGNOSIS — L89322 Pressure ulcer of left buttock, stage 2: Secondary | ICD-10-CM | POA: Diagnosis not present

## 2018-12-02 DIAGNOSIS — E1140 Type 2 diabetes mellitus with diabetic neuropathy, unspecified: Secondary | ICD-10-CM | POA: Diagnosis not present

## 2018-12-02 DIAGNOSIS — L89312 Pressure ulcer of right buttock, stage 2: Secondary | ICD-10-CM | POA: Diagnosis not present

## 2018-12-06 ENCOUNTER — Other Ambulatory Visit: Payer: Self-pay

## 2018-12-10 DIAGNOSIS — L89892 Pressure ulcer of other site, stage 2: Secondary | ICD-10-CM | POA: Diagnosis not present

## 2018-12-10 DIAGNOSIS — I110 Hypertensive heart disease with heart failure: Secondary | ICD-10-CM | POA: Diagnosis not present

## 2018-12-10 DIAGNOSIS — L89312 Pressure ulcer of right buttock, stage 2: Secondary | ICD-10-CM | POA: Diagnosis not present

## 2018-12-10 DIAGNOSIS — E1140 Type 2 diabetes mellitus with diabetic neuropathy, unspecified: Secondary | ICD-10-CM | POA: Diagnosis not present

## 2018-12-10 DIAGNOSIS — L89322 Pressure ulcer of left buttock, stage 2: Secondary | ICD-10-CM | POA: Diagnosis not present

## 2018-12-10 DIAGNOSIS — I5022 Chronic systolic (congestive) heart failure: Secondary | ICD-10-CM | POA: Diagnosis not present

## 2018-12-12 DIAGNOSIS — Z8673 Personal history of transient ischemic attack (TIA), and cerebral infarction without residual deficits: Secondary | ICD-10-CM | POA: Diagnosis not present

## 2018-12-12 DIAGNOSIS — N319 Neuromuscular dysfunction of bladder, unspecified: Secondary | ICD-10-CM | POA: Diagnosis not present

## 2018-12-12 DIAGNOSIS — E1140 Type 2 diabetes mellitus with diabetic neuropathy, unspecified: Secondary | ICD-10-CM | POA: Diagnosis not present

## 2018-12-12 DIAGNOSIS — Z9181 History of falling: Secondary | ICD-10-CM | POA: Diagnosis not present

## 2018-12-12 DIAGNOSIS — Z86718 Personal history of other venous thrombosis and embolism: Secondary | ICD-10-CM | POA: Diagnosis not present

## 2018-12-12 DIAGNOSIS — Z87440 Personal history of urinary (tract) infections: Secondary | ICD-10-CM | POA: Diagnosis not present

## 2018-12-12 DIAGNOSIS — E559 Vitamin D deficiency, unspecified: Secondary | ICD-10-CM | POA: Diagnosis not present

## 2018-12-12 DIAGNOSIS — Z951 Presence of aortocoronary bypass graft: Secondary | ICD-10-CM | POA: Diagnosis not present

## 2018-12-12 DIAGNOSIS — I5022 Chronic systolic (congestive) heart failure: Secondary | ICD-10-CM | POA: Diagnosis not present

## 2018-12-12 DIAGNOSIS — N210 Calculus in bladder: Secondary | ICD-10-CM | POA: Diagnosis not present

## 2018-12-12 DIAGNOSIS — Z7401 Bed confinement status: Secondary | ICD-10-CM | POA: Diagnosis not present

## 2018-12-12 DIAGNOSIS — L89312 Pressure ulcer of right buttock, stage 2: Secondary | ICD-10-CM | POA: Diagnosis not present

## 2018-12-12 DIAGNOSIS — I110 Hypertensive heart disease with heart failure: Secondary | ICD-10-CM | POA: Diagnosis not present

## 2018-12-12 DIAGNOSIS — Z7901 Long term (current) use of anticoagulants: Secondary | ICD-10-CM | POA: Diagnosis not present

## 2018-12-12 DIAGNOSIS — Z8701 Personal history of pneumonia (recurrent): Secondary | ICD-10-CM | POA: Diagnosis not present

## 2018-12-12 DIAGNOSIS — D649 Anemia, unspecified: Secondary | ICD-10-CM | POA: Diagnosis not present

## 2018-12-12 DIAGNOSIS — I2510 Atherosclerotic heart disease of native coronary artery without angina pectoris: Secondary | ICD-10-CM | POA: Diagnosis not present

## 2018-12-12 DIAGNOSIS — F419 Anxiety disorder, unspecified: Secondary | ICD-10-CM | POA: Diagnosis not present

## 2018-12-12 DIAGNOSIS — L89322 Pressure ulcer of left buttock, stage 2: Secondary | ICD-10-CM | POA: Diagnosis not present

## 2018-12-12 DIAGNOSIS — L89892 Pressure ulcer of other site, stage 2: Secondary | ICD-10-CM | POA: Diagnosis not present

## 2018-12-12 DIAGNOSIS — Z435 Encounter for attention to cystostomy: Secondary | ICD-10-CM | POA: Diagnosis not present

## 2018-12-12 DIAGNOSIS — Z931 Gastrostomy status: Secondary | ICD-10-CM | POA: Diagnosis not present

## 2018-12-12 DIAGNOSIS — F329 Major depressive disorder, single episode, unspecified: Secondary | ICD-10-CM | POA: Diagnosis not present

## 2018-12-14 DIAGNOSIS — I5022 Chronic systolic (congestive) heart failure: Secondary | ICD-10-CM | POA: Diagnosis not present

## 2018-12-14 DIAGNOSIS — L89312 Pressure ulcer of right buttock, stage 2: Secondary | ICD-10-CM | POA: Diagnosis not present

## 2018-12-14 DIAGNOSIS — E1140 Type 2 diabetes mellitus with diabetic neuropathy, unspecified: Secondary | ICD-10-CM | POA: Diagnosis not present

## 2018-12-14 DIAGNOSIS — L89322 Pressure ulcer of left buttock, stage 2: Secondary | ICD-10-CM | POA: Diagnosis not present

## 2018-12-14 DIAGNOSIS — I110 Hypertensive heart disease with heart failure: Secondary | ICD-10-CM | POA: Diagnosis not present

## 2018-12-14 DIAGNOSIS — L89892 Pressure ulcer of other site, stage 2: Secondary | ICD-10-CM | POA: Diagnosis not present

## 2019-01-03 DIAGNOSIS — E119 Type 2 diabetes mellitus without complications: Secondary | ICD-10-CM | POA: Diagnosis not present

## 2019-01-03 DIAGNOSIS — K5909 Other constipation: Secondary | ICD-10-CM | POA: Diagnosis not present

## 2019-01-03 DIAGNOSIS — I5022 Chronic systolic (congestive) heart failure: Secondary | ICD-10-CM | POA: Diagnosis not present

## 2019-01-03 DIAGNOSIS — Z931 Gastrostomy status: Secondary | ICD-10-CM | POA: Diagnosis not present

## 2019-01-03 DIAGNOSIS — Z7901 Long term (current) use of anticoagulants: Secondary | ICD-10-CM | POA: Diagnosis not present

## 2019-01-08 DIAGNOSIS — Y998 Other external cause status: Secondary | ICD-10-CM | POA: Diagnosis not present

## 2019-01-08 DIAGNOSIS — I10 Essential (primary) hypertension: Secondary | ICD-10-CM | POA: Diagnosis not present

## 2019-01-08 DIAGNOSIS — N390 Urinary tract infection, site not specified: Secondary | ICD-10-CM | POA: Diagnosis not present

## 2019-01-08 DIAGNOSIS — L89319 Pressure ulcer of right buttock, unspecified stage: Secondary | ICD-10-CM | POA: Diagnosis not present

## 2019-01-08 DIAGNOSIS — R109 Unspecified abdominal pain: Secondary | ICD-10-CM | POA: Diagnosis not present

## 2019-01-08 DIAGNOSIS — G8220 Paraplegia, unspecified: Secondary | ICD-10-CM | POA: Diagnosis not present

## 2019-01-08 DIAGNOSIS — B964 Proteus (mirabilis) (morganii) as the cause of diseases classified elsewhere: Secondary | ICD-10-CM | POA: Diagnosis not present

## 2019-01-08 DIAGNOSIS — Z1623 Resistance to quinolones and fluoroquinolones: Secondary | ICD-10-CM | POA: Diagnosis not present

## 2019-01-08 DIAGNOSIS — T83518A Infection and inflammatory reaction due to other urinary catheter, initial encounter: Secondary | ICD-10-CM | POA: Diagnosis not present

## 2019-01-08 DIAGNOSIS — R58 Hemorrhage, not elsewhere classified: Secondary | ICD-10-CM | POA: Diagnosis not present

## 2019-01-08 DIAGNOSIS — R531 Weakness: Secondary | ICD-10-CM | POA: Diagnosis not present

## 2019-01-08 DIAGNOSIS — L89329 Pressure ulcer of left buttock, unspecified stage: Secondary | ICD-10-CM | POA: Diagnosis not present

## 2019-01-08 DIAGNOSIS — T83010A Breakdown (mechanical) of cystostomy catheter, initial encounter: Secondary | ICD-10-CM | POA: Diagnosis not present

## 2019-01-08 DIAGNOSIS — I2510 Atherosclerotic heart disease of native coronary artery without angina pectoris: Secondary | ICD-10-CM | POA: Diagnosis not present

## 2019-01-08 DIAGNOSIS — K029 Dental caries, unspecified: Secondary | ICD-10-CM | POA: Diagnosis not present

## 2019-01-08 DIAGNOSIS — R404 Transient alteration of awareness: Secondary | ICD-10-CM | POA: Diagnosis not present

## 2019-01-08 DIAGNOSIS — E119 Type 2 diabetes mellitus without complications: Secondary | ICD-10-CM | POA: Diagnosis not present

## 2019-01-08 DIAGNOSIS — Z950 Presence of cardiac pacemaker: Secondary | ICD-10-CM | POA: Diagnosis not present

## 2019-01-08 DIAGNOSIS — R1030 Lower abdominal pain, unspecified: Secondary | ICD-10-CM | POA: Diagnosis not present

## 2019-01-08 DIAGNOSIS — R1084 Generalized abdominal pain: Secondary | ICD-10-CM | POA: Diagnosis not present

## 2019-01-08 DIAGNOSIS — Y846 Urinary catheterization as the cause of abnormal reaction of the patient, or of later complication, without mention of misadventure at the time of the procedure: Secondary | ICD-10-CM | POA: Diagnosis not present

## 2019-01-09 DIAGNOSIS — G40909 Epilepsy, unspecified, not intractable, without status epilepticus: Secondary | ICD-10-CM | POA: Diagnosis not present

## 2019-01-09 DIAGNOSIS — G8220 Paraplegia, unspecified: Secondary | ICD-10-CM | POA: Diagnosis present

## 2019-01-09 DIAGNOSIS — Z7901 Long term (current) use of anticoagulants: Secondary | ICD-10-CM | POA: Diagnosis not present

## 2019-01-09 DIAGNOSIS — Z1623 Resistance to quinolones and fluoroquinolones: Secondary | ICD-10-CM | POA: Diagnosis not present

## 2019-01-09 DIAGNOSIS — E119 Type 2 diabetes mellitus without complications: Secondary | ICD-10-CM | POA: Diagnosis not present

## 2019-01-09 DIAGNOSIS — Z931 Gastrostomy status: Secondary | ICD-10-CM | POA: Diagnosis not present

## 2019-01-09 DIAGNOSIS — T83518A Infection and inflammatory reaction due to other urinary catheter, initial encounter: Secondary | ICD-10-CM | POA: Diagnosis present

## 2019-01-09 DIAGNOSIS — Z79899 Other long term (current) drug therapy: Secondary | ICD-10-CM | POA: Diagnosis not present

## 2019-01-09 DIAGNOSIS — L89159 Pressure ulcer of sacral region, unspecified stage: Secondary | ICD-10-CM | POA: Diagnosis present

## 2019-01-09 DIAGNOSIS — I509 Heart failure, unspecified: Secondary | ICD-10-CM | POA: Diagnosis present

## 2019-01-09 DIAGNOSIS — Z8619 Personal history of other infectious and parasitic diseases: Secondary | ICD-10-CM | POA: Diagnosis not present

## 2019-01-09 DIAGNOSIS — R109 Unspecified abdominal pain: Secondary | ICD-10-CM | POA: Diagnosis not present

## 2019-01-09 DIAGNOSIS — R1084 Generalized abdominal pain: Secondary | ICD-10-CM | POA: Diagnosis not present

## 2019-01-09 DIAGNOSIS — I2510 Atherosclerotic heart disease of native coronary artery without angina pectoris: Secondary | ICD-10-CM | POA: Diagnosis present

## 2019-01-09 DIAGNOSIS — B964 Proteus (mirabilis) (morganii) as the cause of diseases classified elsewhere: Secondary | ICD-10-CM | POA: Diagnosis not present

## 2019-01-09 DIAGNOSIS — I959 Hypotension, unspecified: Secondary | ICD-10-CM | POA: Diagnosis not present

## 2019-01-09 DIAGNOSIS — R279 Unspecified lack of coordination: Secondary | ICD-10-CM | POA: Diagnosis not present

## 2019-01-09 DIAGNOSIS — I10 Essential (primary) hypertension: Secondary | ICD-10-CM | POA: Diagnosis not present

## 2019-01-09 DIAGNOSIS — I69391 Dysphagia following cerebral infarction: Secondary | ICD-10-CM | POA: Diagnosis not present

## 2019-01-09 DIAGNOSIS — Z87891 Personal history of nicotine dependence: Secondary | ICD-10-CM | POA: Diagnosis not present

## 2019-01-09 DIAGNOSIS — Z7401 Bed confinement status: Secondary | ICD-10-CM | POA: Diagnosis not present

## 2019-01-09 DIAGNOSIS — Z87440 Personal history of urinary (tract) infections: Secondary | ICD-10-CM | POA: Diagnosis not present

## 2019-01-09 DIAGNOSIS — I110 Hypertensive heart disease with heart failure: Secondary | ICD-10-CM | POA: Diagnosis present

## 2019-01-09 DIAGNOSIS — Z86718 Personal history of other venous thrombosis and embolism: Secondary | ICD-10-CM | POA: Diagnosis not present

## 2019-01-09 DIAGNOSIS — Z951 Presence of aortocoronary bypass graft: Secondary | ICD-10-CM | POA: Diagnosis not present

## 2019-01-09 DIAGNOSIS — N390 Urinary tract infection, site not specified: Secondary | ICD-10-CM | POA: Diagnosis present

## 2019-01-09 DIAGNOSIS — Z743 Need for continuous supervision: Secondary | ICD-10-CM | POA: Diagnosis not present

## 2019-01-09 DIAGNOSIS — T83511A Infection and inflammatory reaction due to indwelling urethral catheter, initial encounter: Secondary | ICD-10-CM | POA: Diagnosis not present

## 2019-01-09 DIAGNOSIS — R1310 Dysphagia, unspecified: Secondary | ICD-10-CM | POA: Diagnosis present

## 2019-01-12 DIAGNOSIS — M25519 Pain in unspecified shoulder: Secondary | ICD-10-CM | POA: Diagnosis not present

## 2019-01-14 DIAGNOSIS — D649 Anemia, unspecified: Secondary | ICD-10-CM | POA: Diagnosis not present

## 2019-01-14 DIAGNOSIS — I5022 Chronic systolic (congestive) heart failure: Secondary | ICD-10-CM | POA: Diagnosis not present

## 2019-01-14 DIAGNOSIS — H5462 Unqualified visual loss, left eye, normal vision right eye: Secondary | ICD-10-CM | POA: Diagnosis not present

## 2019-01-14 DIAGNOSIS — I4891 Unspecified atrial fibrillation: Secondary | ICD-10-CM | POA: Diagnosis not present

## 2019-01-14 DIAGNOSIS — E1140 Type 2 diabetes mellitus with diabetic neuropathy, unspecified: Secondary | ICD-10-CM | POA: Diagnosis not present

## 2019-01-14 DIAGNOSIS — I110 Hypertensive heart disease with heart failure: Secondary | ICD-10-CM | POA: Diagnosis not present

## 2019-01-14 DIAGNOSIS — E559 Vitamin D deficiency, unspecified: Secondary | ICD-10-CM | POA: Diagnosis not present

## 2019-01-14 DIAGNOSIS — G3184 Mild cognitive impairment, so stated: Secondary | ICD-10-CM | POA: Diagnosis not present

## 2019-01-14 DIAGNOSIS — I69322 Dysarthria following cerebral infarction: Secondary | ICD-10-CM | POA: Diagnosis not present

## 2019-01-14 DIAGNOSIS — E440 Moderate protein-calorie malnutrition: Secondary | ICD-10-CM | POA: Diagnosis not present

## 2019-01-14 DIAGNOSIS — Z87891 Personal history of nicotine dependence: Secondary | ICD-10-CM | POA: Diagnosis not present

## 2019-01-14 DIAGNOSIS — T83510A Infection and inflammatory reaction due to cystostomy catheter, initial encounter: Secondary | ICD-10-CM | POA: Diagnosis not present

## 2019-01-14 DIAGNOSIS — N390 Urinary tract infection, site not specified: Secondary | ICD-10-CM | POA: Diagnosis not present

## 2019-01-14 DIAGNOSIS — G40909 Epilepsy, unspecified, not intractable, without status epilepticus: Secondary | ICD-10-CM | POA: Diagnosis not present

## 2019-01-14 DIAGNOSIS — Z7901 Long term (current) use of anticoagulants: Secondary | ICD-10-CM | POA: Diagnosis not present

## 2019-01-14 DIAGNOSIS — I252 Old myocardial infarction: Secondary | ICD-10-CM | POA: Diagnosis not present

## 2019-01-14 DIAGNOSIS — Z792 Long term (current) use of antibiotics: Secondary | ICD-10-CM | POA: Diagnosis not present

## 2019-01-14 DIAGNOSIS — N319 Neuromuscular dysfunction of bladder, unspecified: Secondary | ICD-10-CM | POA: Diagnosis not present

## 2019-01-14 DIAGNOSIS — I2510 Atherosclerotic heart disease of native coronary artery without angina pectoris: Secondary | ICD-10-CM | POA: Diagnosis not present

## 2019-01-14 DIAGNOSIS — I69351 Hemiplegia and hemiparesis following cerebral infarction affecting right dominant side: Secondary | ICD-10-CM | POA: Diagnosis not present

## 2019-01-14 DIAGNOSIS — E785 Hyperlipidemia, unspecified: Secondary | ICD-10-CM | POA: Diagnosis not present

## 2019-01-14 DIAGNOSIS — Z1612 Extended spectrum beta lactamase (ESBL) resistance: Secondary | ICD-10-CM | POA: Diagnosis not present

## 2019-01-14 DIAGNOSIS — R339 Retention of urine, unspecified: Secondary | ICD-10-CM | POA: Diagnosis not present

## 2019-01-14 DIAGNOSIS — N529 Male erectile dysfunction, unspecified: Secondary | ICD-10-CM | POA: Diagnosis not present

## 2019-01-14 DIAGNOSIS — B964 Proteus (mirabilis) (morganii) as the cause of diseases classified elsewhere: Secondary | ICD-10-CM | POA: Diagnosis not present

## 2019-02-05 DIAGNOSIS — I10 Essential (primary) hypertension: Secondary | ICD-10-CM | POA: Diagnosis not present

## 2019-02-05 DIAGNOSIS — E119 Type 2 diabetes mellitus without complications: Secondary | ICD-10-CM | POA: Diagnosis not present

## 2019-02-05 DIAGNOSIS — K649 Unspecified hemorrhoids: Secondary | ICD-10-CM | POA: Diagnosis not present

## 2019-02-05 DIAGNOSIS — Z7901 Long term (current) use of anticoagulants: Secondary | ICD-10-CM | POA: Diagnosis not present

## 2019-02-05 DIAGNOSIS — Z960 Presence of urogenital implants: Secondary | ICD-10-CM | POA: Diagnosis not present

## 2019-02-13 DIAGNOSIS — E785 Hyperlipidemia, unspecified: Secondary | ICD-10-CM | POA: Diagnosis not present

## 2019-02-13 DIAGNOSIS — N529 Male erectile dysfunction, unspecified: Secondary | ICD-10-CM | POA: Diagnosis not present

## 2019-02-13 DIAGNOSIS — D649 Anemia, unspecified: Secondary | ICD-10-CM | POA: Diagnosis not present

## 2019-02-13 DIAGNOSIS — T83510A Infection and inflammatory reaction due to cystostomy catheter, initial encounter: Secondary | ICD-10-CM | POA: Diagnosis not present

## 2019-02-13 DIAGNOSIS — I252 Old myocardial infarction: Secondary | ICD-10-CM | POA: Diagnosis not present

## 2019-02-13 DIAGNOSIS — Z1612 Extended spectrum beta lactamase (ESBL) resistance: Secondary | ICD-10-CM | POA: Diagnosis not present

## 2019-02-13 DIAGNOSIS — Z7901 Long term (current) use of anticoagulants: Secondary | ICD-10-CM | POA: Diagnosis not present

## 2019-02-13 DIAGNOSIS — I4891 Unspecified atrial fibrillation: Secondary | ICD-10-CM | POA: Diagnosis not present

## 2019-02-13 DIAGNOSIS — Z792 Long term (current) use of antibiotics: Secondary | ICD-10-CM | POA: Diagnosis not present

## 2019-02-13 DIAGNOSIS — I69351 Hemiplegia and hemiparesis following cerebral infarction affecting right dominant side: Secondary | ICD-10-CM | POA: Diagnosis not present

## 2019-02-13 DIAGNOSIS — E559 Vitamin D deficiency, unspecified: Secondary | ICD-10-CM | POA: Diagnosis not present

## 2019-02-13 DIAGNOSIS — E1140 Type 2 diabetes mellitus with diabetic neuropathy, unspecified: Secondary | ICD-10-CM | POA: Diagnosis not present

## 2019-02-13 DIAGNOSIS — Z87891 Personal history of nicotine dependence: Secondary | ICD-10-CM | POA: Diagnosis not present

## 2019-02-13 DIAGNOSIS — G40909 Epilepsy, unspecified, not intractable, without status epilepticus: Secondary | ICD-10-CM | POA: Diagnosis not present

## 2019-02-13 DIAGNOSIS — G3184 Mild cognitive impairment, so stated: Secondary | ICD-10-CM | POA: Diagnosis not present

## 2019-02-13 DIAGNOSIS — R339 Retention of urine, unspecified: Secondary | ICD-10-CM | POA: Diagnosis not present

## 2019-02-13 DIAGNOSIS — E440 Moderate protein-calorie malnutrition: Secondary | ICD-10-CM | POA: Diagnosis not present

## 2019-02-13 DIAGNOSIS — N319 Neuromuscular dysfunction of bladder, unspecified: Secondary | ICD-10-CM | POA: Diagnosis not present

## 2019-02-13 DIAGNOSIS — I2510 Atherosclerotic heart disease of native coronary artery without angina pectoris: Secondary | ICD-10-CM | POA: Diagnosis not present

## 2019-02-13 DIAGNOSIS — I69322 Dysarthria following cerebral infarction: Secondary | ICD-10-CM | POA: Diagnosis not present

## 2019-02-13 DIAGNOSIS — H5462 Unqualified visual loss, left eye, normal vision right eye: Secondary | ICD-10-CM | POA: Diagnosis not present

## 2019-02-13 DIAGNOSIS — B964 Proteus (mirabilis) (morganii) as the cause of diseases classified elsewhere: Secondary | ICD-10-CM | POA: Diagnosis not present

## 2019-02-13 DIAGNOSIS — I5022 Chronic systolic (congestive) heart failure: Secondary | ICD-10-CM | POA: Diagnosis not present

## 2019-02-13 DIAGNOSIS — I110 Hypertensive heart disease with heart failure: Secondary | ICD-10-CM | POA: Diagnosis not present

## 2019-02-13 DIAGNOSIS — N390 Urinary tract infection, site not specified: Secondary | ICD-10-CM | POA: Diagnosis not present

## 2019-02-22 DIAGNOSIS — I110 Hypertensive heart disease with heart failure: Secondary | ICD-10-CM | POA: Diagnosis not present

## 2019-02-22 DIAGNOSIS — T83510A Infection and inflammatory reaction due to cystostomy catheter, initial encounter: Secondary | ICD-10-CM | POA: Diagnosis not present

## 2019-02-22 DIAGNOSIS — N390 Urinary tract infection, site not specified: Secondary | ICD-10-CM | POA: Diagnosis not present

## 2019-02-22 DIAGNOSIS — I69351 Hemiplegia and hemiparesis following cerebral infarction affecting right dominant side: Secondary | ICD-10-CM | POA: Diagnosis not present

## 2019-02-22 DIAGNOSIS — B964 Proteus (mirabilis) (morganii) as the cause of diseases classified elsewhere: Secondary | ICD-10-CM | POA: Diagnosis not present

## 2019-02-22 DIAGNOSIS — E1140 Type 2 diabetes mellitus with diabetic neuropathy, unspecified: Secondary | ICD-10-CM | POA: Diagnosis not present

## 2019-03-11 DIAGNOSIS — B964 Proteus (mirabilis) (morganii) as the cause of diseases classified elsewhere: Secondary | ICD-10-CM | POA: Diagnosis not present

## 2019-03-11 DIAGNOSIS — I110 Hypertensive heart disease with heart failure: Secondary | ICD-10-CM | POA: Diagnosis not present

## 2019-03-11 DIAGNOSIS — E1140 Type 2 diabetes mellitus with diabetic neuropathy, unspecified: Secondary | ICD-10-CM | POA: Diagnosis not present

## 2019-03-11 DIAGNOSIS — T83510A Infection and inflammatory reaction due to cystostomy catheter, initial encounter: Secondary | ICD-10-CM | POA: Diagnosis not present

## 2019-03-11 DIAGNOSIS — I69351 Hemiplegia and hemiparesis following cerebral infarction affecting right dominant side: Secondary | ICD-10-CM | POA: Diagnosis not present

## 2019-03-11 DIAGNOSIS — N390 Urinary tract infection, site not specified: Secondary | ICD-10-CM | POA: Diagnosis not present

## 2019-03-14 DIAGNOSIS — T83510A Infection and inflammatory reaction due to cystostomy catheter, initial encounter: Secondary | ICD-10-CM | POA: Diagnosis not present

## 2019-03-14 DIAGNOSIS — N390 Urinary tract infection, site not specified: Secondary | ICD-10-CM | POA: Diagnosis not present

## 2019-03-14 DIAGNOSIS — E1140 Type 2 diabetes mellitus with diabetic neuropathy, unspecified: Secondary | ICD-10-CM | POA: Diagnosis not present

## 2019-03-14 DIAGNOSIS — B964 Proteus (mirabilis) (morganii) as the cause of diseases classified elsewhere: Secondary | ICD-10-CM | POA: Diagnosis not present

## 2019-03-14 DIAGNOSIS — I69351 Hemiplegia and hemiparesis following cerebral infarction affecting right dominant side: Secondary | ICD-10-CM | POA: Diagnosis not present

## 2019-03-14 DIAGNOSIS — I110 Hypertensive heart disease with heart failure: Secondary | ICD-10-CM | POA: Diagnosis not present

## 2019-03-15 DIAGNOSIS — G40909 Epilepsy, unspecified, not intractable, without status epilepticus: Secondary | ICD-10-CM | POA: Diagnosis not present

## 2019-03-15 DIAGNOSIS — I4891 Unspecified atrial fibrillation: Secondary | ICD-10-CM | POA: Diagnosis not present

## 2019-03-15 DIAGNOSIS — N529 Male erectile dysfunction, unspecified: Secondary | ICD-10-CM | POA: Diagnosis not present

## 2019-03-15 DIAGNOSIS — Z7901 Long term (current) use of anticoagulants: Secondary | ICD-10-CM | POA: Diagnosis not present

## 2019-03-15 DIAGNOSIS — E440 Moderate protein-calorie malnutrition: Secondary | ICD-10-CM | POA: Diagnosis not present

## 2019-03-15 DIAGNOSIS — R339 Retention of urine, unspecified: Secondary | ICD-10-CM | POA: Diagnosis not present

## 2019-03-15 DIAGNOSIS — G3184 Mild cognitive impairment, so stated: Secondary | ICD-10-CM | POA: Diagnosis not present

## 2019-03-15 DIAGNOSIS — I2510 Atherosclerotic heart disease of native coronary artery without angina pectoris: Secondary | ICD-10-CM | POA: Diagnosis not present

## 2019-03-15 DIAGNOSIS — Z9181 History of falling: Secondary | ICD-10-CM | POA: Diagnosis not present

## 2019-03-15 DIAGNOSIS — E785 Hyperlipidemia, unspecified: Secondary | ICD-10-CM | POA: Diagnosis not present

## 2019-03-15 DIAGNOSIS — Z435 Encounter for attention to cystostomy: Secondary | ICD-10-CM | POA: Diagnosis not present

## 2019-03-15 DIAGNOSIS — N319 Neuromuscular dysfunction of bladder, unspecified: Secondary | ICD-10-CM | POA: Diagnosis not present

## 2019-03-15 DIAGNOSIS — Z1612 Extended spectrum beta lactamase (ESBL) resistance: Secondary | ICD-10-CM | POA: Diagnosis not present

## 2019-03-15 DIAGNOSIS — I110 Hypertensive heart disease with heart failure: Secondary | ICD-10-CM | POA: Diagnosis not present

## 2019-03-15 DIAGNOSIS — Z993 Dependence on wheelchair: Secondary | ICD-10-CM | POA: Diagnosis not present

## 2019-03-15 DIAGNOSIS — I252 Old myocardial infarction: Secondary | ICD-10-CM | POA: Diagnosis not present

## 2019-03-15 DIAGNOSIS — E559 Vitamin D deficiency, unspecified: Secondary | ICD-10-CM | POA: Diagnosis not present

## 2019-03-15 DIAGNOSIS — I5022 Chronic systolic (congestive) heart failure: Secondary | ICD-10-CM | POA: Diagnosis not present

## 2019-03-15 DIAGNOSIS — I69351 Hemiplegia and hemiparesis following cerebral infarction affecting right dominant side: Secondary | ICD-10-CM | POA: Diagnosis not present

## 2019-03-15 DIAGNOSIS — H5462 Unqualified visual loss, left eye, normal vision right eye: Secondary | ICD-10-CM | POA: Diagnosis not present

## 2019-03-15 DIAGNOSIS — D649 Anemia, unspecified: Secondary | ICD-10-CM | POA: Diagnosis not present

## 2019-03-15 DIAGNOSIS — E1140 Type 2 diabetes mellitus with diabetic neuropathy, unspecified: Secondary | ICD-10-CM | POA: Diagnosis not present

## 2019-03-15 DIAGNOSIS — I69322 Dysarthria following cerebral infarction: Secondary | ICD-10-CM | POA: Diagnosis not present

## 2019-03-15 DIAGNOSIS — Z87891 Personal history of nicotine dependence: Secondary | ICD-10-CM | POA: Diagnosis not present

## 2019-04-08 DIAGNOSIS — I69351 Hemiplegia and hemiparesis following cerebral infarction affecting right dominant side: Secondary | ICD-10-CM | POA: Diagnosis not present

## 2019-04-08 DIAGNOSIS — E1140 Type 2 diabetes mellitus with diabetic neuropathy, unspecified: Secondary | ICD-10-CM | POA: Diagnosis not present

## 2019-04-08 DIAGNOSIS — I110 Hypertensive heart disease with heart failure: Secondary | ICD-10-CM | POA: Diagnosis not present

## 2019-04-08 DIAGNOSIS — Z435 Encounter for attention to cystostomy: Secondary | ICD-10-CM | POA: Diagnosis not present

## 2019-04-08 DIAGNOSIS — I69322 Dysarthria following cerebral infarction: Secondary | ICD-10-CM | POA: Diagnosis not present

## 2019-04-08 DIAGNOSIS — I5022 Chronic systolic (congestive) heart failure: Secondary | ICD-10-CM | POA: Diagnosis not present

## 2023-04-14 ENCOUNTER — Other Ambulatory Visit: Payer: Self-pay
